# Patient Record
Sex: Female | Born: 1950
Health system: Southern US, Community
[De-identification: ages and names within clinical notes are randomized; demographics above are authoritative.]

## PROBLEM LIST (undated history)

## (undated) DIAGNOSIS — C801 Malignant (primary) neoplasm, unspecified: Secondary | ICD-10-CM

## (undated) DIAGNOSIS — E0500 Thyrotoxicosis with diffuse goiter without thyrotoxic crisis or storm: Secondary | ICD-10-CM

## (undated) DIAGNOSIS — G4700 Insomnia, unspecified: Secondary | ICD-10-CM

## (undated) DIAGNOSIS — M1990 Unspecified osteoarthritis, unspecified site: Secondary | ICD-10-CM

## (undated) DIAGNOSIS — M549 Dorsalgia, unspecified: Secondary | ICD-10-CM

## (undated) DIAGNOSIS — E0590 Thyrotoxicosis, unspecified without thyrotoxic crisis or storm: Secondary | ICD-10-CM

## (undated) DIAGNOSIS — G43909 Migraine, unspecified, not intractable, without status migrainosus: Secondary | ICD-10-CM

## (undated) HISTORY — PX: ABDOMINAL HYSTERECTOMY: SHX81

## (undated) HISTORY — PX: TONSILLECTOMY: SUR1361

## (undated) HISTORY — PX: TYMPANOSTOMY TUBE PLACEMENT: SHX32

## (undated) HISTORY — PX: ELBOW FRACTURE SURGERY: SHX616

---

## 1997-10-24 ENCOUNTER — Observation Stay (HOSPITAL_COMMUNITY): Admission: RE | Admit: 1997-10-24 | Discharge: 1997-10-25 | Payer: Self-pay | Admitting: Obstetrics & Gynecology

## 1998-10-31 ENCOUNTER — Ambulatory Visit (HOSPITAL_BASED_OUTPATIENT_CLINIC_OR_DEPARTMENT_OTHER): Admission: RE | Admit: 1998-10-31 | Discharge: 1998-10-31 | Payer: Self-pay | Admitting: *Deleted

## 1999-10-13 ENCOUNTER — Ambulatory Visit (HOSPITAL_COMMUNITY): Admission: RE | Admit: 1999-10-13 | Discharge: 1999-10-13 | Payer: Self-pay | Admitting: Obstetrics and Gynecology

## 1999-10-13 ENCOUNTER — Encounter: Payer: Self-pay | Admitting: Obstetrics and Gynecology

## 1999-10-13 ENCOUNTER — Encounter (INDEPENDENT_AMBULATORY_CARE_PROVIDER_SITE_OTHER): Payer: Self-pay | Admitting: Specialist

## 2002-06-06 ENCOUNTER — Other Ambulatory Visit: Admission: RE | Admit: 2002-06-06 | Discharge: 2002-06-06 | Payer: Self-pay | Admitting: Obstetrics & Gynecology

## 2003-12-12 ENCOUNTER — Other Ambulatory Visit: Admission: RE | Admit: 2003-12-12 | Discharge: 2003-12-12 | Payer: Self-pay | Admitting: Obstetrics & Gynecology

## 2005-01-12 ENCOUNTER — Other Ambulatory Visit: Admission: RE | Admit: 2005-01-12 | Discharge: 2005-01-12 | Payer: Self-pay | Admitting: Obstetrics & Gynecology

## 2007-06-20 ENCOUNTER — Ambulatory Visit: Payer: Self-pay | Admitting: Internal Medicine

## 2007-07-04 ENCOUNTER — Ambulatory Visit: Payer: Self-pay | Admitting: Internal Medicine

## 2007-07-04 ENCOUNTER — Encounter: Payer: Self-pay | Admitting: Internal Medicine

## 2010-07-28 ENCOUNTER — Encounter
Admission: RE | Admit: 2010-07-28 | Discharge: 2010-09-23 | Payer: Self-pay | Source: Home / Self Care | Attending: Endocrinology | Admitting: Endocrinology

## 2014-04-24 DIAGNOSIS — I8001 Phlebitis and thrombophlebitis of superficial vessels of right lower extremity: Secondary | ICD-10-CM | POA: Insufficient documentation

## 2014-04-25 ENCOUNTER — Encounter: Payer: Self-pay | Admitting: Internal Medicine

## 2014-05-11 ENCOUNTER — Other Ambulatory Visit (HOSPITAL_COMMUNITY): Payer: Self-pay | Admitting: Family Medicine

## 2014-05-11 ENCOUNTER — Ambulatory Visit (HOSPITAL_COMMUNITY)
Admission: RE | Admit: 2014-05-11 | Discharge: 2014-05-11 | Disposition: A | Discharge status: Home / Self Care | Payer: BC Managed Care – PPO | Source: Ambulatory Visit | Attending: Family Medicine | Admitting: Family Medicine

## 2014-05-11 DIAGNOSIS — I82819 Embolism and thrombosis of superficial veins of unspecified lower extremities: Secondary | ICD-10-CM | POA: Insufficient documentation

## 2014-05-11 DIAGNOSIS — M79609 Pain in unspecified limb: Secondary | ICD-10-CM

## 2014-05-11 DIAGNOSIS — M7989 Other specified soft tissue disorders: Secondary | ICD-10-CM

## 2014-05-11 DIAGNOSIS — M79604 Pain in right leg: Secondary | ICD-10-CM

## 2014-05-11 DIAGNOSIS — M25569 Pain in unspecified knee: Secondary | ICD-10-CM | POA: Diagnosis present

## 2014-05-11 NOTE — Progress Notes (Signed)
*  PRELIMINARY RESULTS* Vascular Ultrasound Right lower extremity venous duplex has been completed.  Preliminary findings: No evidence of DVT. There is superficial thrombosis of a varicosity off of the greater saphenous vein at the medial knee area.   Called results to Dr. Laqueta Linden office. He will contact patient with further instructions, but ok to let patient leave.  Landry Mellow, RDMS, RVT  05/11/2014, 4:04 PM

## 2015-06-07 DIAGNOSIS — Z23 Encounter for immunization: Secondary | ICD-10-CM | POA: Diagnosis not present

## 2015-08-08 DIAGNOSIS — Z1231 Encounter for screening mammogram for malignant neoplasm of breast: Secondary | ICD-10-CM | POA: Diagnosis not present

## 2015-08-08 DIAGNOSIS — Z1272 Encounter for screening for malignant neoplasm of vagina: Secondary | ICD-10-CM | POA: Diagnosis not present

## 2015-08-08 DIAGNOSIS — Z01419 Encounter for gynecological examination (general) (routine) without abnormal findings: Secondary | ICD-10-CM | POA: Diagnosis not present

## 2015-08-08 DIAGNOSIS — Z6823 Body mass index (BMI) 23.0-23.9, adult: Secondary | ICD-10-CM | POA: Diagnosis not present

## 2015-08-08 DIAGNOSIS — B373 Candidiasis of vulva and vagina: Secondary | ICD-10-CM | POA: Diagnosis not present

## 2015-08-27 DIAGNOSIS — Z85828 Personal history of other malignant neoplasm of skin: Secondary | ICD-10-CM | POA: Diagnosis not present

## 2015-08-27 DIAGNOSIS — D225 Melanocytic nevi of trunk: Secondary | ICD-10-CM | POA: Diagnosis not present

## 2015-08-27 DIAGNOSIS — C44519 Basal cell carcinoma of skin of other part of trunk: Secondary | ICD-10-CM | POA: Diagnosis not present

## 2015-08-27 DIAGNOSIS — L814 Other melanin hyperpigmentation: Secondary | ICD-10-CM | POA: Diagnosis not present

## 2015-08-27 DIAGNOSIS — D485 Neoplasm of uncertain behavior of skin: Secondary | ICD-10-CM | POA: Diagnosis not present

## 2015-08-27 DIAGNOSIS — L821 Other seborrheic keratosis: Secondary | ICD-10-CM | POA: Diagnosis not present

## 2015-08-30 ENCOUNTER — Telehealth: Payer: Self-pay | Admitting: Internal Medicine

## 2015-08-30 DIAGNOSIS — I8311 Varicose veins of right lower extremity with inflammation: Secondary | ICD-10-CM | POA: Diagnosis not present

## 2015-08-30 DIAGNOSIS — I8312 Varicose veins of left lower extremity with inflammation: Secondary | ICD-10-CM | POA: Diagnosis not present

## 2015-08-30 NOTE — Telephone Encounter (Signed)
Kathleen Peters I don't see any prior colonoscopy report in Epic, or any of Dr. Nichola Sizer notes. Can you obtain her last colonoscopy report, and any associated pathology, and I can then make a recommendation. Thanks

## 2015-08-30 NOTE — Telephone Encounter (Signed)
Left a message for patient to call back. 

## 2015-08-30 NOTE — Telephone Encounter (Signed)
Patient is asking when she should have a colonoscopy. She states Dr. Olevia Perches told her Nov. 2018 but she wants to know if Dr. Havery Moros agrees with this. She has not had any health changes and does not have any family history of colon cancer. Please, advise.

## 2015-09-03 ENCOUNTER — Encounter: Payer: Self-pay | Admitting: Internal Medicine

## 2015-09-03 DIAGNOSIS — I8311 Varicose veins of right lower extremity with inflammation: Secondary | ICD-10-CM | POA: Diagnosis not present

## 2015-09-03 DIAGNOSIS — I8312 Varicose veins of left lower extremity with inflammation: Secondary | ICD-10-CM | POA: Diagnosis not present

## 2015-09-03 NOTE — Telephone Encounter (Signed)
Report is is EPIC. Pathology is in also.

## 2015-09-03 NOTE — Telephone Encounter (Signed)
Report reviewed. She had a 72mm nonadenomatous polyp in 2008. Based on the lack of adenoma or family history of colon cancer, she would next be due in 2018. If she has anxiety about waiting next year for the procedure, she can see me in clinic to discuss it further and could consider doing it this year if she is anxious about it. Thanks

## 2015-09-04 NOTE — Telephone Encounter (Signed)
Spoke with patient and she is ok with waiting to 2018.

## 2015-09-23 DIAGNOSIS — I8393 Asymptomatic varicose veins of bilateral lower extremities: Secondary | ICD-10-CM | POA: Insufficient documentation

## 2015-09-23 DIAGNOSIS — R899 Unspecified abnormal finding in specimens from other organs, systems and tissues: Secondary | ICD-10-CM | POA: Diagnosis not present

## 2015-09-23 DIAGNOSIS — R760 Raised antibody titer: Secondary | ICD-10-CM | POA: Insufficient documentation

## 2015-09-23 DIAGNOSIS — I8001 Phlebitis and thrombophlebitis of superficial vessels of right lower extremity: Secondary | ICD-10-CM | POA: Diagnosis not present

## 2015-09-23 DIAGNOSIS — D6851 Activated protein C resistance: Secondary | ICD-10-CM | POA: Diagnosis not present

## 2015-09-27 DIAGNOSIS — I8312 Varicose veins of left lower extremity with inflammation: Secondary | ICD-10-CM | POA: Diagnosis not present

## 2015-09-27 DIAGNOSIS — I8311 Varicose veins of right lower extremity with inflammation: Secondary | ICD-10-CM | POA: Diagnosis not present

## 2015-10-15 DIAGNOSIS — I8312 Varicose veins of left lower extremity with inflammation: Secondary | ICD-10-CM | POA: Diagnosis not present

## 2015-10-18 DIAGNOSIS — I83812 Varicose veins of left lower extremities with pain: Secondary | ICD-10-CM | POA: Diagnosis not present

## 2015-10-18 DIAGNOSIS — I8312 Varicose veins of left lower extremity with inflammation: Secondary | ICD-10-CM | POA: Diagnosis not present

## 2015-10-22 DIAGNOSIS — D2262 Melanocytic nevi of left upper limb, including shoulder: Secondary | ICD-10-CM | POA: Diagnosis not present

## 2015-10-22 DIAGNOSIS — L570 Actinic keratosis: Secondary | ICD-10-CM | POA: Diagnosis not present

## 2015-10-22 DIAGNOSIS — L820 Inflamed seborrheic keratosis: Secondary | ICD-10-CM | POA: Diagnosis not present

## 2015-10-22 DIAGNOSIS — D045 Carcinoma in situ of skin of trunk: Secondary | ICD-10-CM | POA: Diagnosis not present

## 2015-10-22 DIAGNOSIS — I8311 Varicose veins of right lower extremity with inflammation: Secondary | ICD-10-CM | POA: Diagnosis not present

## 2015-10-22 DIAGNOSIS — L603 Nail dystrophy: Secondary | ICD-10-CM | POA: Diagnosis not present

## 2015-10-22 DIAGNOSIS — D2239 Melanocytic nevi of other parts of face: Secondary | ICD-10-CM | POA: Diagnosis not present

## 2015-10-22 DIAGNOSIS — D485 Neoplasm of uncertain behavior of skin: Secondary | ICD-10-CM | POA: Diagnosis not present

## 2015-10-22 DIAGNOSIS — Z85828 Personal history of other malignant neoplasm of skin: Secondary | ICD-10-CM | POA: Diagnosis not present

## 2015-10-22 DIAGNOSIS — D1801 Hemangioma of skin and subcutaneous tissue: Secondary | ICD-10-CM | POA: Diagnosis not present

## 2015-10-22 DIAGNOSIS — L718 Other rosacea: Secondary | ICD-10-CM | POA: Diagnosis not present

## 2015-10-22 DIAGNOSIS — I788 Other diseases of capillaries: Secondary | ICD-10-CM | POA: Diagnosis not present

## 2015-10-22 DIAGNOSIS — L821 Other seborrheic keratosis: Secondary | ICD-10-CM | POA: Diagnosis not present

## 2015-10-24 DIAGNOSIS — I83811 Varicose veins of right lower extremities with pain: Secondary | ICD-10-CM | POA: Diagnosis not present

## 2015-10-24 DIAGNOSIS — I8311 Varicose veins of right lower extremity with inflammation: Secondary | ICD-10-CM | POA: Diagnosis not present

## 2015-10-31 DIAGNOSIS — Z85828 Personal history of other malignant neoplasm of skin: Secondary | ICD-10-CM | POA: Diagnosis not present

## 2015-10-31 DIAGNOSIS — D485 Neoplasm of uncertain behavior of skin: Secondary | ICD-10-CM | POA: Diagnosis not present

## 2015-10-31 DIAGNOSIS — D045 Carcinoma in situ of skin of trunk: Secondary | ICD-10-CM | POA: Diagnosis not present

## 2015-11-05 DIAGNOSIS — I8312 Varicose veins of left lower extremity with inflammation: Secondary | ICD-10-CM | POA: Diagnosis not present

## 2015-11-18 DIAGNOSIS — M7981 Nontraumatic hematoma of soft tissue: Secondary | ICD-10-CM | POA: Diagnosis not present

## 2015-11-27 DIAGNOSIS — I8311 Varicose veins of right lower extremity with inflammation: Secondary | ICD-10-CM | POA: Diagnosis not present

## 2015-12-09 DIAGNOSIS — H903 Sensorineural hearing loss, bilateral: Secondary | ICD-10-CM | POA: Diagnosis not present

## 2015-12-10 DIAGNOSIS — I8312 Varicose veins of left lower extremity with inflammation: Secondary | ICD-10-CM | POA: Diagnosis not present

## 2015-12-31 DIAGNOSIS — I8311 Varicose veins of right lower extremity with inflammation: Secondary | ICD-10-CM | POA: Diagnosis not present

## 2016-01-21 DIAGNOSIS — I8312 Varicose veins of left lower extremity with inflammation: Secondary | ICD-10-CM | POA: Diagnosis not present

## 2016-02-03 DIAGNOSIS — D649 Anemia, unspecified: Secondary | ICD-10-CM | POA: Diagnosis not present

## 2016-02-03 DIAGNOSIS — E559 Vitamin D deficiency, unspecified: Secondary | ICD-10-CM | POA: Diagnosis not present

## 2016-02-03 DIAGNOSIS — C4491 Basal cell carcinoma of skin, unspecified: Secondary | ICD-10-CM | POA: Diagnosis not present

## 2016-02-03 DIAGNOSIS — G43909 Migraine, unspecified, not intractable, without status migrainosus: Secondary | ICD-10-CM | POA: Diagnosis not present

## 2016-02-03 DIAGNOSIS — E119 Type 2 diabetes mellitus without complications: Secondary | ICD-10-CM | POA: Diagnosis not present

## 2016-02-03 DIAGNOSIS — M8580 Other specified disorders of bone density and structure, unspecified site: Secondary | ICD-10-CM | POA: Diagnosis not present

## 2016-02-03 DIAGNOSIS — G4700 Insomnia, unspecified: Secondary | ICD-10-CM | POA: Diagnosis not present

## 2016-02-03 DIAGNOSIS — Z1389 Encounter for screening for other disorder: Secondary | ICD-10-CM | POA: Diagnosis not present

## 2016-02-03 DIAGNOSIS — R232 Flushing: Secondary | ICD-10-CM | POA: Diagnosis not present

## 2016-02-03 DIAGNOSIS — Z0000 Encounter for general adult medical examination without abnormal findings: Secondary | ICD-10-CM | POA: Diagnosis not present

## 2016-02-03 DIAGNOSIS — Z1211 Encounter for screening for malignant neoplasm of colon: Secondary | ICD-10-CM | POA: Diagnosis not present

## 2016-02-05 DIAGNOSIS — I8311 Varicose veins of right lower extremity with inflammation: Secondary | ICD-10-CM | POA: Diagnosis not present

## 2016-02-14 DIAGNOSIS — Z1211 Encounter for screening for malignant neoplasm of colon: Secondary | ICD-10-CM | POA: Diagnosis not present

## 2016-03-27 DIAGNOSIS — H02055 Trichiasis without entropian left lower eyelid: Secondary | ICD-10-CM | POA: Diagnosis not present

## 2016-05-11 DIAGNOSIS — H02865 Hypertrichosis of left lower eyelid: Secondary | ICD-10-CM | POA: Diagnosis not present

## 2016-06-10 DIAGNOSIS — M79605 Pain in left leg: Secondary | ICD-10-CM | POA: Diagnosis not present

## 2016-07-02 DIAGNOSIS — Z23 Encounter for immunization: Secondary | ICD-10-CM | POA: Diagnosis not present

## 2016-07-28 DIAGNOSIS — Z79899 Other long term (current) drug therapy: Secondary | ICD-10-CM | POA: Diagnosis not present

## 2016-07-28 DIAGNOSIS — M545 Low back pain: Secondary | ICD-10-CM | POA: Diagnosis not present

## 2016-07-28 DIAGNOSIS — M25562 Pain in left knee: Secondary | ICD-10-CM | POA: Diagnosis not present

## 2016-07-28 DIAGNOSIS — M5416 Radiculopathy, lumbar region: Secondary | ICD-10-CM | POA: Diagnosis not present

## 2016-07-28 DIAGNOSIS — M25561 Pain in right knee: Secondary | ICD-10-CM | POA: Diagnosis not present

## 2016-08-11 DIAGNOSIS — Z1231 Encounter for screening mammogram for malignant neoplasm of breast: Secondary | ICD-10-CM | POA: Diagnosis not present

## 2016-09-15 DIAGNOSIS — L03031 Cellulitis of right toe: Secondary | ICD-10-CM | POA: Diagnosis not present

## 2016-09-15 DIAGNOSIS — M79671 Pain in right foot: Secondary | ICD-10-CM | POA: Diagnosis not present

## 2016-09-15 DIAGNOSIS — L602 Onychogryphosis: Secondary | ICD-10-CM | POA: Diagnosis not present

## 2016-09-15 DIAGNOSIS — B351 Tinea unguium: Secondary | ICD-10-CM | POA: Diagnosis not present

## 2016-09-28 DIAGNOSIS — B351 Tinea unguium: Secondary | ICD-10-CM | POA: Diagnosis not present

## 2016-09-29 DIAGNOSIS — L602 Onychogryphosis: Secondary | ICD-10-CM | POA: Diagnosis not present

## 2016-09-29 DIAGNOSIS — L03031 Cellulitis of right toe: Secondary | ICD-10-CM | POA: Diagnosis not present

## 2016-09-30 DIAGNOSIS — Z79899 Other long term (current) drug therapy: Secondary | ICD-10-CM | POA: Diagnosis not present

## 2016-10-16 DIAGNOSIS — B9789 Other viral agents as the cause of diseases classified elsewhere: Secondary | ICD-10-CM | POA: Diagnosis not present

## 2016-10-16 DIAGNOSIS — Z20828 Contact with and (suspected) exposure to other viral communicable diseases: Secondary | ICD-10-CM | POA: Diagnosis not present

## 2016-10-16 DIAGNOSIS — H02055 Trichiasis without entropian left lower eyelid: Secondary | ICD-10-CM | POA: Diagnosis not present

## 2016-10-16 DIAGNOSIS — J069 Acute upper respiratory infection, unspecified: Secondary | ICD-10-CM | POA: Diagnosis not present

## 2016-10-21 DIAGNOSIS — H02055 Trichiasis without entropian left lower eyelid: Secondary | ICD-10-CM | POA: Diagnosis not present

## 2016-10-27 DIAGNOSIS — L602 Onychogryphosis: Secondary | ICD-10-CM | POA: Diagnosis not present

## 2016-10-27 DIAGNOSIS — L02612 Cutaneous abscess of left foot: Secondary | ICD-10-CM | POA: Diagnosis not present

## 2016-10-27 DIAGNOSIS — L03032 Cellulitis of left toe: Secondary | ICD-10-CM | POA: Diagnosis not present

## 2016-10-27 DIAGNOSIS — M79675 Pain in left toe(s): Secondary | ICD-10-CM | POA: Diagnosis not present

## 2016-10-27 DIAGNOSIS — Z79899 Other long term (current) drug therapy: Secondary | ICD-10-CM | POA: Diagnosis not present

## 2016-11-10 DIAGNOSIS — L03032 Cellulitis of left toe: Secondary | ICD-10-CM | POA: Diagnosis not present

## 2016-12-31 DIAGNOSIS — H02055 Trichiasis without entropian left lower eyelid: Secondary | ICD-10-CM | POA: Diagnosis not present

## 2017-01-05 DIAGNOSIS — L03031 Cellulitis of right toe: Secondary | ICD-10-CM | POA: Diagnosis not present

## 2017-01-05 DIAGNOSIS — B351 Tinea unguium: Secondary | ICD-10-CM | POA: Diagnosis not present

## 2017-01-11 DIAGNOSIS — H02055 Trichiasis without entropian left lower eyelid: Secondary | ICD-10-CM | POA: Diagnosis not present

## 2017-01-14 DIAGNOSIS — Z85828 Personal history of other malignant neoplasm of skin: Secondary | ICD-10-CM | POA: Diagnosis not present

## 2017-01-14 DIAGNOSIS — L309 Dermatitis, unspecified: Secondary | ICD-10-CM | POA: Diagnosis not present

## 2017-01-14 DIAGNOSIS — B351 Tinea unguium: Secondary | ICD-10-CM | POA: Diagnosis not present

## 2017-01-14 DIAGNOSIS — D485 Neoplasm of uncertain behavior of skin: Secondary | ICD-10-CM | POA: Diagnosis not present

## 2017-02-11 DIAGNOSIS — D649 Anemia, unspecified: Secondary | ICD-10-CM | POA: Diagnosis not present

## 2017-02-11 DIAGNOSIS — Z0000 Encounter for general adult medical examination without abnormal findings: Secondary | ICD-10-CM | POA: Diagnosis not present

## 2017-02-11 DIAGNOSIS — E119 Type 2 diabetes mellitus without complications: Secondary | ICD-10-CM | POA: Diagnosis not present

## 2017-02-11 DIAGNOSIS — L609 Nail disorder, unspecified: Secondary | ICD-10-CM | POA: Diagnosis not present

## 2017-02-11 DIAGNOSIS — M899 Disorder of bone, unspecified: Secondary | ICD-10-CM | POA: Diagnosis not present

## 2017-02-11 DIAGNOSIS — G43909 Migraine, unspecified, not intractable, without status migrainosus: Secondary | ICD-10-CM | POA: Diagnosis not present

## 2017-02-11 DIAGNOSIS — M8588 Other specified disorders of bone density and structure, other site: Secondary | ICD-10-CM | POA: Diagnosis not present

## 2017-02-11 DIAGNOSIS — R252 Cramp and spasm: Secondary | ICD-10-CM | POA: Diagnosis not present

## 2017-02-11 DIAGNOSIS — G4700 Insomnia, unspecified: Secondary | ICD-10-CM | POA: Diagnosis not present

## 2017-02-11 DIAGNOSIS — R232 Flushing: Secondary | ICD-10-CM | POA: Diagnosis not present

## 2017-02-11 DIAGNOSIS — Z85828 Personal history of other malignant neoplasm of skin: Secondary | ICD-10-CM | POA: Diagnosis not present

## 2017-02-16 DIAGNOSIS — H903 Sensorineural hearing loss, bilateral: Secondary | ICD-10-CM | POA: Diagnosis not present

## 2017-02-16 DIAGNOSIS — H838X3 Other specified diseases of inner ear, bilateral: Secondary | ICD-10-CM | POA: Diagnosis not present

## 2017-04-16 DIAGNOSIS — Z23 Encounter for immunization: Secondary | ICD-10-CM | POA: Diagnosis not present

## 2017-04-27 DIAGNOSIS — H52203 Unspecified astigmatism, bilateral: Secondary | ICD-10-CM | POA: Diagnosis not present

## 2017-04-27 DIAGNOSIS — H02055 Trichiasis without entropian left lower eyelid: Secondary | ICD-10-CM | POA: Diagnosis not present

## 2017-04-27 DIAGNOSIS — H2513 Age-related nuclear cataract, bilateral: Secondary | ICD-10-CM | POA: Diagnosis not present

## 2017-07-08 DIAGNOSIS — L819 Disorder of pigmentation, unspecified: Secondary | ICD-10-CM | POA: Diagnosis not present

## 2017-07-08 DIAGNOSIS — D485 Neoplasm of uncertain behavior of skin: Secondary | ICD-10-CM | POA: Diagnosis not present

## 2017-07-08 DIAGNOSIS — Z85828 Personal history of other malignant neoplasm of skin: Secondary | ICD-10-CM | POA: Diagnosis not present

## 2017-07-08 DIAGNOSIS — D1801 Hemangioma of skin and subcutaneous tissue: Secondary | ICD-10-CM | POA: Diagnosis not present

## 2017-07-08 DIAGNOSIS — L821 Other seborrheic keratosis: Secondary | ICD-10-CM | POA: Diagnosis not present

## 2017-07-08 DIAGNOSIS — C44319 Basal cell carcinoma of skin of other parts of face: Secondary | ICD-10-CM | POA: Diagnosis not present

## 2017-07-08 DIAGNOSIS — L570 Actinic keratosis: Secondary | ICD-10-CM | POA: Diagnosis not present

## 2017-07-08 DIAGNOSIS — L601 Onycholysis: Secondary | ICD-10-CM | POA: Diagnosis not present

## 2017-07-14 ENCOUNTER — Encounter: Payer: Self-pay | Admitting: Gastroenterology

## 2017-07-14 DIAGNOSIS — I788 Other diseases of capillaries: Secondary | ICD-10-CM | POA: Diagnosis not present

## 2017-07-26 DIAGNOSIS — D126 Benign neoplasm of colon, unspecified: Secondary | ICD-10-CM | POA: Diagnosis not present

## 2017-07-26 DIAGNOSIS — K6289 Other specified diseases of anus and rectum: Secondary | ICD-10-CM | POA: Diagnosis not present

## 2017-07-26 DIAGNOSIS — Z1211 Encounter for screening for malignant neoplasm of colon: Secondary | ICD-10-CM | POA: Diagnosis not present

## 2017-07-27 ENCOUNTER — Encounter: Payer: Self-pay | Admitting: Gastroenterology

## 2017-07-29 DIAGNOSIS — Z1211 Encounter for screening for malignant neoplasm of colon: Secondary | ICD-10-CM | POA: Diagnosis not present

## 2017-07-29 DIAGNOSIS — D126 Benign neoplasm of colon, unspecified: Secondary | ICD-10-CM | POA: Diagnosis not present

## 2017-08-05 DIAGNOSIS — C44319 Basal cell carcinoma of skin of other parts of face: Secondary | ICD-10-CM | POA: Diagnosis not present

## 2017-08-05 DIAGNOSIS — Z85828 Personal history of other malignant neoplasm of skin: Secondary | ICD-10-CM | POA: Diagnosis not present

## 2017-08-12 DIAGNOSIS — Z1231 Encounter for screening mammogram for malignant neoplasm of breast: Secondary | ICD-10-CM | POA: Diagnosis not present

## 2017-08-12 DIAGNOSIS — N958 Other specified menopausal and perimenopausal disorders: Secondary | ICD-10-CM | POA: Diagnosis not present

## 2017-08-12 DIAGNOSIS — Z6823 Body mass index (BMI) 23.0-23.9, adult: Secondary | ICD-10-CM | POA: Diagnosis not present

## 2017-08-12 DIAGNOSIS — M8588 Other specified disorders of bone density and structure, other site: Secondary | ICD-10-CM | POA: Diagnosis not present

## 2017-08-12 DIAGNOSIS — Z4802 Encounter for removal of sutures: Secondary | ICD-10-CM | POA: Diagnosis not present

## 2017-08-12 DIAGNOSIS — M545 Low back pain: Secondary | ICD-10-CM | POA: Diagnosis not present

## 2017-08-12 DIAGNOSIS — Z124 Encounter for screening for malignant neoplasm of cervix: Secondary | ICD-10-CM | POA: Diagnosis not present

## 2017-08-12 DIAGNOSIS — Z1272 Encounter for screening for malignant neoplasm of vagina: Secondary | ICD-10-CM | POA: Diagnosis not present

## 2017-10-12 DIAGNOSIS — M545 Low back pain: Secondary | ICD-10-CM | POA: Diagnosis not present

## 2017-10-14 DIAGNOSIS — M545 Low back pain: Secondary | ICD-10-CM | POA: Diagnosis not present

## 2017-10-20 DIAGNOSIS — M545 Low back pain: Secondary | ICD-10-CM | POA: Diagnosis not present

## 2017-10-26 DIAGNOSIS — M545 Low back pain: Secondary | ICD-10-CM | POA: Diagnosis not present

## 2017-10-27 DIAGNOSIS — M545 Low back pain: Secondary | ICD-10-CM | POA: Diagnosis not present

## 2017-11-02 DIAGNOSIS — M545 Low back pain: Secondary | ICD-10-CM | POA: Diagnosis not present

## 2017-11-04 DIAGNOSIS — M545 Low back pain: Secondary | ICD-10-CM | POA: Diagnosis not present

## 2017-11-08 DIAGNOSIS — M545 Low back pain: Secondary | ICD-10-CM | POA: Diagnosis not present

## 2017-11-11 DIAGNOSIS — M545 Low back pain: Secondary | ICD-10-CM | POA: Diagnosis not present

## 2017-11-12 DIAGNOSIS — H2513 Age-related nuclear cataract, bilateral: Secondary | ICD-10-CM | POA: Diagnosis not present

## 2017-11-12 DIAGNOSIS — M25551 Pain in right hip: Secondary | ICD-10-CM | POA: Diagnosis not present

## 2017-11-19 DIAGNOSIS — M545 Low back pain: Secondary | ICD-10-CM | POA: Diagnosis not present

## 2017-11-23 DIAGNOSIS — M545 Low back pain: Secondary | ICD-10-CM | POA: Diagnosis not present

## 2017-12-07 DIAGNOSIS — M545 Low back pain: Secondary | ICD-10-CM | POA: Diagnosis not present

## 2017-12-09 DIAGNOSIS — M545 Low back pain: Secondary | ICD-10-CM | POA: Diagnosis not present

## 2017-12-14 DIAGNOSIS — M545 Low back pain: Secondary | ICD-10-CM | POA: Diagnosis not present

## 2017-12-16 ENCOUNTER — Other Ambulatory Visit: Payer: Self-pay | Admitting: Family Medicine

## 2017-12-16 DIAGNOSIS — G894 Chronic pain syndrome: Secondary | ICD-10-CM

## 2017-12-28 ENCOUNTER — Ambulatory Visit
Admission: RE | Admit: 2017-12-28 | Discharge: 2017-12-28 | Disposition: A | Discharge status: Home / Self Care | Payer: Medicare Other | Source: Ambulatory Visit | Attending: Family Medicine | Admitting: Family Medicine

## 2017-12-28 ENCOUNTER — Other Ambulatory Visit: Payer: Self-pay | Admitting: Family Medicine

## 2017-12-28 DIAGNOSIS — Z9629 Presence of other otological and audiological implants: Secondary | ICD-10-CM | POA: Diagnosis not present

## 2017-12-28 DIAGNOSIS — T162XXA Foreign body in left ear, initial encounter: Secondary | ICD-10-CM

## 2017-12-28 DIAGNOSIS — G894 Chronic pain syndrome: Secondary | ICD-10-CM

## 2017-12-31 ENCOUNTER — Other Ambulatory Visit: Payer: Self-pay | Admitting: Family Medicine

## 2017-12-31 DIAGNOSIS — M5440 Lumbago with sciatica, unspecified side: Secondary | ICD-10-CM

## 2018-01-24 DIAGNOSIS — L821 Other seborrheic keratosis: Secondary | ICD-10-CM | POA: Diagnosis not present

## 2018-01-24 DIAGNOSIS — M545 Low back pain: Secondary | ICD-10-CM | POA: Diagnosis not present

## 2018-01-24 DIAGNOSIS — J069 Acute upper respiratory infection, unspecified: Secondary | ICD-10-CM | POA: Diagnosis not present

## 2018-01-27 ENCOUNTER — Other Ambulatory Visit: Payer: Medicare Other

## 2018-02-03 ENCOUNTER — Other Ambulatory Visit (HOSPITAL_COMMUNITY): Payer: Self-pay | Admitting: *Deleted

## 2018-02-03 ENCOUNTER — Ambulatory Visit (HOSPITAL_COMMUNITY)
Admission: RE | Admit: 2018-02-03 | Discharge: 2018-02-03 | Disposition: A | Discharge status: Home / Self Care | Payer: Medicare Other | Source: Ambulatory Visit | Attending: Internal Medicine | Admitting: Internal Medicine

## 2018-02-03 DIAGNOSIS — R05 Cough: Secondary | ICD-10-CM | POA: Diagnosis not present

## 2018-02-03 DIAGNOSIS — J208 Acute bronchitis due to other specified organisms: Secondary | ICD-10-CM | POA: Diagnosis not present

## 2018-02-03 DIAGNOSIS — R059 Cough, unspecified: Secondary | ICD-10-CM

## 2018-02-07 ENCOUNTER — Ambulatory Visit
Admission: RE | Admit: 2018-02-07 | Discharge: 2018-02-07 | Disposition: A | Discharge status: Home / Self Care | Payer: Medicare Other | Source: Ambulatory Visit | Attending: Family Medicine | Admitting: Family Medicine

## 2018-02-07 DIAGNOSIS — M5440 Lumbago with sciatica, unspecified side: Secondary | ICD-10-CM

## 2018-02-07 DIAGNOSIS — M48061 Spinal stenosis, lumbar region without neurogenic claudication: Secondary | ICD-10-CM | POA: Diagnosis not present

## 2018-02-07 MED ORDER — DIAZEPAM 5 MG PO TABS
5.0000 mg | ORAL_TABLET | Freq: Once | ORAL | Status: AC
  Administered 2018-02-07: 5 mg via ORAL

## 2018-02-07 MED ORDER — IOPAMIDOL (ISOVUE-M 200) INJECTION 41%
15.0000 mL | Freq: Once | INTRAMUSCULAR | Status: AC
  Administered 2018-02-07: 15 mL via INTRATHECAL

## 2018-02-07 NOTE — Progress Notes (Signed)
Patient states he has been off Relpax for at least the past two days.  Brita Romp, RN

## 2018-02-07 NOTE — Discharge Instructions (Signed)
Myelogram Discharge Instructions  1. Go home and rest quietly for the next 24 hours.  It is important to lie flat for the next 24 hours.  Get up only to go to the restroom.  You may lie in the bed or on a couch on your back, your stomach, your left side or your right side.  You may have one pillow under your head.  You may have pillows between your knees while you are on your side or under your knees while you are on your back.  2. DO NOT drive today.  Recline the seat as far back as it will go, while still wearing your seat belt, on the way home.  3. You may get up to go to the bathroom as needed.  You may sit up for 10 minutes to eat.  You may resume your normal diet and medications unless otherwise indicated.  Drink lots of extra fluids today and tomorrow.  4. The incidence of headache, nausea, or vomiting is about 5% (one in 20 patients).  If you develop a headache, lie flat and drink plenty of fluids until the headache goes away.  Caffeinated beverages may be helpful.  If you develop severe nausea and vomiting or a headache that does not go away with flat bed rest, call [phone].  5. You may resume normal activities after your 24 hours of bed rest is over; however, do not exert yourself strongly or do any heavy lifting tomorrow. If when you get up you have a headache when standing, go back to bed and force fluids for another 24 hours.  6. Call your physician for a follow-up appointment.  The results of your myelogram will be sent directly to your physician by the following day.  7. If you have any questions or if complications develop after you arrive home, please call [phone].  Discharge instructions have been explained to the patient.  The patient, or the person responsible for the patient, fully understands these instructions.  YOU MAY RESTART YOUR RELPAX TOMORROW 02/08/2018 AT 10:30AM.

## 2018-02-09 ENCOUNTER — Other Ambulatory Visit: Payer: Self-pay | Admitting: Family Medicine

## 2018-02-09 DIAGNOSIS — N289 Disorder of kidney and ureter, unspecified: Secondary | ICD-10-CM

## 2018-02-09 DIAGNOSIS — K769 Liver disease, unspecified: Secondary | ICD-10-CM

## 2018-02-10 ENCOUNTER — Ambulatory Visit
Admission: RE | Admit: 2018-02-10 | Discharge: 2018-02-10 | Disposition: A | Discharge status: Home / Self Care | Payer: Medicare Other | Source: Ambulatory Visit | Attending: Family Medicine | Admitting: Family Medicine

## 2018-02-10 DIAGNOSIS — K7689 Other specified diseases of liver: Secondary | ICD-10-CM | POA: Diagnosis not present

## 2018-02-10 DIAGNOSIS — K769 Liver disease, unspecified: Secondary | ICD-10-CM

## 2018-02-10 DIAGNOSIS — N289 Disorder of kidney and ureter, unspecified: Secondary | ICD-10-CM

## 2018-02-10 MED ORDER — IOPAMIDOL (ISOVUE-300) INJECTION 61%
100.0000 mL | Freq: Once | INTRAVENOUS | Status: AC | PRN
  Administered 2018-02-10: 50 mL via INTRAVENOUS

## 2018-02-11 DIAGNOSIS — Z1211 Encounter for screening for malignant neoplasm of colon: Secondary | ICD-10-CM | POA: Diagnosis not present

## 2018-02-11 DIAGNOSIS — G43909 Migraine, unspecified, not intractable, without status migrainosus: Secondary | ICD-10-CM | POA: Diagnosis not present

## 2018-02-11 DIAGNOSIS — D649 Anemia, unspecified: Secondary | ICD-10-CM | POA: Diagnosis not present

## 2018-02-11 DIAGNOSIS — Z1389 Encounter for screening for other disorder: Secondary | ICD-10-CM | POA: Diagnosis not present

## 2018-02-11 DIAGNOSIS — E7800 Pure hypercholesterolemia, unspecified: Secondary | ICD-10-CM | POA: Diagnosis not present

## 2018-02-11 DIAGNOSIS — K769 Liver disease, unspecified: Secondary | ICD-10-CM | POA: Diagnosis not present

## 2018-02-11 DIAGNOSIS — R232 Flushing: Secondary | ICD-10-CM | POA: Diagnosis not present

## 2018-02-11 DIAGNOSIS — Z0000 Encounter for general adult medical examination without abnormal findings: Secondary | ICD-10-CM | POA: Diagnosis not present

## 2018-02-11 DIAGNOSIS — N289 Disorder of kidney and ureter, unspecified: Secondary | ICD-10-CM | POA: Diagnosis not present

## 2018-02-11 DIAGNOSIS — M8588 Other specified disorders of bone density and structure, other site: Secondary | ICD-10-CM | POA: Diagnosis not present

## 2018-02-11 DIAGNOSIS — E1169 Type 2 diabetes mellitus with other specified complication: Secondary | ICD-10-CM | POA: Diagnosis not present

## 2018-02-11 DIAGNOSIS — G4700 Insomnia, unspecified: Secondary | ICD-10-CM | POA: Diagnosis not present

## 2018-03-01 DIAGNOSIS — H838X3 Other specified diseases of inner ear, bilateral: Secondary | ICD-10-CM | POA: Diagnosis not present

## 2018-03-01 DIAGNOSIS — H903 Sensorineural hearing loss, bilateral: Secondary | ICD-10-CM | POA: Diagnosis not present

## 2018-03-17 DIAGNOSIS — R7989 Other specified abnormal findings of blood chemistry: Secondary | ICD-10-CM | POA: Diagnosis not present

## 2018-03-17 DIAGNOSIS — R946 Abnormal results of thyroid function studies: Secondary | ICD-10-CM | POA: Diagnosis not present

## 2018-03-29 DIAGNOSIS — N281 Cyst of kidney, acquired: Secondary | ICD-10-CM | POA: Diagnosis not present

## 2018-04-07 DIAGNOSIS — K7689 Other specified diseases of liver: Secondary | ICD-10-CM | POA: Diagnosis not present

## 2018-04-07 DIAGNOSIS — K5900 Constipation, unspecified: Secondary | ICD-10-CM | POA: Diagnosis not present

## 2018-04-07 DIAGNOSIS — Z86010 Personal history of colonic polyps: Secondary | ICD-10-CM | POA: Diagnosis not present

## 2018-04-12 DIAGNOSIS — M545 Low back pain: Secondary | ICD-10-CM | POA: Diagnosis not present

## 2018-04-22 DIAGNOSIS — M47816 Spondylosis without myelopathy or radiculopathy, lumbar region: Secondary | ICD-10-CM | POA: Diagnosis not present

## 2018-05-03 DIAGNOSIS — H2513 Age-related nuclear cataract, bilateral: Secondary | ICD-10-CM | POA: Diagnosis not present

## 2018-05-05 DIAGNOSIS — M47816 Spondylosis without myelopathy or radiculopathy, lumbar region: Secondary | ICD-10-CM | POA: Diagnosis not present

## 2018-05-10 DIAGNOSIS — M47816 Spondylosis without myelopathy or radiculopathy, lumbar region: Secondary | ICD-10-CM | POA: Diagnosis not present

## 2018-05-11 DIAGNOSIS — M47816 Spondylosis without myelopathy or radiculopathy, lumbar region: Secondary | ICD-10-CM | POA: Diagnosis not present

## 2018-05-17 DIAGNOSIS — M47816 Spondylosis without myelopathy or radiculopathy, lumbar region: Secondary | ICD-10-CM | POA: Diagnosis not present

## 2018-05-18 DIAGNOSIS — Z23 Encounter for immunization: Secondary | ICD-10-CM | POA: Diagnosis not present

## 2018-05-19 DIAGNOSIS — M47816 Spondylosis without myelopathy or radiculopathy, lumbar region: Secondary | ICD-10-CM | POA: Diagnosis not present

## 2018-05-27 DIAGNOSIS — M47816 Spondylosis without myelopathy or radiculopathy, lumbar region: Secondary | ICD-10-CM | POA: Diagnosis not present

## 2018-05-31 DIAGNOSIS — M47816 Spondylosis without myelopathy or radiculopathy, lumbar region: Secondary | ICD-10-CM | POA: Diagnosis not present

## 2018-06-07 DIAGNOSIS — M47816 Spondylosis without myelopathy or radiculopathy, lumbar region: Secondary | ICD-10-CM | POA: Diagnosis not present

## 2018-06-09 DIAGNOSIS — H25811 Combined forms of age-related cataract, right eye: Secondary | ICD-10-CM | POA: Diagnosis not present

## 2018-06-09 DIAGNOSIS — H2511 Age-related nuclear cataract, right eye: Secondary | ICD-10-CM | POA: Diagnosis not present

## 2018-06-14 DIAGNOSIS — M47816 Spondylosis without myelopathy or radiculopathy, lumbar region: Secondary | ICD-10-CM | POA: Diagnosis not present

## 2018-06-16 DIAGNOSIS — M47816 Spondylosis without myelopathy or radiculopathy, lumbar region: Secondary | ICD-10-CM | POA: Diagnosis not present

## 2018-06-21 DIAGNOSIS — M47816 Spondylosis without myelopathy or radiculopathy, lumbar region: Secondary | ICD-10-CM | POA: Diagnosis not present

## 2018-06-23 DIAGNOSIS — M47816 Spondylosis without myelopathy or radiculopathy, lumbar region: Secondary | ICD-10-CM | POA: Diagnosis not present

## 2018-07-11 DIAGNOSIS — L821 Other seborrheic keratosis: Secondary | ICD-10-CM | POA: Diagnosis not present

## 2018-07-11 DIAGNOSIS — Z85828 Personal history of other malignant neoplasm of skin: Secondary | ICD-10-CM | POA: Diagnosis not present

## 2018-07-11 DIAGNOSIS — L718 Other rosacea: Secondary | ICD-10-CM | POA: Diagnosis not present

## 2018-07-11 DIAGNOSIS — D2272 Melanocytic nevi of left lower limb, including hip: Secondary | ICD-10-CM | POA: Diagnosis not present

## 2018-07-11 DIAGNOSIS — D1801 Hemangioma of skin and subcutaneous tissue: Secondary | ICD-10-CM | POA: Diagnosis not present

## 2018-07-11 DIAGNOSIS — L814 Other melanin hyperpigmentation: Secondary | ICD-10-CM | POA: Diagnosis not present

## 2018-07-11 DIAGNOSIS — L603 Nail dystrophy: Secondary | ICD-10-CM | POA: Diagnosis not present

## 2018-08-09 DIAGNOSIS — M8588 Other specified disorders of bone density and structure, other site: Secondary | ICD-10-CM | POA: Diagnosis not present

## 2018-08-09 DIAGNOSIS — E1169 Type 2 diabetes mellitus with other specified complication: Secondary | ICD-10-CM | POA: Diagnosis not present

## 2018-08-09 DIAGNOSIS — K7689 Other specified diseases of liver: Secondary | ICD-10-CM | POA: Diagnosis not present

## 2018-08-09 DIAGNOSIS — E7800 Pure hypercholesterolemia, unspecified: Secondary | ICD-10-CM | POA: Diagnosis not present

## 2018-08-09 DIAGNOSIS — R232 Flushing: Secondary | ICD-10-CM | POA: Diagnosis not present

## 2018-08-09 DIAGNOSIS — D649 Anemia, unspecified: Secondary | ICD-10-CM | POA: Diagnosis not present

## 2018-08-09 DIAGNOSIS — M48061 Spinal stenosis, lumbar region without neurogenic claudication: Secondary | ICD-10-CM | POA: Diagnosis not present

## 2018-08-09 DIAGNOSIS — G43909 Migraine, unspecified, not intractable, without status migrainosus: Secondary | ICD-10-CM | POA: Diagnosis not present

## 2018-08-09 DIAGNOSIS — G4700 Insomnia, unspecified: Secondary | ICD-10-CM | POA: Diagnosis not present

## 2018-09-13 DIAGNOSIS — M47816 Spondylosis without myelopathy or radiculopathy, lumbar region: Secondary | ICD-10-CM | POA: Diagnosis not present

## 2018-09-26 DIAGNOSIS — Z1231 Encounter for screening mammogram for malignant neoplasm of breast: Secondary | ICD-10-CM | POA: Diagnosis not present

## 2018-10-04 DIAGNOSIS — I8312 Varicose veins of left lower extremity with inflammation: Secondary | ICD-10-CM | POA: Diagnosis not present

## 2018-10-04 DIAGNOSIS — I8311 Varicose veins of right lower extremity with inflammation: Secondary | ICD-10-CM | POA: Diagnosis not present

## 2018-10-05 DIAGNOSIS — H02055 Trichiasis without entropian left lower eyelid: Secondary | ICD-10-CM | POA: Diagnosis not present

## 2018-10-13 DIAGNOSIS — Z85828 Personal history of other malignant neoplasm of skin: Secondary | ICD-10-CM | POA: Diagnosis not present

## 2018-10-13 DIAGNOSIS — L603 Nail dystrophy: Secondary | ICD-10-CM | POA: Diagnosis not present

## 2018-10-20 DIAGNOSIS — I8312 Varicose veins of left lower extremity with inflammation: Secondary | ICD-10-CM | POA: Diagnosis not present

## 2018-10-20 DIAGNOSIS — I8311 Varicose veins of right lower extremity with inflammation: Secondary | ICD-10-CM | POA: Diagnosis not present

## 2018-10-28 DIAGNOSIS — I8311 Varicose veins of right lower extremity with inflammation: Secondary | ICD-10-CM | POA: Diagnosis not present

## 2018-11-07 DIAGNOSIS — H2512 Age-related nuclear cataract, left eye: Secondary | ICD-10-CM | POA: Diagnosis not present

## 2018-11-30 DIAGNOSIS — Z85828 Personal history of other malignant neoplasm of skin: Secondary | ICD-10-CM | POA: Diagnosis not present

## 2018-11-30 DIAGNOSIS — L603 Nail dystrophy: Secondary | ICD-10-CM | POA: Diagnosis not present

## 2018-12-21 DIAGNOSIS — H02055 Trichiasis without entropian left lower eyelid: Secondary | ICD-10-CM | POA: Diagnosis not present

## 2018-12-29 DIAGNOSIS — H2512 Age-related nuclear cataract, left eye: Secondary | ICD-10-CM | POA: Diagnosis not present

## 2018-12-29 DIAGNOSIS — H25812 Combined forms of age-related cataract, left eye: Secondary | ICD-10-CM | POA: Diagnosis not present

## 2019-01-03 DIAGNOSIS — I8311 Varicose veins of right lower extremity with inflammation: Secondary | ICD-10-CM | POA: Diagnosis not present

## 2019-01-19 DIAGNOSIS — M47816 Spondylosis without myelopathy or radiculopathy, lumbar region: Secondary | ICD-10-CM | POA: Diagnosis not present

## 2019-01-27 DIAGNOSIS — Z961 Presence of intraocular lens: Secondary | ICD-10-CM | POA: Diagnosis not present

## 2019-01-27 DIAGNOSIS — H04222 Epiphora due to insufficient drainage, left lacrimal gland: Secondary | ICD-10-CM | POA: Diagnosis not present

## 2019-02-06 ENCOUNTER — Other Ambulatory Visit: Payer: Self-pay | Admitting: Gastroenterology

## 2019-02-06 DIAGNOSIS — K7689 Other specified diseases of liver: Secondary | ICD-10-CM

## 2019-02-23 ENCOUNTER — Other Ambulatory Visit: Payer: Self-pay

## 2019-02-23 ENCOUNTER — Ambulatory Visit
Admission: RE | Admit: 2019-02-23 | Discharge: 2019-02-23 | Disposition: A | Discharge status: Home / Self Care | Payer: Medicare Other | Source: Ambulatory Visit | Attending: Gastroenterology | Admitting: Gastroenterology

## 2019-02-23 DIAGNOSIS — C4490 Unspecified malignant neoplasm of skin, unspecified: Secondary | ICD-10-CM | POA: Diagnosis not present

## 2019-02-23 DIAGNOSIS — N281 Cyst of kidney, acquired: Secondary | ICD-10-CM | POA: Diagnosis not present

## 2019-02-23 DIAGNOSIS — K862 Cyst of pancreas: Secondary | ICD-10-CM | POA: Diagnosis not present

## 2019-02-23 DIAGNOSIS — K7689 Other specified diseases of liver: Secondary | ICD-10-CM | POA: Diagnosis not present

## 2019-02-23 MED ORDER — IOPAMIDOL (ISOVUE-300) INJECTION 61%
80.0000 mL | Freq: Once | INTRAVENOUS | Status: AC | PRN
  Administered 2019-02-23: 80 mL via INTRAVENOUS

## 2019-02-27 DIAGNOSIS — G4700 Insomnia, unspecified: Secondary | ICD-10-CM | POA: Diagnosis not present

## 2019-02-27 DIAGNOSIS — M8588 Other specified disorders of bone density and structure, other site: Secondary | ICD-10-CM | POA: Diagnosis not present

## 2019-02-27 DIAGNOSIS — Z1389 Encounter for screening for other disorder: Secondary | ICD-10-CM | POA: Diagnosis not present

## 2019-02-27 DIAGNOSIS — K7689 Other specified diseases of liver: Secondary | ICD-10-CM | POA: Diagnosis not present

## 2019-02-27 DIAGNOSIS — Z0000 Encounter for general adult medical examination without abnormal findings: Secondary | ICD-10-CM | POA: Diagnosis not present

## 2019-02-27 DIAGNOSIS — L608 Other nail disorders: Secondary | ICD-10-CM | POA: Diagnosis not present

## 2019-02-27 DIAGNOSIS — M48061 Spinal stenosis, lumbar region without neurogenic claudication: Secondary | ICD-10-CM | POA: Diagnosis not present

## 2019-02-27 DIAGNOSIS — E1169 Type 2 diabetes mellitus with other specified complication: Secondary | ICD-10-CM | POA: Diagnosis not present

## 2019-02-27 DIAGNOSIS — E7800 Pure hypercholesterolemia, unspecified: Secondary | ICD-10-CM | POA: Diagnosis not present

## 2019-02-27 DIAGNOSIS — Z1211 Encounter for screening for malignant neoplasm of colon: Secondary | ICD-10-CM | POA: Diagnosis not present

## 2019-02-27 DIAGNOSIS — R232 Flushing: Secondary | ICD-10-CM | POA: Diagnosis not present

## 2019-02-27 DIAGNOSIS — G43909 Migraine, unspecified, not intractable, without status migrainosus: Secondary | ICD-10-CM | POA: Diagnosis not present

## 2019-03-02 DIAGNOSIS — N281 Cyst of kidney, acquired: Secondary | ICD-10-CM | POA: Diagnosis not present

## 2019-03-02 DIAGNOSIS — K7689 Other specified diseases of liver: Secondary | ICD-10-CM | POA: Diagnosis not present

## 2019-03-02 DIAGNOSIS — K862 Cyst of pancreas: Secondary | ICD-10-CM | POA: Diagnosis not present

## 2019-03-07 DIAGNOSIS — H903 Sensorineural hearing loss, bilateral: Secondary | ICD-10-CM | POA: Diagnosis not present

## 2019-03-07 DIAGNOSIS — H838X3 Other specified diseases of inner ear, bilateral: Secondary | ICD-10-CM | POA: Diagnosis not present

## 2019-03-13 ENCOUNTER — Other Ambulatory Visit: Payer: Medicare Other

## 2019-05-02 DIAGNOSIS — M48061 Spinal stenosis, lumbar region without neurogenic claudication: Secondary | ICD-10-CM | POA: Diagnosis not present

## 2019-05-02 DIAGNOSIS — G4700 Insomnia, unspecified: Secondary | ICD-10-CM | POA: Diagnosis not present

## 2019-05-02 DIAGNOSIS — N183 Chronic kidney disease, stage 3 (moderate): Secondary | ICD-10-CM | POA: Diagnosis not present

## 2019-05-02 DIAGNOSIS — R300 Dysuria: Secondary | ICD-10-CM | POA: Diagnosis not present

## 2019-06-06 DIAGNOSIS — Z23 Encounter for immunization: Secondary | ICD-10-CM | POA: Diagnosis not present

## 2019-06-06 DIAGNOSIS — N281 Cyst of kidney, acquired: Secondary | ICD-10-CM | POA: Diagnosis not present

## 2019-06-08 DIAGNOSIS — Z20828 Contact with and (suspected) exposure to other viral communicable diseases: Secondary | ICD-10-CM | POA: Diagnosis not present

## 2019-07-06 DIAGNOSIS — L570 Actinic keratosis: Secondary | ICD-10-CM | POA: Diagnosis not present

## 2019-07-06 DIAGNOSIS — L720 Epidermal cyst: Secondary | ICD-10-CM | POA: Diagnosis not present

## 2019-07-06 DIAGNOSIS — Z85828 Personal history of other malignant neoplasm of skin: Secondary | ICD-10-CM | POA: Diagnosis not present

## 2019-07-06 DIAGNOSIS — D485 Neoplasm of uncertain behavior of skin: Secondary | ICD-10-CM | POA: Diagnosis not present

## 2019-07-06 DIAGNOSIS — D692 Other nonthrombocytopenic purpura: Secondary | ICD-10-CM | POA: Diagnosis not present

## 2019-07-06 DIAGNOSIS — D1801 Hemangioma of skin and subcutaneous tissue: Secondary | ICD-10-CM | POA: Diagnosis not present

## 2019-07-06 DIAGNOSIS — L821 Other seborrheic keratosis: Secondary | ICD-10-CM | POA: Diagnosis not present

## 2019-07-18 DIAGNOSIS — M47816 Spondylosis without myelopathy or radiculopathy, lumbar region: Secondary | ICD-10-CM | POA: Diagnosis not present

## 2019-07-28 DIAGNOSIS — L03031 Cellulitis of right toe: Secondary | ICD-10-CM | POA: Diagnosis not present

## 2019-07-28 DIAGNOSIS — B351 Tinea unguium: Secondary | ICD-10-CM | POA: Diagnosis not present

## 2019-07-28 DIAGNOSIS — M79674 Pain in right toe(s): Secondary | ICD-10-CM | POA: Diagnosis not present

## 2019-07-28 DIAGNOSIS — L02611 Cutaneous abscess of right foot: Secondary | ICD-10-CM | POA: Diagnosis not present

## 2019-09-05 DIAGNOSIS — S90851A Superficial foreign body, right foot, initial encounter: Secondary | ICD-10-CM | POA: Diagnosis not present

## 2019-09-05 DIAGNOSIS — L259 Unspecified contact dermatitis, unspecified cause: Secondary | ICD-10-CM | POA: Diagnosis not present

## 2019-09-05 DIAGNOSIS — R232 Flushing: Secondary | ICD-10-CM | POA: Diagnosis not present

## 2019-09-05 DIAGNOSIS — G4700 Insomnia, unspecified: Secondary | ICD-10-CM | POA: Diagnosis not present

## 2019-09-05 DIAGNOSIS — E1169 Type 2 diabetes mellitus with other specified complication: Secondary | ICD-10-CM | POA: Diagnosis not present

## 2019-09-05 DIAGNOSIS — G43909 Migraine, unspecified, not intractable, without status migrainosus: Secondary | ICD-10-CM | POA: Diagnosis not present

## 2019-09-05 DIAGNOSIS — M795 Residual foreign body in soft tissue: Secondary | ICD-10-CM | POA: Diagnosis not present

## 2019-09-05 DIAGNOSIS — M48061 Spinal stenosis, lumbar region without neurogenic claudication: Secondary | ICD-10-CM | POA: Diagnosis not present

## 2019-09-05 DIAGNOSIS — S91311A Laceration without foreign body, right foot, initial encounter: Secondary | ICD-10-CM | POA: Diagnosis not present

## 2019-09-05 DIAGNOSIS — E7800 Pure hypercholesterolemia, unspecified: Secondary | ICD-10-CM | POA: Diagnosis not present

## 2019-09-05 DIAGNOSIS — B353 Tinea pedis: Secondary | ICD-10-CM | POA: Diagnosis not present

## 2019-09-05 DIAGNOSIS — M8588 Other specified disorders of bone density and structure, other site: Secondary | ICD-10-CM | POA: Diagnosis not present

## 2019-09-08 DIAGNOSIS — M47816 Spondylosis without myelopathy or radiculopathy, lumbar region: Secondary | ICD-10-CM | POA: Diagnosis not present

## 2019-09-15 DIAGNOSIS — B353 Tinea pedis: Secondary | ICD-10-CM | POA: Diagnosis not present

## 2019-09-15 DIAGNOSIS — S90851D Superficial foreign body, right foot, subsequent encounter: Secondary | ICD-10-CM | POA: Diagnosis not present

## 2019-09-18 DIAGNOSIS — L259 Unspecified contact dermatitis, unspecified cause: Secondary | ICD-10-CM | POA: Diagnosis not present

## 2019-09-21 DIAGNOSIS — M47816 Spondylosis without myelopathy or radiculopathy, lumbar region: Secondary | ICD-10-CM | POA: Diagnosis not present

## 2019-09-22 ENCOUNTER — Ambulatory Visit: Payer: 59

## 2019-09-30 ENCOUNTER — Ambulatory Visit: Payer: 59 | Attending: Internal Medicine

## 2019-09-30 ENCOUNTER — Ambulatory Visit: Payer: Medicare Other

## 2019-09-30 DIAGNOSIS — Z23 Encounter for immunization: Secondary | ICD-10-CM | POA: Insufficient documentation

## 2019-09-30 NOTE — Progress Notes (Signed)
   Covid-19 Vaccination Clinic  Name:  Kathleen Peters    MRN: [id number] DOB: April 18, 1951  09/30/2019  Kathleen Peters was observed post Covid-19 immunization for 15 minutes without incidence. She was provided with Vaccine Information Sheet and instruction to access the V-Safe system.   Kathleen Peters was instructed to call 911 with any severe reactions post vaccine: Marland Kitchen Difficulty breathing  . Swelling of your face and throat  . A fast heartbeat  . A bad rash all over your body  . Dizziness and weakness    Immunizations Administered    Name Date Dose VIS Date Route   Pfizer COVID-19 Vaccine 09/30/2019  1:57 PM 0.3 mL 08/04/2019 Intramuscular   Manufacturer: [place]   Lot: [id number]   [place]: [id number]

## 2019-10-20 DIAGNOSIS — M47816 Spondylosis without myelopathy or radiculopathy, lumbar region: Secondary | ICD-10-CM | POA: Diagnosis not present

## 2019-10-25 ENCOUNTER — Ambulatory Visit: Payer: Self-pay | Attending: Internal Medicine

## 2019-10-25 DIAGNOSIS — Z23 Encounter for immunization: Secondary | ICD-10-CM | POA: Insufficient documentation

## 2019-10-25 NOTE — Progress Notes (Signed)
   Covid-19 Vaccination Clinic  Name:  Kathleen Peters    MRN: [id number] DOB: May 19, 1951  10/25/2019  Kathleen Peters was observed post Covid-19 immunization for 15 minutes without incident. She was provided with Vaccine Information Sheet and instruction to access the V-Safe system.   Kathleen Peters was instructed to call 911 with any severe reactions post vaccine: Marland Kitchen Difficulty breathing  . Swelling of face and throat  . A fast heartbeat  . A bad rash all over body  . Dizziness and weakness   Immunizations Administered    Name Date Dose VIS Date Route   Pfizer COVID-19 Vaccine 10/25/2019 10:53 AM 0.3 mL 08/04/2019 Intramuscular   Manufacturer: [place]   Lot: [id number]   [place]: [id number]

## 2019-11-17 DIAGNOSIS — N183 Chronic kidney disease, stage 3 unspecified: Secondary | ICD-10-CM | POA: Diagnosis not present

## 2019-11-17 DIAGNOSIS — M8580 Other specified disorders of bone density and structure, unspecified site: Secondary | ICD-10-CM | POA: Diagnosis not present

## 2019-11-17 DIAGNOSIS — E7800 Pure hypercholesterolemia, unspecified: Secondary | ICD-10-CM | POA: Diagnosis not present

## 2019-11-17 DIAGNOSIS — E1169 Type 2 diabetes mellitus with other specified complication: Secondary | ICD-10-CM | POA: Diagnosis not present

## 2019-12-04 DIAGNOSIS — Z1231 Encounter for screening mammogram for malignant neoplasm of breast: Secondary | ICD-10-CM | POA: Diagnosis not present

## 2019-12-04 DIAGNOSIS — Z6825 Body mass index (BMI) 25.0-25.9, adult: Secondary | ICD-10-CM | POA: Diagnosis not present

## 2019-12-04 DIAGNOSIS — Z124 Encounter for screening for malignant neoplasm of cervix: Secondary | ICD-10-CM | POA: Diagnosis not present

## 2020-01-08 ENCOUNTER — Other Ambulatory Visit: Payer: Self-pay | Admitting: Gastroenterology

## 2020-01-08 DIAGNOSIS — N281 Cyst of kidney, acquired: Secondary | ICD-10-CM

## 2020-01-08 DIAGNOSIS — K862 Cyst of pancreas: Secondary | ICD-10-CM

## 2020-02-06 DIAGNOSIS — Z961 Presence of intraocular lens: Secondary | ICD-10-CM | POA: Diagnosis not present

## 2020-02-12 ENCOUNTER — Ambulatory Visit
Admission: RE | Admit: 2020-02-12 | Discharge: 2020-02-12 | Disposition: A | Discharge status: Home / Self Care | Payer: Medicare HMO | Source: Ambulatory Visit | Attending: Gastroenterology | Admitting: Gastroenterology

## 2020-02-12 DIAGNOSIS — K8020 Calculus of gallbladder without cholecystitis without obstruction: Secondary | ICD-10-CM | POA: Diagnosis not present

## 2020-02-12 DIAGNOSIS — K862 Cyst of pancreas: Secondary | ICD-10-CM

## 2020-02-12 DIAGNOSIS — N281 Cyst of kidney, acquired: Secondary | ICD-10-CM

## 2020-02-12 MED ORDER — IOPAMIDOL (ISOVUE-300) INJECTION 61%
100.0000 mL | Freq: Once | INTRAVENOUS | Status: AC | PRN
  Administered 2020-02-12: 80 mL via INTRAVENOUS

## 2020-02-19 DIAGNOSIS — Z6824 Body mass index (BMI) 24.0-24.9, adult: Secondary | ICD-10-CM | POA: Diagnosis not present

## 2020-02-19 DIAGNOSIS — Z713 Dietary counseling and surveillance: Secondary | ICD-10-CM | POA: Diagnosis not present

## 2020-02-20 DIAGNOSIS — E1169 Type 2 diabetes mellitus with other specified complication: Secondary | ICD-10-CM | POA: Diagnosis not present

## 2020-02-20 DIAGNOSIS — M8580 Other specified disorders of bone density and structure, unspecified site: Secondary | ICD-10-CM | POA: Diagnosis not present

## 2020-02-20 DIAGNOSIS — N183 Chronic kidney disease, stage 3 unspecified: Secondary | ICD-10-CM | POA: Diagnosis not present

## 2020-02-20 DIAGNOSIS — E7800 Pure hypercholesterolemia, unspecified: Secondary | ICD-10-CM | POA: Diagnosis not present

## 2020-03-05 DIAGNOSIS — Z1331 Encounter for screening for depression: Secondary | ICD-10-CM | POA: Diagnosis not present

## 2020-03-05 DIAGNOSIS — R7989 Other specified abnormal findings of blood chemistry: Secondary | ICD-10-CM | POA: Diagnosis not present

## 2020-03-05 DIAGNOSIS — K7689 Other specified diseases of liver: Secondary | ICD-10-CM | POA: Diagnosis not present

## 2020-03-05 DIAGNOSIS — R232 Flushing: Secondary | ICD-10-CM | POA: Diagnosis not present

## 2020-03-05 DIAGNOSIS — G43909 Migraine, unspecified, not intractable, without status migrainosus: Secondary | ICD-10-CM | POA: Diagnosis not present

## 2020-03-05 DIAGNOSIS — E7800 Pure hypercholesterolemia, unspecified: Secondary | ICD-10-CM | POA: Diagnosis not present

## 2020-03-05 DIAGNOSIS — Z0000 Encounter for general adult medical examination without abnormal findings: Secondary | ICD-10-CM | POA: Diagnosis not present

## 2020-03-05 DIAGNOSIS — E1169 Type 2 diabetes mellitus with other specified complication: Secondary | ICD-10-CM | POA: Diagnosis not present

## 2020-03-05 DIAGNOSIS — G4700 Insomnia, unspecified: Secondary | ICD-10-CM | POA: Diagnosis not present

## 2020-03-05 DIAGNOSIS — M48061 Spinal stenosis, lumbar region without neurogenic claudication: Secondary | ICD-10-CM | POA: Diagnosis not present

## 2020-03-05 DIAGNOSIS — M8588 Other specified disorders of bone density and structure, other site: Secondary | ICD-10-CM | POA: Diagnosis not present

## 2020-03-05 DIAGNOSIS — Z1211 Encounter for screening for malignant neoplasm of colon: Secondary | ICD-10-CM | POA: Diagnosis not present

## 2020-03-26 DIAGNOSIS — H9012 Conductive hearing loss, unilateral, left ear, with unrestricted hearing on the contralateral side: Secondary | ICD-10-CM | POA: Diagnosis not present

## 2020-04-02 DIAGNOSIS — R946 Abnormal results of thyroid function studies: Secondary | ICD-10-CM | POA: Diagnosis not present

## 2020-04-02 DIAGNOSIS — R7989 Other specified abnormal findings of blood chemistry: Secondary | ICD-10-CM | POA: Diagnosis not present

## 2020-04-02 DIAGNOSIS — R399 Unspecified symptoms and signs involving the genitourinary system: Secondary | ICD-10-CM | POA: Diagnosis not present

## 2020-04-02 LAB — TSH: TSH: 0.01 — AB (ref <=5.90)

## 2020-04-03 ENCOUNTER — Encounter: Payer: Self-pay | Admitting: Endocrinology

## 2020-04-03 LAB — LAB REPORT - SCANNED
T3, Free: 3.12
TSH: 0.17

## 2020-04-12 ENCOUNTER — Ambulatory Visit: Payer: Medicare HMO | Admitting: Endocrinology

## 2020-04-12 ENCOUNTER — Telehealth: Payer: Self-pay | Admitting: Endocrinology

## 2020-04-12 ENCOUNTER — Encounter: Payer: Self-pay | Admitting: Endocrinology

## 2020-04-12 ENCOUNTER — Other Ambulatory Visit: Payer: Self-pay

## 2020-04-12 DIAGNOSIS — E0590 Thyrotoxicosis, unspecified without thyrotoxic crisis or storm: Secondary | ICD-10-CM

## 2020-04-12 MED ORDER — METHIMAZOLE 10 MG PO TABS: 20.0000 mg | ORAL_TABLET | Freq: Two times a day (BID) | ORAL | 1 refills | Status: DC

## 2020-04-12 MED ORDER — METHIMAZOLE 10 MG PO TABS: 20.0000 mg | ORAL_TABLET | Freq: Two times a day (BID) | ORAL | 5 refills | Status: DC

## 2020-04-12 NOTE — Patient Instructions (Addendum)
I have sent a prescription to your pharmacy, to slow the thyroid. If ever you have fever while taking methimazole, stop it and call us, even if the reason is obvious, because of the risk of a rare side-effect. Please come back for a follow-up appointment in 1 month.     Hyperthyroidism  Hyperthyroidism is when the thyroid gland is too active (overactive). The thyroid gland is a small gland located in the lower front part of the neck, just in front of the windpipe (trachea). This gland makes hormones that help control how the body uses food for energy (metabolism) as well as how the heart and brain function. These hormones also play a role in keeping your bones strong. When the thyroid is overactive, it produces too much of a hormone called thyroxine. What are the causes? This condition may be caused by:  Graves' disease. This is a disorder in which the body's disease-fighting system (immune system) attacks the thyroid gland. This is the most common cause.  Inflammation of the thyroid gland.  A tumor in the thyroid gland.  Use of certain medicines, including: ? Prescription thyroid hormone replacement. ? Herbal supplements that mimic thyroid hormones. ? Amiodarone therapy.  Solid or fluid-filled lumps within your thyroid gland (thyroid nodules).  Taking in a large amount of iodine from foods or medicines. What increases the risk? You are more likely to develop this condition if:  You are female.  You have a family history of thyroid conditions.  You smoke tobacco.  You use a medicine called lithium.  You take medicines that affect the immune system (immunosuppressants). What are the signs or symptoms? Symptoms of this condition include:  Nervousness.  Inability to tolerate heat.  Unexplained weight loss.  Diarrhea.  Change in the texture of hair or skin.  Heart skipping beats or making extra beats.  Rapid heart rate.  Loss of menstruation.  Shaky  hands.  Fatigue.  Restlessness.  Sleep problems.  Enlarged thyroid gland or a lump in the thyroid (nodule). You may also have symptoms of Graves' disease, which may include:  Protruding eyes.  Dry eyes.  Red or swollen eyes.  Problems with vision. How is this diagnosed? This condition may be diagnosed based on:  Your symptoms and medical history.  A physical exam.  Blood tests.  Thyroid ultrasound. This test involves using sound waves to produce images of the thyroid gland.  A thyroid scan. A radioactive substance is injected into a vein, and images show how much iodine is present in the thyroid.  Radioactive iodine uptake test (RAIU). A small amount of radioactive iodine is given by mouth to see how much iodine the thyroid absorbs after a certain amount of time. How is this treated? Treatment depends on the cause and severity of the condition. Treatment may include:  Medicines to reduce the amount of thyroid hormone your body makes.  Radioactive iodine treatment (radioiodine therapy). This involves swallowing a small dose of radioactive iodine, in capsule or liquid form, to kill thyroid cells.  Surgery to remove part or all of your thyroid gland. You may need to take thyroid hormone replacement medicine for the rest of your life after thyroid surgery.  Medicines to help manage your symptoms. Follow these instructions at home:   Take over-the-counter and prescription medicines only as told by your health care provider.  Do not use any products that contain nicotine or tobacco, such as cigarettes and e-cigarettes. If you need help quitting, ask your health care  provider.  Follow any instructions from your health care provider about diet. You may be instructed to limit foods that contain iodine.  Keep all follow-up visits as told by your health care provider. This is important. ? You will need to have blood tests regularly so that your health care provider can  monitor your condition. Contact a health care provider if:  Your symptoms do not get better with treatment.  You have a fever.  You are taking thyroid hormone replacement medicine and you: ? Have symptoms of depression. ? Feel like you are tired all the time. ? Gain weight. Get help right away if:  You have chest pain.  You have decreased alertness or a change in your awareness.  You have abdominal pain.  You feel dizzy.  You have a rapid heartbeat.  You have an irregular heartbeat.  You have difficulty breathing. Summary  The thyroid gland is a small gland located in the lower front part of the neck, just in front of the windpipe (trachea).  Hyperthyroidism is when the thyroid gland is too active (overactive) and produces too much of a hormone called thyroxine.  The most common cause is Graves' disease, a disorder in which your immune system attacks the thyroid gland.  Hyperthyroidism can cause various symptoms, such as unexplained weight loss, nervousness, inability to tolerate heat, or changes in your heartbeat.  Treatment may include medicine to reduce the amount of thyroid hormone your body makes, radioiodine therapy, surgery, or medicines to manage symptoms. This information is not intended to replace advice given to you by your health care provider. Make sure you discuss any questions you have with your health care provider. Document Revised: 07/23/2017 Document Reviewed: 07/21/2017 Elsevier Patient Education  2020 [employer].

## 2020-04-12 NOTE — Telephone Encounter (Signed)
These are automatically sent at the close of the visit. It appears Dr. Loanne Drilling has not yet completed the notes from today nor signed. Once completed and signed electronically, the visit notes send to the referring provider automatically.

## 2020-04-12 NOTE — Progress Notes (Signed)
Subjective:    Patient ID: Kathleen Peters, female    DOB: Mar 14, 1951, 69 y.o.   MRN: [id number]  HPI Pt is referred by Dr Kathleen Peters, for hyperthyroidism.  Pt reports he was dx'ed with abnormal; TFT in 2021.  she has never been on therapy for this.  she has never had XRT to the anterior neck, or thyroid surgery.  she has never had thyroid imaging.  she does not consume kelp or any other non-prescribed thyroid medication.  she has never been on amiodarone.  She reports anxiety, palpitations, doe, insomnia, itching, tremor, heat intolerance, and excessive diaphoresis.  No past medical history on file.   Social History   Socioeconomic History  . Marital status: Married    Spouse name: Not on file  . Number of children: Not on file  . Years of education: Not on file  . Highest education level: Not on file  Occupational History  . Not on file  Tobacco Use  . Smoking status: Never Smoker  . Smokeless tobacco: Never Used  Substance and Sexual Activity  . Alcohol use: Not on file  . Drug use: Not on file  . Sexual activity: Not on file  Other Topics Concern  . Not on file  Social History Narrative  . Not on file   Social Determinants of Health   Financial Resource Strain:   . Difficulty of Paying Living Expenses: Not on file  Food Insecurity:   . Worried About Charity fundraiser in the Last Year: Not on file  . Ran Out of Food in the Last Year: Not on file  Transportation Needs:   . Lack of Transportation (Medical): Not on file  . Lack of Transportation (Non-Medical): Not on file  Physical Activity:   . Days of Exercise per Week: Not on file  . Minutes of Exercise per Session: Not on file  Stress:   . Feeling of Stress : Not on file  Social Connections:   . Frequency of Communication with Friends and Family: Not on file  . Frequency of Social Gatherings with Friends and Family: Not on file  . Attends Religious Services: Not on file  . Active Member of Clubs or Organizations:  Not on file  . Attends Archivist Meetings: Not on file  . Marital Status: Not on file  Intimate Partner Violence:   . Fear of Current or Ex-Partner: Not on file  . Emotionally Abused: Not on file  . Physically Abused: Not on file  . Sexually Abused: Not on file    Current Outpatient Medications on File Prior to Visit  Medication Sig Dispense Refill  . atenolol (TENORMIN) 25 MG tablet Take 25 mg by mouth every other day.  3  . Cholecalciferol (VITAMIN D3) 2000 units capsule Take 2,000 Units by mouth daily.     Marland Kitchen eletriptan (RELPAX) 40 MG tablet Take 40 mg by mouth as needed for migraine.   1  . estradiol (ESTRACE) 1 MG tablet Take 1 mg by mouth daily.     Marland Kitchen LORazepam (ATIVAN) 1 MG tablet Take 1 mg by mouth at bedtime as needed.  5  . Multiple Vitamin (MULTI-VITAMINS) TABS Take 1 tablet by mouth daily.     . Omega-3 Fatty Acids (FISH OIL) 1200 MG CAPS Take 1 capsule by mouth daily.     . traMADol (ULTRAM) 50 MG tablet Take 50 mg by mouth as needed for moderate pain.      No current facility-administered  medications on file prior to visit.    No Known Allergies  Family History  Problem Relation Age of Onset  . Thyroid cancer Sister     BP 122/76   Pulse 88   Ht 5\' 4"  (1.626 m)   Wt 140 lb 3.2 oz (63.6 kg)   SpO2 98%   BMI 24.07 kg/m    Review of Systems denies weight loss and muscle weakness.      Objective:   Physical Exam VS: see vs page GEN: no distress HEAD: head: no deformity eyes: no periorbital swelling, no proptosis external nose and ears are normal NECK: supple, thyroid is not enlarged.   LUNGS: clear to auscultation CV: reg rate and rhythm, no murmur.  MUSCULOSKELETAL: gait is normal and steady EXTEMITIES: no leg edema PULSES: no carotid bruit NEURO:  cn 2-12 grossly intact.   readily moves all 4's.  sensation is intact to touch on all 4's.  No tremor.   SKIN:  Normal texture and temperature.  No rash or suspicious lesion is visible.  Not  diaphoretic.   NODES:  None palpable at the neck PSYCH:  Does not appear anxious nor depressed.    Lab Results  Component Value Date   TSH 0.01 (A) 04/02/2020  free T4=3.12  I have reviewed outside records, and summarized: Pt was noted to have suppressed, and referred here.  She was seen for wellness visit, and abnormal TFT were noted.  Insomnia, headache, and OA were also addressed      Assessment & Plan:  Hyperthyroidism, new.  Uncontrolled.    Patient Instructions  I have sent a prescription to your pharmacy, to slow the thyroid. If ever you have fever while taking methimazole, stop it and call us, even if the reason is obvious, because of the risk of a rare side-effect. Please come back for a follow-up appointment in 1 month.     Hyperthyroidism  Hyperthyroidism is when the thyroid gland is too active (overactive). The thyroid gland is a small gland located in the lower front part of the neck, just in front of the windpipe (trachea). This gland makes hormones that help control how the body uses food for energy (metabolism) as well as how the heart and brain function. These hormones also play a role in keeping your bones strong. When the thyroid is overactive, it produces too much of a hormone called thyroxine. What are the causes? This condition may be caused by:  Graves' disease. This is a disorder in which the body's disease-fighting system (immune system) attacks the thyroid gland. This is the most common cause.  Inflammation of the thyroid gland.  A tumor in the thyroid gland.  Use of certain medicines, including: ? Prescription thyroid hormone replacement. ? Herbal supplements that mimic thyroid hormones. ? Amiodarone therapy.  Solid or fluid-filled lumps within your thyroid gland (thyroid nodules).  Taking in a large amount of iodine from foods or medicines. What increases the risk? You are more likely to develop this condition if:  You are female.  You  have a family history of thyroid conditions.  You smoke tobacco.  You use a medicine called lithium.  You take medicines that affect the immune system (immunosuppressants). What are the signs or symptoms? Symptoms of this condition include:  Nervousness.  Inability to tolerate heat.  Unexplained weight loss.  Diarrhea.  Change in the texture of hair or skin.  Heart skipping beats or making extra beats.  Rapid heart rate.  Loss of  menstruation.  Shaky hands.  Fatigue.  Restlessness.  Sleep problems.  Enlarged thyroid gland or a lump in the thyroid (nodule). You may also have symptoms of Graves' disease, which may include:  Protruding eyes.  Dry eyes.  Red or swollen eyes.  Problems with vision. How is this diagnosed? This condition may be diagnosed based on:  Your symptoms and medical history.  A physical exam.  Blood tests.  Thyroid ultrasound. This test involves using sound waves to produce images of the thyroid gland.  A thyroid scan. A radioactive substance is injected into a vein, and images show how much iodine is present in the thyroid.  Radioactive iodine uptake test (RAIU). A small amount of radioactive iodine is given by mouth to see how much iodine the thyroid absorbs after a certain amount of time. How is this treated? Treatment depends on the cause and severity of the condition. Treatment may include:  Medicines to reduce the amount of thyroid hormone your body makes.  Radioactive iodine treatment (radioiodine therapy). This involves swallowing a small dose of radioactive iodine, in capsule or liquid form, to kill thyroid cells.  Surgery to remove part or all of your thyroid gland. You may need to take thyroid hormone replacement medicine for the rest of your life after thyroid surgery.  Medicines to help manage your symptoms. Follow these instructions at home:   Take over-the-counter and prescription medicines only as told by your  health care provider.  Do not use any products that contain nicotine or tobacco, such as cigarettes and e-cigarettes. If you need help quitting, ask your health care provider.  Follow any instructions from your health care provider about diet. You may be instructed to limit foods that contain iodine.  Keep all follow-up visits as told by your health care provider. This is important. ? You will need to have blood tests regularly so that your health care provider can monitor your condition. Contact a health care provider if:  Your symptoms do not get better with treatment.  You have a fever.  You are taking thyroid hormone replacement medicine and you: ? Have symptoms of depression. ? Feel like you are tired all the time. ? Gain weight. Get help right away if:  You have chest pain.  You have decreased alertness or a change in your awareness.  You have abdominal pain.  You feel dizzy.  You have a rapid heartbeat.  You have an irregular heartbeat.  You have difficulty breathing. Summary  The thyroid gland is a small gland located in the lower front part of the neck, just in front of the windpipe (trachea).  Hyperthyroidism is when the thyroid gland is too active (overactive) and produces too much of a hormone called thyroxine.  The most common cause is Graves' disease, a disorder in which your immune system attacks the thyroid gland.  Hyperthyroidism can cause various symptoms, such as unexplained weight loss, nervousness, inability to tolerate heat, or changes in your heartbeat.  Treatment may include medicine to reduce the amount of thyroid hormone your body makes, radioiodine therapy, surgery, or medicines to manage symptoms. This information is not intended to replace advice given to you by your health care provider. Make sure you discuss any questions you have with your health care provider. Document Revised: 07/23/2017 Document Reviewed: 07/21/2017 Elsevier Patient  Education  2020 [employer].

## 2020-04-12 NOTE — Telephone Encounter (Signed)
At check out patient requests to have office notes from her appointment with Dr. Loanne Drilling 04/12/20 (today) sent to her PCP (Referring Physician) -Dr. Antony Contras at [facility] Fax# [phone].

## 2020-04-14 DIAGNOSIS — E0590 Thyrotoxicosis, unspecified without thyrotoxic crisis or storm: Secondary | ICD-10-CM | POA: Insufficient documentation

## 2020-04-16 ENCOUNTER — Telehealth: Payer: Self-pay | Admitting: Endocrinology

## 2020-04-16 NOTE — Telephone Encounter (Signed)
Patient called stating she was supposed to have an Rx for methimazole also called into [facility] mail delivery for a 90 day supply. Ph# [phone]

## 2020-04-17 ENCOUNTER — Other Ambulatory Visit: Payer: Self-pay

## 2020-04-17 DIAGNOSIS — E0590 Thyrotoxicosis, unspecified without thyrotoxic crisis or storm: Secondary | ICD-10-CM

## 2020-04-17 MED ORDER — METHIMAZOLE 10 MG PO TABS: 20.0000 mg | ORAL_TABLET | Freq: Two times a day (BID) | ORAL | 1 refills | Status: DC

## 2020-04-17 NOTE — Telephone Encounter (Signed)
Correction has been made:  Outpatient Medication Detail   Disp Refills Start End   methimazole (TAPAZOLE) 10 MG tablet 360 tablet 1 04/17/2020    Sig - Route: Take 2 tablets (20 mg total) by mouth 2 (two) times daily. - Oral   Sent to pharmacy as: methimazole (TAPAZOLE) 10 MG tablet   E-Prescribing Status: Receipt confirmed by pharmacy (04/17/2020  8:00 AM EDT)    [facility], [address]  [place], [place] [address]  Phone:  [phone]  Fax:  [phone]

## 2020-04-17 NOTE — Telephone Encounter (Signed)
Outpatient Medication Detail   Disp Refills Start End   methimazole (TAPAZOLE) 10 MG tablet 360 tablet 1 04/12/2020    Sig - Route: Take 2 tablets (20 mg total) by mouth 2 (two) times daily. - Oral   Sent to pharmacy as: methimazole (TAPAZOLE) 10 MG tablet   E-Prescribing Status: Receipt confirmed by pharmacy (04/12/2020  2:49 PM EDT)    [facility], [place]  [address], [place] [address]  Phone:  [phone]  Fax:  [phone]    Rx was sent as indicated above. However, pt failed to request the Rx be sent to mail in pharmacy rather than a local pharmacy.

## 2020-05-17 ENCOUNTER — Ambulatory Visit (INDEPENDENT_AMBULATORY_CARE_PROVIDER_SITE_OTHER): Payer: Medicare HMO | Admitting: Endocrinology

## 2020-05-17 ENCOUNTER — Other Ambulatory Visit: Payer: Self-pay

## 2020-05-17 VITALS — BP 132/70 | HR 63 | Ht 64.0 in | Wt 139.0 lb

## 2020-05-17 DIAGNOSIS — E0590 Thyrotoxicosis, unspecified without thyrotoxic crisis or storm: Secondary | ICD-10-CM | POA: Diagnosis not present

## 2020-05-17 NOTE — Progress Notes (Signed)
Subjective:    Patient ID: Kathleen Peters, female    DOB: 1951/04/21, 69 y.o.   MRN: [id number]  HPI Pt returns for f/u of hyperthyroidism (dx'ed 2021; she has never had thyroid imaging; tapazole was chosen as initial rx, due to severity.  Atenolol is for headache, not thyroid).  since on tapazole, pt states she feels better in general.  Specifically, heat intolerance is less now.  However, insomnia persists.    Social History   Socioeconomic History  . Marital status: Married    Spouse name: Not on file  . Number of children: Not on file  . Years of education: Not on file  . Highest education level: Not on file  Occupational History  . Not on file  Tobacco Use  . Smoking status: Never Smoker  . Smokeless tobacco: Never Used  Substance and Sexual Activity  . Alcohol use: Not on file  . Drug use: Not on file  . Sexual activity: Not on file  Other Topics Concern  . Not on file  Social History Narrative  . Not on file   Social Determinants of Health   Financial Resource Strain:   . Difficulty of Paying Living Expenses: Not on file  Food Insecurity:   . Worried About Charity fundraiser in the Last Year: Not on file  . Ran Out of Food in the Last Year: Not on file  Transportation Needs:   . Lack of Transportation (Medical): Not on file  . Lack of Transportation (Non-Medical): Not on file  Physical Activity:   . Days of Exercise per Week: Not on file  . Minutes of Exercise per Session: Not on file  Stress:   . Feeling of Stress : Not on file  Social Connections:   . Frequency of Communication with Friends and Family: Not on file  . Frequency of Social Gatherings with Friends and Family: Not on file  . Attends Religious Services: Not on file  . Active Member of Clubs or Organizations: Not on file  . Attends Archivist Meetings: Not on file  . Marital Status: Not on file  Intimate Partner Violence:   . Fear of Current or Ex-Partner: Not on file  . Emotionally  Abused: Not on file  . Physically Abused: Not on file  . Sexually Abused: Not on file    Current Outpatient Medications on File Prior to Visit  Medication Sig Dispense Refill  . atenolol (TENORMIN) 25 MG tablet Take 25 mg by mouth every other day.  3  . Cholecalciferol (VITAMIN D3) 2000 units capsule Take 2,000 Units by mouth daily.     Marland Kitchen eletriptan (RELPAX) 40 MG tablet Take 40 mg by mouth as needed for migraine.   1  . estradiol (ESTRACE) 1 MG tablet Take 1 mg by mouth daily.     Marland Kitchen estradiol (ESTRACE) 2 MG tablet     . LORazepam (ATIVAN) 1 MG tablet Take 1 mg by mouth at bedtime as needed.  5  . methimazole (TAPAZOLE) 10 MG tablet Take 2 tablets (20 mg total) by mouth 2 (two) times daily. 360 tablet 1  . Multiple Vitamin (MULTI-VITAMINS) TABS Take 1 tablet by mouth daily.     . Omega-3 Fatty Acids (FISH OIL) 1200 MG CAPS Take 1 capsule by mouth daily.     . traMADol (ULTRAM) 50 MG tablet Take 50 mg by mouth as needed for moderate pain.      No current facility-administered medications on file  prior to visit.    No Known Allergies  Family History  Problem Relation Age of Onset  . Thyroid cancer Sister     BP 132/70   Pulse 63   Ht 5\' 4"  (1.626 m)   Wt 139 lb (63 kg)   SpO2 98%   BMI 23.86 kg/m   Review of Systems Denies fever    Objective:   Physical Exam VITAL SIGNS:  See vs page GENERAL: no distress NECK: There is no palpable thyroid enlargement.  No thyroid nodule is palpable.  No palpable lymphadenopathy at the anterior neck.    Lab Results  Component Value Date   TSH 0.01 (A) 04/02/2020      Assessment & Plan:  Hyperthyroidism, due for recheck.  Please continue the same tapazole.   Patient Instructions  Blood tests are requested for you today.  We'll let you know about the results.  If ever you have fever while taking methimazole, stop it and call us, even if the reason is obvious, because of the risk of a rare side-effect. It is best to never miss the  medication.  However, if you do miss it, next best is to double up the next time. Please come back for a follow-up appointment in 2 months.  If you want, you could take the 1-time Radioactive iodine treatment pill.  It works like this: We would first check a thyroid "scan" (a special, but easy and painless type of thyroid x ray).  you go to the x-ray department of the hospital to swallow a pill, which contains a miniscule amount of radiation.  You will not notice any symptoms from this.  You will go back to the x-ray department the next day, to lie down in front of a camera.  The results of this will be sent to me.   Based on the results, I would hope to order for you a treatment pill of radioactive iodine.  Although it is a larger amount of radiation, you will again notice no symptoms from this.  The pill is gone from your body in a few days (during which you should stay away from other people), but takes several months to work.  Therefore, please return here approximately 6-8 weeks after the treatment.  This treatment has been available for many years, and the only known side-effect is an underactive thyroid.  It is possible that i would eventually prescribe for you a thyroid hormone pill, which is very inexpensive.  You don't have to worry about side-effects of this thyroid hormone pill, because it is the same molecule your thyroid makes.

## 2020-05-17 NOTE — Patient Instructions (Addendum)
Blood tests are requested for you today.  We'll let you know about the results.  If ever you have fever while taking methimazole, stop it and call us, even if the reason is obvious, because of the risk of a rare side-effect. It is best to never miss the medication.  However, if you do miss it, next best is to double up the next time. Please come back for a follow-up appointment in 2 months.  If you want, you could take the 1-time Radioactive iodine treatment pill.  It works like this: We would first check a thyroid "scan" (a special, but easy and painless type of thyroid x ray).  you go to the x-ray department of the hospital to swallow a pill, which contains a miniscule amount of radiation.  You will not notice any symptoms from this.  You will go back to the x-ray department the next day, to lie down in front of a camera.  The results of this will be sent to me.   Based on the results, I would hope to order for you a treatment pill of radioactive iodine.  Although it is a larger amount of radiation, you will again notice no symptoms from this.  The pill is gone from your body in a few days (during which you should stay away from other people), but takes several months to work.  Therefore, please return here approximately 6-8 weeks after the treatment.  This treatment has been available for many years, and the only known side-effect is an underactive thyroid.  It is possible that i would eventually prescribe for you a thyroid hormone pill, which is very inexpensive.  You don't have to worry about side-effects of this thyroid hormone pill, because it is the same molecule your thyroid makes.

## 2020-05-20 LAB — TSH: TSH: 2.43 u[IU]/mL (ref 0.35–4.50)

## 2020-05-20 LAB — T4, FREE: Free T4: 0.47 ng/dL — ABNORMAL LOW (ref 0.60–1.60)

## 2020-06-12 DIAGNOSIS — Z23 Encounter for immunization: Secondary | ICD-10-CM | POA: Diagnosis not present

## 2020-06-25 DIAGNOSIS — I87323 Chronic venous hypertension (idiopathic) with inflammation of bilateral lower extremity: Secondary | ICD-10-CM | POA: Diagnosis not present

## 2020-07-22 ENCOUNTER — Other Ambulatory Visit: Payer: Self-pay

## 2020-07-22 ENCOUNTER — Ambulatory Visit (INDEPENDENT_AMBULATORY_CARE_PROVIDER_SITE_OTHER): Payer: Medicare HMO | Admitting: Endocrinology

## 2020-07-22 ENCOUNTER — Encounter: Payer: Self-pay | Admitting: Endocrinology

## 2020-07-22 VITALS — BP 118/64 | HR 68 | Ht 64.5 in | Wt 136.0 lb

## 2020-07-22 DIAGNOSIS — E0590 Thyrotoxicosis, unspecified without thyrotoxic crisis or storm: Secondary | ICD-10-CM | POA: Diagnosis not present

## 2020-07-22 LAB — T4, FREE: Free T4: 0.68 ng/dL (ref 0.60–1.60)

## 2020-07-22 LAB — TSH: TSH: 2.45 u[IU]/mL (ref 0.35–4.50)

## 2020-07-22 MED ORDER — METHIMAZOLE 10 MG PO TABS
10.0000 mg | ORAL_TABLET | Freq: Every day | ORAL | 1 refills | Status: AC
Start: 2020-07-22 — End: ?

## 2020-07-22 NOTE — Patient Instructions (Signed)
Blood tests are requested for you today.  We'll let you know about the results.   °If ever you have fever while taking methimazole, stop it and call us, even if the reason is obvious, because of the risk of a rare side-effect.   °It is best to never miss the medication.  However, if you do miss it, next best is to double up the next time.   °Please come back for a follow-up appointment in 3 months.   °

## 2020-07-22 NOTE — Progress Notes (Signed)
Subjective:    Patient ID: Kathleen Peters, female    DOB: 1951-01-09, 69 y.o.   MRN: [id number]  HPI Pt returns for f/u of hyperthyroidism (dx'ed 2021; she has never had thyroid imaging; tapazole was chosen as initial rx, due to severity.  Atenolol is for headache, not thyroid).  since on tapazole, pt states she feels better in general, except for insomnia.    Social History   Socioeconomic History  . Marital status: Married    Spouse name: Not on file  . Number of children: Not on file  . Years of education: Not on file  . Highest education level: Not on file  Occupational History  . Not on file  Tobacco Use  . Smoking status: Never Smoker  . Smokeless tobacco: Never Used  Substance and Sexual Activity  . Alcohol use: Not on file  . Drug use: Not on file  . Sexual activity: Not on file  Other Topics Concern  . Not on file  Social History Narrative  . Not on file   Social Determinants of Health   Financial Resource Strain:   . Difficulty of Paying Living Expenses: Not on file  Food Insecurity:   . Worried About Charity fundraiser in the Last Year: Not on file  . Ran Out of Food in the Last Year: Not on file  Transportation Needs:   . Lack of Transportation (Medical): Not on file  . Lack of Transportation (Non-Medical): Not on file  Physical Activity:   . Days of Exercise per Week: Not on file  . Minutes of Exercise per Session: Not on file  Stress:   . Feeling of Stress : Not on file  Social Connections:   . Frequency of Communication with Friends and Family: Not on file  . Frequency of Social Gatherings with Friends and Family: Not on file  . Attends Religious Services: Not on file  . Active Member of Clubs or Organizations: Not on file  . Attends Archivist Meetings: Not on file  . Marital Status: Not on file  Intimate Partner Violence:   . Fear of Current or Ex-Partner: Not on file  . Emotionally Abused: Not on file  . Physically Abused: Not on  file  . Sexually Abused: Not on file    Current Outpatient Medications on File Prior to Visit  Medication Sig Dispense Refill  . atenolol (TENORMIN) 25 MG tablet Take 25 mg by mouth every other day.  3  . Cholecalciferol (VITAMIN D3) 2000 units capsule Take 2,000 Units by mouth daily.     Marland Kitchen eletriptan (RELPAX) 40 MG tablet Take 40 mg by mouth as needed for migraine.   1  . estradiol (ESTRACE) 1 MG tablet Take 1 mg by mouth daily.     Marland Kitchen LORazepam (ATIVAN) 1 MG tablet Take 1 mg by mouth at bedtime as needed.  5  . Multiple Vitamin (MULTI-VITAMINS) TABS Take 1 tablet by mouth daily.     . Omega-3 Fatty Acids (FISH OIL) 1200 MG CAPS Take 1 capsule by mouth daily.     . traMADol (ULTRAM) 50 MG tablet Take 50 mg by mouth as needed for moderate pain.     Marland Kitchen zolpidem (AMBIEN) 10 MG tablet      No current facility-administered medications on file prior to visit.    No Known Allergies  Family History  Problem Relation Age of Onset  . Thyroid cancer Sister     BP 118/64  Pulse 68   Ht 5' 4.5" (1.638 m)   Wt 136 lb (61.7 kg)   SpO2 98%   BMI 22.98 kg/m    Review of Systems Denies fever.      Objective:   Physical Exam VITAL SIGNS:  See vs page GENERAL: no distress NECK: There is no palpable thyroid enlargement.  No thyroid nodule is palpable.  No palpable lymphadenopathy at the anterior neck.   Lab Results  Component Value Date   TSH 2.45 07/22/2020      Assessment & Plan:  Hyperthyroidism: well-controlled.  Please continue the same methimazole.

## 2020-08-29 DIAGNOSIS — Z808 Family history of malignant neoplasm of other organs or systems: Secondary | ICD-10-CM | POA: Diagnosis not present

## 2020-08-29 DIAGNOSIS — D509 Iron deficiency anemia, unspecified: Secondary | ICD-10-CM | POA: Diagnosis not present

## 2020-08-29 DIAGNOSIS — E0590 Thyrotoxicosis, unspecified without thyrotoxic crisis or storm: Secondary | ICD-10-CM | POA: Diagnosis not present

## 2020-08-29 DIAGNOSIS — I7300 Raynaud's syndrome without gangrene: Secondary | ICD-10-CM | POA: Diagnosis not present

## 2020-08-29 DIAGNOSIS — G4700 Insomnia, unspecified: Secondary | ICD-10-CM | POA: Diagnosis not present

## 2020-09-03 DIAGNOSIS — L814 Other melanin hyperpigmentation: Secondary | ICD-10-CM | POA: Diagnosis not present

## 2020-09-03 DIAGNOSIS — D2262 Melanocytic nevi of left upper limb, including shoulder: Secondary | ICD-10-CM | POA: Diagnosis not present

## 2020-09-03 DIAGNOSIS — L821 Other seborrheic keratosis: Secondary | ICD-10-CM | POA: Diagnosis not present

## 2020-09-03 DIAGNOSIS — Z85828 Personal history of other malignant neoplasm of skin: Secondary | ICD-10-CM | POA: Diagnosis not present

## 2020-09-03 DIAGNOSIS — L603 Nail dystrophy: Secondary | ICD-10-CM | POA: Diagnosis not present

## 2020-09-03 DIAGNOSIS — D2261 Melanocytic nevi of right upper limb, including shoulder: Secondary | ICD-10-CM | POA: Diagnosis not present

## 2020-09-03 DIAGNOSIS — D2271 Melanocytic nevi of right lower limb, including hip: Secondary | ICD-10-CM | POA: Diagnosis not present

## 2020-09-03 DIAGNOSIS — D225 Melanocytic nevi of trunk: Secondary | ICD-10-CM | POA: Diagnosis not present

## 2020-09-03 DIAGNOSIS — D2272 Melanocytic nevi of left lower limb, including hip: Secondary | ICD-10-CM | POA: Diagnosis not present

## 2020-09-27 DIAGNOSIS — E0590 Thyrotoxicosis, unspecified without thyrotoxic crisis or storm: Secondary | ICD-10-CM | POA: Diagnosis not present

## 2020-09-30 DIAGNOSIS — R768 Other specified abnormal immunological findings in serum: Secondary | ICD-10-CM | POA: Diagnosis not present

## 2020-09-30 DIAGNOSIS — E0590 Thyrotoxicosis, unspecified without thyrotoxic crisis or storm: Secondary | ICD-10-CM | POA: Diagnosis not present

## 2020-09-30 DIAGNOSIS — E0500 Thyrotoxicosis with diffuse goiter without thyrotoxic crisis or storm: Secondary | ICD-10-CM | POA: Diagnosis not present

## 2020-09-30 DIAGNOSIS — I7300 Raynaud's syndrome without gangrene: Secondary | ICD-10-CM | POA: Diagnosis not present

## 2020-09-30 DIAGNOSIS — Z808 Family history of malignant neoplasm of other organs or systems: Secondary | ICD-10-CM | POA: Diagnosis not present

## 2020-10-01 DIAGNOSIS — M12871 Other specific arthropathies, not elsewhere classified, right ankle and foot: Secondary | ICD-10-CM | POA: Diagnosis not present

## 2020-10-01 DIAGNOSIS — M19072 Primary osteoarthritis, left ankle and foot: Secondary | ICD-10-CM | POA: Diagnosis not present

## 2020-10-01 DIAGNOSIS — R768 Other specified abnormal immunological findings in serum: Secondary | ICD-10-CM | POA: Diagnosis not present

## 2020-10-01 DIAGNOSIS — M79642 Pain in left hand: Secondary | ICD-10-CM | POA: Diagnosis not present

## 2020-10-01 DIAGNOSIS — M79671 Pain in right foot: Secondary | ICD-10-CM | POA: Diagnosis not present

## 2020-10-01 DIAGNOSIS — M2550 Pain in unspecified joint: Secondary | ICD-10-CM | POA: Diagnosis not present

## 2020-10-01 DIAGNOSIS — I7300 Raynaud's syndrome without gangrene: Secondary | ICD-10-CM | POA: Diagnosis not present

## 2020-10-01 DIAGNOSIS — M79641 Pain in right hand: Secondary | ICD-10-CM | POA: Diagnosis not present

## 2020-10-01 DIAGNOSIS — E0500 Thyrotoxicosis with diffuse goiter without thyrotoxic crisis or storm: Secondary | ICD-10-CM | POA: Diagnosis not present

## 2020-10-01 DIAGNOSIS — M1990 Unspecified osteoarthritis, unspecified site: Secondary | ICD-10-CM | POA: Diagnosis not present

## 2020-10-01 DIAGNOSIS — L609 Nail disorder, unspecified: Secondary | ICD-10-CM | POA: Diagnosis not present

## 2020-10-01 DIAGNOSIS — Z79899 Other long term (current) drug therapy: Secondary | ICD-10-CM | POA: Diagnosis not present

## 2020-10-01 DIAGNOSIS — M0689 Other specified rheumatoid arthritis, multiple sites: Secondary | ICD-10-CM | POA: Diagnosis not present

## 2020-10-01 DIAGNOSIS — M79672 Pain in left foot: Secondary | ICD-10-CM | POA: Diagnosis not present

## 2020-10-22 ENCOUNTER — Ambulatory Visit: Payer: Medicare HMO | Admitting: Endocrinology

## 2020-10-24 DIAGNOSIS — E0500 Thyrotoxicosis with diffuse goiter without thyrotoxic crisis or storm: Secondary | ICD-10-CM | POA: Diagnosis not present

## 2020-10-24 DIAGNOSIS — M1990 Unspecified osteoarthritis, unspecified site: Secondary | ICD-10-CM | POA: Diagnosis not present

## 2020-10-24 DIAGNOSIS — I7300 Raynaud's syndrome without gangrene: Secondary | ICD-10-CM | POA: Diagnosis not present

## 2020-10-24 DIAGNOSIS — R768 Other specified abnormal immunological findings in serum: Secondary | ICD-10-CM | POA: Diagnosis not present

## 2020-10-24 DIAGNOSIS — L609 Nail disorder, unspecified: Secondary | ICD-10-CM | POA: Diagnosis not present

## 2020-10-24 DIAGNOSIS — Z79899 Other long term (current) drug therapy: Secondary | ICD-10-CM | POA: Diagnosis not present

## 2020-10-24 DIAGNOSIS — M2550 Pain in unspecified joint: Secondary | ICD-10-CM | POA: Diagnosis not present

## 2020-10-28 DIAGNOSIS — M8588 Other specified disorders of bone density and structure, other site: Secondary | ICD-10-CM | POA: Diagnosis not present

## 2020-10-28 DIAGNOSIS — G4700 Insomnia, unspecified: Secondary | ICD-10-CM | POA: Diagnosis not present

## 2020-10-28 DIAGNOSIS — E1169 Type 2 diabetes mellitus with other specified complication: Secondary | ICD-10-CM | POA: Diagnosis not present

## 2020-10-28 DIAGNOSIS — L409 Psoriasis, unspecified: Secondary | ICD-10-CM | POA: Diagnosis not present

## 2020-10-28 DIAGNOSIS — M48061 Spinal stenosis, lumbar region without neurogenic claudication: Secondary | ICD-10-CM | POA: Diagnosis not present

## 2020-10-28 DIAGNOSIS — E7800 Pure hypercholesterolemia, unspecified: Secondary | ICD-10-CM | POA: Diagnosis not present

## 2020-10-28 DIAGNOSIS — E0590 Thyrotoxicosis, unspecified without thyrotoxic crisis or storm: Secondary | ICD-10-CM | POA: Diagnosis not present

## 2020-10-28 DIAGNOSIS — E611 Iron deficiency: Secondary | ICD-10-CM | POA: Diagnosis not present

## 2020-10-28 DIAGNOSIS — G43909 Migraine, unspecified, not intractable, without status migrainosus: Secondary | ICD-10-CM | POA: Diagnosis not present

## 2020-11-25 ENCOUNTER — Ambulatory Visit: Payer: Medicare HMO | Admitting: Orthopaedic Surgery

## 2020-12-10 ENCOUNTER — Ambulatory Visit (INDEPENDENT_AMBULATORY_CARE_PROVIDER_SITE_OTHER): Payer: Medicare HMO

## 2020-12-10 ENCOUNTER — Ambulatory Visit: Payer: Medicare HMO | Admitting: Orthopaedic Surgery

## 2020-12-10 VITALS — Ht 64.5 in | Wt 136.0 lb

## 2020-12-10 DIAGNOSIS — G8929 Other chronic pain: Secondary | ICD-10-CM

## 2020-12-10 DIAGNOSIS — M25561 Pain in right knee: Secondary | ICD-10-CM

## 2020-12-10 MED ORDER — LIDOCAINE HCL 1 % IJ SOLN
3.0000 mL | INTRAMUSCULAR | Status: AC | PRN
  Administered 2020-12-10: 3 mL

## 2020-12-10 MED ORDER — METHYLPREDNISOLONE ACETATE 40 MG/ML IJ SUSP
40.0000 mg | INTRAMUSCULAR | Status: AC | PRN
  Administered 2020-12-10: 40 mg via INTRA_ARTICULAR

## 2020-12-10 NOTE — Progress Notes (Signed)
Office Visit Note   Patient: Kathleen Peters           Date of Birth: [birth year]           MRN: [id number] Visit Date: 12/10/2020              Requested by: Antony Contras, MD [place],  [place] [id number] PCP: Antony Contras, MD   Assessment & Plan: Visit Diagnoses:  1. Chronic pain of right knee     Plan: I did recommend a steroid injection for her right knee which she agreed to and tolerated well.  She will avoid pivoting activities and work on Forensic scientist.  She does report most of her pain is when she has been sitting for a while and she goes to put weight on her knee and she does get sharp pain.  Hopefully this will subside.  All questions and concerns were answered and addressed.  We will see her back in 4 weeks to see how she is doing overall.  Of note she cannot have an MRI due to some type of implant in her ear.  We would have to obtain another study such as a CT scan of the right knee if you are concerned about any internal derangement.  Follow-Up Instructions: Return in about 4 weeks (around 01/07/2021).   Orders:  Orders Placed This Encounter  Procedures  . Large Joint Inj  . XR Knee 1-2 Views Right   No orders of the defined types were placed in this encounter.     Procedures: Large Joint Inj: R knee on 12/10/2020 9:48 AM Indications: diagnostic evaluation and pain Details: 22 G 1.5 in needle, superolateral approach  Arthrogram: No  Medications: 3 mL lidocaine 1 %; 40 mg methylPREDNISolone acetate 40 MG/ML Outcome: tolerated well, no immediate complications Procedure, treatment alternatives, risks and benefits explained, specific risks discussed. Consent was given by the patient. Immediately prior to procedure a time out was called to verify the correct patient, procedure, equipment, support staff and site/side marked as required. Patient was prepped and draped in the usual sterile fashion.       Clinical Data: No additional  findings.   Subjective: Chief Complaint  Patient presents with  . Right Knee - Pain  The patient is a 70 year old female who has been dealing with more of an acute right knee issue since March 11 when she was noticing that her knee started to give way.  This is her right knee.  She does wear a knee sleeve when she does a lot of yard work and walking and other activities and she gets sharp pain occasionally and a giving way sensation but is not constant.  She never had surgery on that knee or injured it that she is aware of.  She is not diabetic.  She is a thin and active individual.  Her left knee is asymptomatic.  She does not report much swelling in her right knee but feels like the pain is deep inside the knee on the right side.  HPI  Review of Systems There is currently listed no headache, chest pain, shortness of breath, fever, chills, nausea, vomiting  Objective: Vital Signs: Ht 5' 4.5" (1.638 m)   Wt 136 lb (61.7 kg)   BMI 22.98 kg/m   Physical Exam She is alert and orient x3 and in no acute distress Ortho Exam Examination of both knees standing shows slight tendency for valgus malalignment.  Her right painful  knee has just some slight effusion and more overt tenderness seems to be on the medial joint line.  The range of motion is good and the knee feels ligamentously stable. Specialty Comments:  No specialty comments available.  Imaging: XR Knee 1-2 Views Right  Result Date: 12/10/2020 2 views of the right knee with the patient standing shows slight valgus malalignment of both knees.  There is lateral compartment narrowing and patellofemoral narrowing but no otherwise acute findings.    PMFS History: Patient Active Problem List   Diagnosis Date Noted  . Hyperthyroidism 04/14/2020  . Heterozygous factor V Leiden mutation ([place]) 09/23/2015  . Lupus anticoagulant positive 09/23/2015  . Varicose veins of both lower extremities 09/23/2015  . Thrombophlebitis of superficial  veins of right lower extremity 04/24/2014   No past medical history on file.  Family History  Problem Relation Age of Onset  . Thyroid cancer Sister      Social History   Occupational History  . Not on file  Tobacco Use  . Smoking status: Never Smoker  . Smokeless tobacco: Never Used  Substance and Sexual Activity  . Alcohol use: Not on file  . Drug use: Not on file  . Sexual activity: Not on file

## 2020-12-26 DIAGNOSIS — L400 Psoriasis vulgaris: Secondary | ICD-10-CM | POA: Diagnosis not present

## 2020-12-26 DIAGNOSIS — Z85828 Personal history of other malignant neoplasm of skin: Secondary | ICD-10-CM | POA: Diagnosis not present

## 2020-12-27 DIAGNOSIS — E079 Disorder of thyroid, unspecified: Secondary | ICD-10-CM | POA: Diagnosis not present

## 2020-12-27 DIAGNOSIS — Z808 Family history of malignant neoplasm of other organs or systems: Secondary | ICD-10-CM | POA: Diagnosis not present

## 2020-12-27 DIAGNOSIS — E0590 Thyrotoxicosis, unspecified without thyrotoxic crisis or storm: Secondary | ICD-10-CM | POA: Diagnosis not present

## 2020-12-27 DIAGNOSIS — Z85850 Personal history of malignant neoplasm of thyroid: Secondary | ICD-10-CM | POA: Diagnosis not present

## 2020-12-27 DIAGNOSIS — E0500 Thyrotoxicosis with diffuse goiter without thyrotoxic crisis or storm: Secondary | ICD-10-CM | POA: Diagnosis not present

## 2020-12-30 DIAGNOSIS — Z01419 Encounter for gynecological examination (general) (routine) without abnormal findings: Secondary | ICD-10-CM | POA: Diagnosis not present

## 2020-12-30 DIAGNOSIS — Z6823 Body mass index (BMI) 23.0-23.9, adult: Secondary | ICD-10-CM | POA: Diagnosis not present

## 2020-12-30 DIAGNOSIS — Z1231 Encounter for screening mammogram for malignant neoplasm of breast: Secondary | ICD-10-CM | POA: Diagnosis not present

## 2021-01-02 DIAGNOSIS — I7300 Raynaud's syndrome without gangrene: Secondary | ICD-10-CM | POA: Diagnosis not present

## 2021-01-02 DIAGNOSIS — E0590 Thyrotoxicosis, unspecified without thyrotoxic crisis or storm: Secondary | ICD-10-CM | POA: Diagnosis not present

## 2021-01-02 DIAGNOSIS — Z808 Family history of malignant neoplasm of other organs or systems: Secondary | ICD-10-CM | POA: Diagnosis not present

## 2021-01-02 DIAGNOSIS — E0500 Thyrotoxicosis with diffuse goiter without thyrotoxic crisis or storm: Secondary | ICD-10-CM | POA: Diagnosis not present

## 2021-01-07 ENCOUNTER — Ambulatory Visit: Payer: Medicare HMO | Admitting: Orthopaedic Surgery

## 2021-01-08 DIAGNOSIS — Z79899 Other long term (current) drug therapy: Secondary | ICD-10-CM | POA: Diagnosis not present

## 2021-01-08 DIAGNOSIS — L400 Psoriasis vulgaris: Secondary | ICD-10-CM | POA: Diagnosis not present

## 2021-01-28 ENCOUNTER — Observation Stay (HOSPITAL_BASED_OUTPATIENT_CLINIC_OR_DEPARTMENT_OTHER)
Admission: EM | Admit: 2021-01-28 | Discharge: 2021-01-29 | Disposition: A | Discharge status: Home / Self Care | Payer: Medicare HMO | Attending: Surgery | Admitting: Surgery

## 2021-01-28 ENCOUNTER — Emergency Department (HOSPITAL_BASED_OUTPATIENT_CLINIC_OR_DEPARTMENT_OTHER): Payer: Medicare HMO

## 2021-01-28 ENCOUNTER — Encounter (HOSPITAL_BASED_OUTPATIENT_CLINIC_OR_DEPARTMENT_OTHER): Payer: Self-pay | Admitting: *Deleted

## 2021-01-28 ENCOUNTER — Other Ambulatory Visit: Payer: Self-pay

## 2021-01-28 DIAGNOSIS — K353 Acute appendicitis with localized peritonitis, without perforation or gangrene: Secondary | ICD-10-CM | POA: Diagnosis not present

## 2021-01-28 DIAGNOSIS — K3580 Unspecified acute appendicitis: Secondary | ICD-10-CM | POA: Diagnosis not present

## 2021-01-28 DIAGNOSIS — Z20822 Contact with and (suspected) exposure to covid-19: Secondary | ICD-10-CM | POA: Insufficient documentation

## 2021-01-28 DIAGNOSIS — R1031 Right lower quadrant pain: Secondary | ICD-10-CM | POA: Diagnosis not present

## 2021-01-28 HISTORY — DX: Dorsalgia, unspecified: M54.9

## 2021-01-28 HISTORY — DX: Migraine, unspecified, not intractable, without status migrainosus: G43.909

## 2021-01-28 HISTORY — DX: Insomnia, unspecified: G47.00

## 2021-01-28 HISTORY — DX: Thyrotoxicosis with diffuse goiter without thyrotoxic crisis or storm: E05.00

## 2021-01-28 LAB — CBC WITH DIFFERENTIAL/PLATELET
Abs Immature Granulocytes: 0.03 10*3/uL (ref 0.00–0.07)
Basophils Absolute: 0 10*3/uL (ref 0.0–0.1)
Basophils Relative: 0 %
Eosinophils Absolute: 0 10*3/uL (ref 0.0–0.5)
Eosinophils Relative: 0 %
HCT: 35.9 % — ABNORMAL LOW (ref 36.0–46.0)
Hemoglobin: 11.6 g/dL — ABNORMAL LOW (ref 12.0–15.0)
Immature Granulocytes: 0 %
Lymphocytes Relative: 5 %
Lymphs Abs: 0.5 10*3/uL — ABNORMAL LOW (ref 0.7–4.0)
MCH: 29.3 pg (ref 26.0–34.0)
MCHC: 32.3 g/dL (ref 30.0–36.0)
MCV: 90.7 fL (ref 80.0–100.0)
Monocytes Absolute: 0.3 10*3/uL (ref 0.1–1.0)
Monocytes Relative: 3 %
Neutro Abs: 8.9 10*3/uL — ABNORMAL HIGH (ref 1.7–7.7)
Neutrophils Relative %: 92 %
Platelets: 280 10*3/uL (ref 150–400)
RBC: 3.96 MIL/uL (ref 3.87–5.11)
RDW: 16.3 % — ABNORMAL HIGH (ref 11.5–15.5)
WBC: 9.7 10*3/uL (ref 4.0–10.5)
nRBC: 0 % (ref 0.0–0.2)

## 2021-01-28 LAB — URINALYSIS, ROUTINE W REFLEX MICROSCOPIC
Bilirubin Urine: NEGATIVE
Glucose, UA: NEGATIVE mg/dL
Hgb urine dipstick: NEGATIVE
Ketones, ur: NEGATIVE mg/dL
Leukocytes,Ua: NEGATIVE
Nitrite: NEGATIVE
Protein, ur: NEGATIVE mg/dL
Specific Gravity, Urine: 1.02 (ref 1.005–1.030)
pH: 6.5 (ref 5.0–8.0)

## 2021-01-28 LAB — COMPREHENSIVE METABOLIC PANEL
ALT: 13 U/L (ref 0–44)
AST: 21 U/L (ref 15–41)
Albumin: 4.3 g/dL (ref 3.5–5.0)
Alkaline Phosphatase: 48 U/L (ref 38–126)
Anion gap: 7 (ref 5–15)
BUN: 14 mg/dL (ref 8–23)
CO2: 26 mmol/L (ref 22–32)
Calcium: 8.9 mg/dL (ref 8.9–10.3)
Chloride: 98 mmol/L (ref 98–111)
Creatinine, Ser: 1.04 mg/dL — ABNORMAL HIGH (ref 0.44–1.00)
GFR, Estimated: 58 mL/min — ABNORMAL LOW (ref >60)
Glucose, Bld: 126 mg/dL — ABNORMAL HIGH (ref 70–99)
Potassium: 3.9 mmol/L (ref 3.5–5.1)
Sodium: 131 mmol/L — ABNORMAL LOW (ref 135–145)
Total Bilirubin: 0.4 mg/dL (ref 0.3–1.2)
Total Protein: 7.3 g/dL (ref 6.5–8.1)

## 2021-01-28 LAB — RESP PANEL BY RT-PCR (FLU A&B, COVID) ARPGX2
Influenza A by PCR: NEGATIVE
Influenza B by PCR: NEGATIVE
SARS Coronavirus 2 by RT PCR: NEGATIVE

## 2021-01-28 LAB — LIPASE, BLOOD: Lipase: 35 U/L (ref 11–51)

## 2021-01-28 MED ORDER — SODIUM CHLORIDE 0.9 % IV BOLUS
1000.0000 mL | Freq: Once | INTRAVENOUS | Status: AC
  Administered 2021-01-28: 1000 mL via INTRAVENOUS

## 2021-01-28 MED ORDER — MORPHINE SULFATE (PF) 4 MG/ML IV SOLN
4.0000 mg | Freq: Once | INTRAVENOUS | Status: AC
  Administered 2021-01-28: 4 mg via INTRAVENOUS
  Filled 2021-01-28: qty 1

## 2021-01-28 MED ORDER — ENOXAPARIN SODIUM 40 MG/0.4ML IJ SOSY
40.0000 mg | PREFILLED_SYRINGE | INTRAMUSCULAR | Status: DC
  Administered 2021-01-28: 40 mg via SUBCUTANEOUS
  Filled 2021-01-28: qty 0.4

## 2021-01-28 MED ORDER — ONDANSETRON HCL 4 MG/2ML IJ SOLN
4.0000 mg | Freq: Once | INTRAMUSCULAR | Status: AC
  Administered 2021-01-28: 4 mg via INTRAVENOUS
  Filled 2021-01-28: qty 2

## 2021-01-28 MED ORDER — METRONIDAZOLE 500 MG/100ML IV SOLN
500.0000 mg | Freq: Once | INTRAVENOUS | Status: AC
  Administered 2021-01-28: 500 mg via INTRAVENOUS
  Filled 2021-01-28: qty 100

## 2021-01-28 MED ORDER — SODIUM CHLORIDE 0.9 % IV SOLN
2.0000 g | Freq: Once | INTRAVENOUS | Status: AC
  Administered 2021-01-28: 2 g via INTRAVENOUS
  Filled 2021-01-28: qty 20

## 2021-01-28 MED ORDER — IOHEXOL 300 MG/ML  SOLN
75.0000 mL | Freq: Once | INTRAMUSCULAR | Status: AC | PRN
  Administered 2021-01-28: 75 mL via INTRAVENOUS

## 2021-01-28 NOTE — H&P (Signed)
Kathleen Peters is an 70 y.o. female.   Chief Complaint: abdominal pain HPI: Pt is a 70 yo F who presented to the [facility] ED with abdominal pain.  This began around lunchtime yesterday and was diffuse vague abdominal pain.  This quickly localized and intensified in the right lower quadrant which caused her to present to the [place].  She notes that this radiates to her back.  Denies nausea.  The pain is worse this morning than it was yesterday.  Previous abdominal surgery includes laparoscopic hysterectomy.  She endorses a history of Graves' disease, notes that her clotting factors have never been a significant problem.  She is not on any anticoagulation.  Past Medical History:  Diagnosis Date  . Back pain   . Graves disease   . Insomnia   . Migraines   factor V leiden mutation (heterozygous) Lupus anticoagulant  Past Surgical History:  Procedure Laterality Date  . ABDOMINAL HYSTERECTOMY    . ELBOW FRACTURE SURGERY    . TONSILLECTOMY    . TYMPANOSTOMY TUBE PLACEMENT      Family History  Problem Relation Age of Onset  . Thyroid cancer Sister    Social History:  reports that she has never smoked. She has never used smokeless tobacco. She reports current alcohol use. She reports previous drug use.  Allergies: No Known Allergies  Meds: relpax Atenolol Estrace Tramadol ambien methimizole Vitamin d Fish oil   Results for orders placed or performed during the hospital encounter of 01/28/21 (from the past 48 hour(s))  Urinalysis, Routine w reflex microscopic Urine, Clean Catch     Status: None   Collection Time: 01/28/21  5:09 PM  Result Value Ref Range   Color, Urine YELLOW YELLOW   APPearance CLEAR CLEAR   Specific Gravity, Urine 1.020 1.005 - 1.030   pH 6.5 5.0 - 8.0   Glucose, UA NEGATIVE NEGATIVE mg/dL   Hgb urine dipstick NEGATIVE NEGATIVE   Bilirubin Urine NEGATIVE NEGATIVE   Ketones, ur NEGATIVE NEGATIVE mg/dL   Protein, ur NEGATIVE NEGATIVE mg/dL    Nitrite NEGATIVE NEGATIVE   Leukocytes,Ua NEGATIVE NEGATIVE    Comment: Microscopic not done on urines with negative protein, blood, leukocytes, nitrite, or glucose < 500 mg/dL. Performed at [facility], [address] [place], [place] [address]   Comprehensive metabolic panel     Status: Abnormal   Collection Time: 01/28/21  5:24 PM  Result Value Ref Range   Sodium 131 (L) 135 - 145 mmol/L   Potassium 3.9 3.5 - 5.1 mmol/L   Chloride 98 98 - 111 mmol/L   CO2 26 22 - 32 mmol/L   Glucose, Bld 126 (H) 70 - 99 mg/dL    Comment: Glucose reference range applies only to samples taken after fasting for at least 8 hours.   BUN 14 8 - 23 mg/dL   Creatinine, Ser 1.04 (H) 0.44 - 1.00 mg/dL   Calcium 8.9 8.9 - 10.3 mg/dL   Total Protein 7.3 6.5 - 8.1 g/dL   Albumin 4.3 3.5 - 5.0 g/dL   AST 21 15 - 41 U/L   ALT 13 0 - 44 U/L   Alkaline Phosphatase 48 38 - 126 U/L   Total Bilirubin 0.4 0.3 - 1.2 mg/dL   GFR, Estimated 58 (L) >60 mL/min    Comment: (NOTE) Calculated using the CKD-EPI Creatinine Equation (2021)    Anion gap 7 5 - 15    Comment: Performed at [facility], [address] [place], [place] [address]  Lipase, blood     Status: None   Collection Time: 01/28/21  5:24 PM  Result Value Ref Range   Lipase 35 11 - 51 U/L    Comment: Performed at [facility], [address] [place], [place] [address]  CBC with Diff     Status: Abnormal   Collection Time: 01/28/21  5:24 PM  Result Value Ref Range   WBC 9.7 4.0 - 10.5 K/uL   RBC 3.96 3.87 - 5.11 MIL/uL   Hemoglobin 11.6 (L) 12.0 - 15.0 g/dL   HCT 35.9 (L) 36.0 - 46.0 %   MCV 90.7 80.0 - 100.0 fL   MCH 29.3 26.0 - 34.0 pg   MCHC 32.3 30.0 - 36.0 g/dL   RDW 16.3 (H) 11.5 - 15.5 %   Platelets 280 150 - 400 K/uL   nRBC 0.0 0.0 - 0.2 %   Neutrophils Relative % 92 %   Neutro Abs 8.9 (H) 1.7 - 7.7 K/uL   Lymphocytes Relative 5 %   Lymphs Abs 0.5 (L) 0.7 - 4.0 K/uL   Monocytes Relative 3 %    Monocytes Absolute 0.3 0.1 - 1.0 K/uL   Eosinophils Relative 0 %   Eosinophils Absolute 0.0 0.0 - 0.5 K/uL   Basophils Relative 0 %   Basophils Absolute 0.0 0.0 - 0.1 K/uL   Immature Granulocytes 0 %   Abs Immature Granulocytes 0.03 0.00 - 0.07 K/uL    Comment: Performed at [facility], [address] [place], [place] [address]  Resp Panel by RT-PCR (Flu A&B, Covid) Nasopharyngeal Swab     Status: None   Collection Time: 01/28/21  8:13 PM   Specimen: Nasopharyngeal Swab; Nasopharyngeal(NP) swabs in vial transport medium  Result Value Ref Range   SARS Coronavirus 2 by RT PCR NEGATIVE NEGATIVE    Comment: (NOTE) SARS-CoV-2 target nucleic acids are NOT DETECTED.  The SARS-CoV-2 RNA is generally detectable in upper respiratory specimens during the acute phase of infection. The lowest concentration of SARS-CoV-2 viral copies this assay can detect is 138 copies/mL. A negative result does not preclude SARS-Cov-2 infection and should not be used as the sole basis for treatment or other patient management decisions. A negative result may occur with  improper specimen collection/handling, submission of specimen other than nasopharyngeal swab, presence of viral mutation(s) within the areas targeted by this assay, and inadequate number of viral copies(<138 copies/mL). A negative result must be combined with clinical observations, patient history, and epidemiological information. The expected result is Negative.  Fact Sheet for Patients:  EntrepreneurPulse.com.au  Fact Sheet for Healthcare Providers:  IncredibleEmployment.be  This test is no t yet approved or cleared by the [place] FDA and  has been authorized for detection and/or diagnosis of SARS-CoV-2 by FDA under an Emergency Use Authorization (EUA). This EUA will remain  in effect (meaning this test can be used) for the duration of the COVID-19 declaration under Section 564(b)(1) of  the Act, 21 U.S.C.section 360bbb-3(b)(1), unless the authorization is terminated  or revoked sooner.       Influenza A by PCR NEGATIVE NEGATIVE   Influenza B by PCR NEGATIVE NEGATIVE    Comment: (NOTE) The Xpert Xpress SARS-CoV-2/FLU/RSV plus assay is intended as an aid in the diagnosis of influenza from Nasopharyngeal swab specimens and should not be used as a sole basis for treatment. Nasal washings and aspirates are unacceptable for Xpert  Xpress SARS-CoV-2/FLU/RSV testing.  Fact Sheet for Patients: EntrepreneurPulse.com.au  Fact Sheet for Healthcare Providers: IncredibleEmployment.be  This test is not yet approved or cleared by the [place] FDA and has been authorized for detection and/or diagnosis of SARS-CoV-2 by FDA under an Emergency Use Authorization (EUA). This EUA will remain in effect (meaning this test can be used) for the duration of the COVID-19 declaration under Section 564(b)(1) of the Act, 21 U.S.C. section 360bbb-3(b)(1), unless the authorization is terminated or revoked.  Performed at [facility], [address] [place], [place] [address]    CT ABDOMEN PELVIS W CONTRAST  Result Date: 01/28/2021 CLINICAL DATA:  Right lower quadrant pain. EXAM: CT ABDOMEN AND PELVIS WITH CONTRAST TECHNIQUE: Multidetector CT imaging of the abdomen and pelvis was performed using the standard protocol following bolus administration of intravenous contrast. CONTRAST:  61mL OMNIPAQUE IOHEXOL 300 MG/ML  SOLN COMPARISON:  February 12, 2020 FINDINGS: Lower chest: No acute abnormality. Hepatobiliary: Innumerable cysts of various sizes are seen scattered throughout the liver. No gallstones, gallbladder wall thickening, or biliary dilatation. Pancreas: A stable 6 mm area of low attenuation is noted at the junction of the pancreatic body and tail (axial CT image 19, CT series 2). No pancreatic ductal dilatation or surrounding inflammatory  changes. Spleen: Normal in size without focal abnormality. Adrenals/Urinary Tract: Adrenal glands are unremarkable. Kidneys are normal in size, without renal calculi, focal or hydronephrosis. 2.7 cm x 2.4 cm and 1.0 cm diameter cysts are seen along the posterior and posterolateral aspect of the mid left kidney. Bladder is unremarkable. Stomach/Bowel: Stomach is within normal limits. A dilated (1.3 cm) and inflamed appendix is noted (axial CT images 51 through 57, CT series 2). There is no evidence of associated perforation or abscess. No evidence of bowel dilatation. Vascular/Lymphatic: No significant vascular findings are present. No enlarged abdominal or pelvic lymph nodes. Reproductive: Status post hysterectomy. No adnexal masses. Other: No abdominal wall hernia or abnormality. No abdominopelvic ascites. Musculoskeletal: No acute or significant osseous findings. IMPRESSION: 1. Findings consistent with appendicitis. 2. Multiple stable hepatic and left renal cysts. 3. Stable subcentimeter nonaggressive low-attenuation pancreatic lesion. Follow-up MRI (preferred) or CT abdomen without and with IV contrast is indicated in 2 years. This recommendation follows ACR consensus guidelines: Management of Incidental pancreatic Cysts: A White Paper of the ACR Incidental Findings committee. [place] 3007;62:[id number]. Electronically Signed   By: Virgina Norfolk M.D.   On: 01/28/2021 20:00    Review of Systems  Constitutional: Negative.   HENT: Negative.   Eyes: Negative.   Respiratory: Negative.   Cardiovascular: Negative.   Gastrointestinal: Positive for nausea and vomiting.  Endocrine: Negative.   Genitourinary: Negative.   Musculoskeletal: Negative.   Skin: Negative.   Neurological: Facial asymmetry: Received   All other systems reviewed and are negative.   Blood pressure (!) 149/55, pulse (!) 57, temperature 97.6 F (36.4 C), temperature source Oral, resp. rate 18, height 5\' 4"  (1.626 m), weight  64 kg, SpO2 100 %. Physical Exam Constitutional:      General: She is in acute distress (looks uncomfortable).     Appearance: She is well-developed.  HENT:     Head: Normocephalic and atraumatic.     Mouth/Throat:     Mouth: Mucous membranes are moist.  Eyes:     General: No scleral icterus.    Extraocular Movements: Extraocular movements intact.  Cardiovascular:     Rate and Rhythm: Normal rate and regular rhythm.  Heart sounds: Normal heart sounds.  Pulmonary:     Effort: Pulmonary effort is normal.     Breath sounds: Normal breath sounds.  Abdominal:     General: Abdomen is flat. Bowel sounds are decreased.     Palpations: Abdomen is soft.     Tenderness: There is abdominal tenderness in the right lower quadrant.     Hernia: No hernia is present.  Skin:    General: Skin is warm and dry.     Capillary Refill: Capillary refill takes 2 to 3 seconds.     Coloration: Skin is not cyanotic or jaundiced.  Neurological:     General: No focal deficit present.     Mental Status: She is alert.  Psychiatric:        Mood and Affect: Mood normal.        Behavior: Behavior normal.      Assessment/Plan Acute appendicitis- I recommend proceeding with laparoscopic appendectomy. We discussed the surgery including risks of bleeding, infection, pain, scarring, injury to intra-abdominal structures, conversion to open surgery or more extensive resection, risk of staple line leak or delayed abscess, failure to resolve symptoms, postoperative ileus, incisional hernia, as well as general risks of DVT/PE, pneumonia, stroke, heart attack, death. Questions were welcomed and answered to the patient's satisfaction. We'll proceed to the operating room today.  IV antibiotics    Received dose of lovenox given history of factor V leiden mutation Continue beta blockade and methimazole.      Clovis Riley MD 8:35 AM 01/29/2021

## 2021-01-28 NOTE — ED Triage Notes (Signed)
abd x 5 hrs with nausea only

## 2021-01-28 NOTE — ED Notes (Signed)
Patient transported to CT 

## 2021-01-28 NOTE — ED Notes (Signed)
Pt declined this RN contact her husband, says she will text him later

## 2021-01-28 NOTE — ED Provider Notes (Signed)
[facility] Provider Note   CSN: [id number] Arrival date & time: 01/28/21  1709     History Chief Complaint  Patient presents with  . Abdominal Pain    Kathleen Peters is a 70 y.o. female.  HPI 70 year old female presents with abdominal pain and nausea.  Started around noon where she had nonspecific abdominal pain and back pain.  Now it is more in the right lower quadrant.  She is also had nausea without vomiting.  Some loose stools though not really diarrhea.  No fevers, chest pain or cough.  No urinary symptoms.  She rates the pain is a 9.   Past Medical History:  Diagnosis Date  . Back pain   . Graves disease   . Insomnia   . Migraines     Patient Active Problem List   Diagnosis Date Noted  . Acute appendicitis 01/28/2021  . Hyperthyroidism 04/14/2020  . Heterozygous factor V Leiden mutation ([place]) 09/23/2015  . Lupus anticoagulant positive 09/23/2015  . Varicose veins of both lower extremities 09/23/2015  . Thrombophlebitis of superficial veins of right lower extremity 04/24/2014    Past Surgical History:  Procedure Laterality Date  . ABDOMINAL HYSTERECTOMY    . ELBOW FRACTURE SURGERY    . TONSILLECTOMY    . TYMPANOSTOMY TUBE PLACEMENT       OB History   No obstetric history on file.     Family History  Problem Relation Age of Onset  . Thyroid cancer Sister     Social History   Tobacco Use  . Smoking status: Never Smoker  . Smokeless tobacco: Never Used  Substance Use Topics  . Alcohol use: Yes    Comment: soc  . Drug use: Not Currently    Home Medications Prior to Admission medications   Medication Sig Start Date End Date Taking? Authorizing Provider  atenolol (TENORMIN) 25 MG tablet Take 25 mg by mouth every other day. 12/02/17   [provider]  Cholecalciferol (VITAMIN D3) 2000 units capsule Take 2,000 Units by mouth daily.     [provider]  eletriptan (RELPAX) 40 MG tablet Take 40 mg by  mouth as needed for migraine.  12/10/17   [provider]  estradiol (ESTRACE) 1 MG tablet Take 1 mg by mouth daily.  07/11/15   [provider]  LORazepam (ATIVAN) 1 MG tablet Take 1 mg by mouth at bedtime as needed. 11/06/17   [provider]  methimazole (TAPAZOLE) 10 MG tablet Take 1 tablet (10 mg total) by mouth daily. 07/22/20   Renato Shin, MD  Multiple Vitamin (MULTI-VITAMINS) TABS Take 1 tablet by mouth daily.     [provider]  Omega-3 Fatty Acids (FISH OIL) 1200 MG CAPS Take 1 capsule by mouth daily.     [provider]  traMADol (ULTRAM) 50 MG tablet Take 50 mg by mouth as needed for moderate pain.  10/16/19   [provider]  zolpidem (AMBIEN) 10 MG tablet  02/19/20   [provider]    Allergies    Patient has no known allergies.  Review of Systems   Review of Systems  Constitutional: Negative for fever.  Gastrointestinal: Positive for abdominal pain, diarrhea and nausea. Negative for vomiting.  Genitourinary: Negative for dysuria and hematuria.  Musculoskeletal: Positive for back pain.  All other systems reviewed and are negative.   Physical Exam Updated Vital Signs BP (!) 149/55   Pulse (!) 57   Temp 97.6 F (  36.4 C) (Oral)   Resp 18   Ht 5\' 4"  (1.626 m)   Wt 64 kg   SpO2 100%   BMI 24.20 kg/m   Physical Exam Vitals and nursing note reviewed.  Constitutional:      General: She is not in acute distress.    Appearance: She is well-developed. She is not ill-appearing or diaphoretic.  HENT:     Head: Normocephalic and atraumatic.     Right Ear: External ear normal.     Left Ear: External ear normal.     Nose: Nose normal.  Eyes:     General:        Right eye: No discharge.        Left eye: No discharge.  Cardiovascular:     Rate and Rhythm: Normal rate and regular rhythm.     Heart sounds: Normal heart sounds.  Pulmonary:     Effort: Pulmonary effort is normal.     Breath sounds: Normal  breath sounds.  Abdominal:     Palpations: Abdomen is soft.     Tenderness: There is abdominal tenderness (worst in RLQ) in the right lower quadrant and left lower quadrant. There is no right CVA tenderness or left CVA tenderness.  Skin:    General: Skin is warm and dry.  Neurological:     Mental Status: She is alert.  Psychiatric:        Mood and Affect: Mood is not anxious.     ED Results / Procedures / Treatments   Labs (all labs ordered are listed, but only abnormal results are displayed) Labs Reviewed  COMPREHENSIVE METABOLIC PANEL - Abnormal; Notable for the following components:      Result Value   Sodium 131 (*)    Glucose, Bld 126 (*)    Creatinine, Ser 1.04 (*)    GFR, Estimated 58 (*)    All other components within normal limits  CBC WITH DIFFERENTIAL/PLATELET - Abnormal; Notable for the following components:   Hemoglobin 11.6 (*)    HCT 35.9 (*)    RDW 16.3 (*)    Neutro Abs 8.9 (*)    Lymphs Abs 0.5 (*)    All other components within normal limits  RESP PANEL BY RT-PCR (FLU A&B, COVID) ARPGX2  LIPASE, BLOOD  URINALYSIS, ROUTINE W REFLEX MICROSCOPIC    EKG None  Radiology CT ABDOMEN PELVIS W CONTRAST  Result Date: 01/28/2021 CLINICAL DATA:  Right lower quadrant pain. EXAM: CT ABDOMEN AND PELVIS WITH CONTRAST TECHNIQUE: Multidetector CT imaging of the abdomen and pelvis was performed using the standard protocol following bolus administration of intravenous contrast. CONTRAST:  56mL OMNIPAQUE IOHEXOL 300 MG/ML  SOLN COMPARISON:  February 12, 2020 FINDINGS: Lower chest: No acute abnormality. Hepatobiliary: Innumerable cysts of various sizes are seen scattered throughout the liver. No gallstones, gallbladder wall thickening, or biliary dilatation. Pancreas: A stable 6 mm area of low attenuation is noted at the junction of the pancreatic body and tail (axial CT image 19, CT series 2). No pancreatic ductal dilatation or surrounding inflammatory changes. Spleen: Normal in  size without focal abnormality. Adrenals/Urinary Tract: Adrenal glands are unremarkable. Kidneys are normal in size, without renal calculi, focal or hydronephrosis. 2.7 cm x 2.4 cm and 1.0 cm diameter cysts are seen along the posterior and posterolateral aspect of the mid left kidney. Bladder is unremarkable. Stomach/Bowel: Stomach is within normal limits. A dilated (1.3 cm) and inflamed appendix is noted (axial CT images 51 through 57, CT series 2).  There is no evidence of associated perforation or abscess. No evidence of bowel dilatation. Vascular/Lymphatic: No significant vascular findings are present. No enlarged abdominal or pelvic lymph nodes. Reproductive: Status post hysterectomy. No adnexal masses. Other: No abdominal wall hernia or abnormality. No abdominopelvic ascites. Musculoskeletal: No acute or significant osseous findings. IMPRESSION: 1. Findings consistent with appendicitis. 2. Multiple stable hepatic and left renal cysts. 3. Stable subcentimeter nonaggressive low-attenuation pancreatic lesion. Follow-up MRI (preferred) or CT abdomen without and with IV contrast is indicated in 2 years. This recommendation follows ACR consensus guidelines: Management of Incidental pancreatic Cysts: A White Paper of the ACR Incidental Findings committee. [place] 5974;16:384-536. Electronically Signed   By: Virgina Norfolk M.D.   On: 01/28/2021 20:00    Procedures Procedures   Medications Ordered in ED Medications  enoxaparin (LOVENOX) injection 40 mg (40 mg Subcutaneous Given 01/28/21 2135)  sodium chloride 0.9 % bolus 1,000 mL (0 mLs Intravenous Stopped 01/28/21 2021)  iohexol (OMNIPAQUE) 300 MG/ML solution 75 mL (75 mLs Intravenous Contrast Given 01/28/21 1920)  cefTRIAXone (ROCEPHIN) 2 g in sodium chloride 0.9 % 100 mL IVPB (0 g Intravenous Stopped 01/28/21 2102)    And  metroNIDAZOLE (FLAGYL) IVPB 500 mg (0 mg Intravenous Stopped 01/28/21 2221)  morphine 4 MG/ML injection 4 mg (4 mg Intravenous  Given 01/28/21 2115)    ED Course  I have reviewed the triage vital signs and the nursing notes.  Pertinent labs & imaging results that were available during my care of the patient were reviewed by me and considered in my medical decision making (see chart for details).    MDM Rules/Calculators/A&P                          CT confirms acute appendicitis. No complications at this time. Will keep NPO. Initially declined medicine, then later asking so was given morphine. Discussed with Dr. Barry Dienes, who will admit to [facility].  Final Clinical Impression(s) / ED Diagnoses Final diagnoses:  Acute appendicitis with localized peritonitis, without perforation, abscess, or gangrene    Rx / DC Orders ED Discharge Orders    None       Sherwood Gambler, MD 01/28/21 2310

## 2021-01-29 ENCOUNTER — Observation Stay (HOSPITAL_COMMUNITY): Payer: Medicare HMO | Admitting: Anesthesiology

## 2021-01-29 ENCOUNTER — Encounter (HOSPITAL_COMMUNITY)
Admission: EM | Disposition: A | Discharge status: Home / Self Care | Payer: Self-pay | Source: Home / Self Care | Attending: Emergency Medicine

## 2021-01-29 ENCOUNTER — Encounter (HOSPITAL_COMMUNITY): Payer: Self-pay

## 2021-01-29 DIAGNOSIS — E0590 Thyrotoxicosis, unspecified without thyrotoxic crisis or storm: Secondary | ICD-10-CM | POA: Diagnosis not present

## 2021-01-29 DIAGNOSIS — I8393 Asymptomatic varicose veins of bilateral lower extremities: Secondary | ICD-10-CM | POA: Diagnosis not present

## 2021-01-29 DIAGNOSIS — K353 Acute appendicitis with localized peritonitis, without perforation or gangrene: Secondary | ICD-10-CM | POA: Diagnosis not present

## 2021-01-29 DIAGNOSIS — R1031 Right lower quadrant pain: Secondary | ICD-10-CM | POA: Diagnosis present

## 2021-01-29 DIAGNOSIS — I10 Essential (primary) hypertension: Secondary | ICD-10-CM | POA: Diagnosis not present

## 2021-01-29 DIAGNOSIS — Z20822 Contact with and (suspected) exposure to covid-19: Secondary | ICD-10-CM | POA: Diagnosis not present

## 2021-01-29 DIAGNOSIS — K3580 Unspecified acute appendicitis: Secondary | ICD-10-CM | POA: Diagnosis not present

## 2021-01-29 HISTORY — PX: LAPAROSCOPIC APPENDECTOMY: SHX408

## 2021-01-29 LAB — SURGICAL PCR SCREEN
MRSA, PCR: POSITIVE — AB
Staphylococcus aureus: POSITIVE — AB

## 2021-01-29 LAB — BASIC METABOLIC PANEL
Anion gap: 7 (ref 5–15)
BUN: 9 mg/dL (ref 8–23)
CO2: 25 mmol/L (ref 22–32)
Calcium: 8.4 mg/dL — ABNORMAL LOW (ref 8.9–10.3)
Chloride: 104 mmol/L (ref 98–111)
Creatinine, Ser: 1.1 mg/dL — ABNORMAL HIGH (ref 0.44–1.00)
GFR, Estimated: 54 mL/min — ABNORMAL LOW (ref >60)
Glucose, Bld: 148 mg/dL — ABNORMAL HIGH (ref 70–99)
Potassium: 3.6 mmol/L (ref 3.5–5.1)
Sodium: 136 mmol/L (ref 135–145)

## 2021-01-29 LAB — CBC
HCT: 32.8 % — ABNORMAL LOW (ref 36.0–46.0)
Hemoglobin: 10.5 g/dL — ABNORMAL LOW (ref 12.0–15.0)
MCH: 29.3 pg (ref 26.0–34.0)
MCHC: 32 g/dL (ref 30.0–36.0)
MCV: 91.6 fL (ref 80.0–100.0)
Platelets: 201 10*3/uL (ref 150–400)
RBC: 3.58 MIL/uL — ABNORMAL LOW (ref 3.87–5.11)
RDW: 16.8 % — ABNORMAL HIGH (ref 11.5–15.5)
WBC: 8.7 10*3/uL (ref 4.0–10.5)
nRBC: 0 % (ref 0.0–0.2)

## 2021-01-29 LAB — HIV ANTIBODY (ROUTINE TESTING W REFLEX): HIV Screen 4th Generation wRfx: NONREACTIVE

## 2021-01-29 SURGERY — APPENDECTOMY, LAPAROSCOPIC
Anesthesia: General

## 2021-01-29 MED ORDER — DEXAMETHASONE SODIUM PHOSPHATE 10 MG/ML IJ SOLN
INTRAMUSCULAR | Status: AC
  Filled 2021-01-29: qty 1

## 2021-01-29 MED ORDER — CHLORHEXIDINE GLUCONATE CLOTH 2 % EX PADS
6.0000 | MEDICATED_PAD | Freq: Every day | CUTANEOUS | Status: DC
  Administered 2021-01-29: 6 via TOPICAL

## 2021-01-29 MED ORDER — DIPHENHYDRAMINE HCL 12.5 MG/5ML PO ELIX: 12.5000 mg | ORAL_SOLUTION | Freq: Four times a day (QID) | ORAL | Status: DC | PRN

## 2021-01-29 MED ORDER — FENTANYL CITRATE (PF) 100 MCG/2ML IJ SOLN
INTRAMUSCULAR | Status: DC | PRN
  Administered 2021-01-29: 50 ug via INTRAVENOUS
  Administered 2021-01-29 (×2): 25 ug via INTRAVENOUS

## 2021-01-29 MED ORDER — KCL IN DEXTROSE-NACL 20-5-0.45 MEQ/L-%-% IV SOLN
INTRAVENOUS | Status: DC
  Filled 2021-01-29 (×2): qty 1000

## 2021-01-29 MED ORDER — ONDANSETRON 4 MG PO TBDP: 4.0000 mg | ORAL_TABLET | Freq: Four times a day (QID) | ORAL | Status: DC | PRN

## 2021-01-29 MED ORDER — LIDOCAINE 2% (20 MG/ML) 5 ML SYRINGE
INTRAMUSCULAR | Status: DC | PRN
  Administered 2021-01-29: 100 mg via INTRAVENOUS

## 2021-01-29 MED ORDER — TRAMADOL HCL 50 MG PO TABS
50.0000 mg | ORAL_TABLET | Freq: Four times a day (QID) | ORAL | Status: DC | PRN
Start: 2021-01-29 — End: 2021-01-29

## 2021-01-29 MED ORDER — ELETRIPTAN HYDROBROMIDE 40 MG PO TABS
40.0000 mg | ORAL_TABLET | ORAL | Status: DC | PRN
  Filled 2021-01-29: qty 1

## 2021-01-29 MED ORDER — PROPOFOL 10 MG/ML IV BOLUS
INTRAVENOUS | Status: AC
  Filled 2021-01-29: qty 20

## 2021-01-29 MED ORDER — ESTRADIOL 1 MG PO TABS
1.0000 mg | ORAL_TABLET | Freq: Every day | ORAL | Status: DC
  Filled 2021-01-29: qty 1

## 2021-01-29 MED ORDER — ATENOLOL 25 MG PO TABS: 25.0000 mg | ORAL_TABLET | ORAL | Status: DC

## 2021-01-29 MED ORDER — PROMETHAZINE HCL 25 MG/ML IJ SOLN: 6.2500 mg | INTRAMUSCULAR | Status: DC | PRN

## 2021-01-29 MED ORDER — ALBUMIN HUMAN 5 % IV SOLN: INTRAVENOUS | Status: DC | PRN

## 2021-01-29 MED ORDER — SCOPOLAMINE 1 MG/3DAYS TD PT72
MEDICATED_PATCH | TRANSDERMAL | Status: AC
  Filled 2021-01-29: qty 1

## 2021-01-29 MED ORDER — ONDANSETRON HCL 4 MG/2ML IJ SOLN
INTRAMUSCULAR | Status: AC
  Filled 2021-01-29: qty 2

## 2021-01-29 MED ORDER — ACETAMINOPHEN 500 MG PO TABS
1000.0000 mg | ORAL_TABLET | Freq: Once | ORAL | Status: AC
  Administered 2021-01-29: 1000 mg via ORAL
  Filled 2021-01-29: qty 2

## 2021-01-29 MED ORDER — LACTATED RINGERS IV SOLN: INTRAVENOUS | Status: DC | PRN

## 2021-01-29 MED ORDER — LACTATED RINGERS IR SOLN
Status: DC | PRN
  Administered 2021-01-29: 1000 mL

## 2021-01-29 MED ORDER — SUGAMMADEX SODIUM 200 MG/2ML IV SOLN
INTRAVENOUS | Status: DC | PRN
  Administered 2021-01-29: 200 mg via INTRAVENOUS

## 2021-01-29 MED ORDER — LIP MEDEX EX OINT
TOPICAL_OINTMENT | CUTANEOUS | Status: AC
  Administered 2021-01-29: 1
  Filled 2021-01-29: qty 7

## 2021-01-29 MED ORDER — PHENYLEPHRINE 40 MCG/ML (10ML) SYRINGE FOR IV PUSH (FOR BLOOD PRESSURE SUPPORT)
PREFILLED_SYRINGE | INTRAVENOUS | Status: DC | PRN
  Administered 2021-01-29 (×2): 80 ug via INTRAVENOUS

## 2021-01-29 MED ORDER — PROPOFOL 10 MG/ML IV BOLUS
INTRAVENOUS | Status: DC | PRN
  Administered 2021-01-29: 130 mg via INTRAVENOUS

## 2021-01-29 MED ORDER — BUPIVACAINE-EPINEPHRINE 0.25% -1:200000 IJ SOLN
INTRAMUSCULAR | Status: DC | PRN
  Administered 2021-01-29: 30 mL

## 2021-01-29 MED ORDER — MORPHINE SULFATE (PF) 2 MG/ML IV SOLN
1.0000 mg | INTRAVENOUS | Status: DC | PRN
  Administered 2021-01-29 (×3): 2 mg via INTRAVENOUS
  Filled 2021-01-29 (×3): qty 1

## 2021-01-29 MED ORDER — METHIMAZOLE 10 MG PO TABS
10.0000 mg | ORAL_TABLET | Freq: Every day | ORAL | Status: DC
  Filled 2021-01-29: qty 1

## 2021-01-29 MED ORDER — DEXAMETHASONE SODIUM PHOSPHATE 10 MG/ML IJ SOLN
INTRAMUSCULAR | Status: DC | PRN
  Administered 2021-01-29: 5 mg via INTRAVENOUS

## 2021-01-29 MED ORDER — LORAZEPAM 1 MG PO TABS: 1.0000 mg | ORAL_TABLET | Freq: Two times a day (BID) | ORAL | Status: DC | PRN

## 2021-01-29 MED ORDER — POLYETHYLENE GLYCOL 3350 17 G PO PACK: 17.0000 g | PACK | Freq: Every day | ORAL | 0 refills | Status: DC | PRN

## 2021-01-29 MED ORDER — ACETAMINOPHEN 500 MG PO TABS
1000.0000 mg | ORAL_TABLET | Freq: Four times a day (QID) | ORAL | Status: DC
  Administered 2021-01-29: 1000 mg via ORAL
  Filled 2021-01-29: qty 2

## 2021-01-29 MED ORDER — ACETAMINOPHEN 650 MG RE SUPP: 650.0000 mg | Freq: Four times a day (QID) | RECTAL | Status: DC | PRN

## 2021-01-29 MED ORDER — SODIUM CHLORIDE 0.9 % IR SOLN
Status: DC | PRN
  Administered 2021-01-29: 1000 mL

## 2021-01-29 MED ORDER — FENTANYL CITRATE (PF) 100 MCG/2ML IJ SOLN: 25.0000 ug | INTRAMUSCULAR | Status: DC | PRN

## 2021-01-29 MED ORDER — ACETAMINOPHEN 325 MG PO TABS: 650.0000 mg | ORAL_TABLET | Freq: Four times a day (QID) | ORAL | Status: DC | PRN

## 2021-01-29 MED ORDER — BUPIVACAINE-EPINEPHRINE (PF) 0.25% -1:200000 IJ SOLN
INTRAMUSCULAR | Status: AC
  Filled 2021-01-29: qty 30

## 2021-01-29 MED ORDER — PROCHLORPERAZINE MALEATE 10 MG PO TABS
10.0000 mg | ORAL_TABLET | Freq: Four times a day (QID) | ORAL | Status: DC | PRN
  Filled 2021-01-29: qty 1

## 2021-01-29 MED ORDER — SCOPOLAMINE 1 MG/3DAYS TD PT72
MEDICATED_PATCH | TRANSDERMAL | Status: DC | PRN
  Administered 2021-01-29: 1 via TRANSDERMAL

## 2021-01-29 MED ORDER — DOCUSATE SODIUM 100 MG PO CAPS: 100.0000 mg | ORAL_CAPSULE | Freq: Two times a day (BID) | ORAL | Status: DC

## 2021-01-29 MED ORDER — SUCCINYLCHOLINE CHLORIDE 200 MG/10ML IV SOSY
PREFILLED_SYRINGE | INTRAVENOUS | Status: DC | PRN
  Administered 2021-01-29: 140 mg via INTRAVENOUS

## 2021-01-29 MED ORDER — ROCURONIUM BROMIDE 10 MG/ML (PF) SYRINGE
PREFILLED_SYRINGE | INTRAVENOUS | Status: DC | PRN
  Administered 2021-01-29: 40 mg via INTRAVENOUS
  Administered 2021-01-29: 20 mg via INTRAVENOUS

## 2021-01-29 MED ORDER — PIPERACILLIN-TAZOBACTAM 3.375 G IVPB
3.3750 g | Freq: Three times a day (TID) | INTRAVENOUS | Status: DC
  Administered 2021-01-29 (×2): 3.375 g via INTRAVENOUS
  Filled 2021-01-29 (×2): qty 50

## 2021-01-29 MED ORDER — ROCURONIUM BROMIDE 10 MG/ML (PF) SYRINGE
PREFILLED_SYRINGE | INTRAVENOUS | Status: AC
  Filled 2021-01-29: qty 10

## 2021-01-29 MED ORDER — MUPIROCIN 2 % EX OINT
1.0000 "application " | TOPICAL_OINTMENT | Freq: Two times a day (BID) | CUTANEOUS | Status: DC
  Administered 2021-01-29: 1 via NASAL
  Filled 2021-01-29: qty 22

## 2021-01-29 MED ORDER — CELECOXIB 200 MG PO CAPS
200.0000 mg | ORAL_CAPSULE | Freq: Once | ORAL | Status: AC
  Administered 2021-01-29: 200 mg via ORAL
  Filled 2021-01-29: qty 1

## 2021-01-29 MED ORDER — PROCHLORPERAZINE EDISYLATE 10 MG/2ML IJ SOLN: 5.0000 mg | Freq: Four times a day (QID) | INTRAMUSCULAR | Status: DC | PRN

## 2021-01-29 MED ORDER — ONDANSETRON HCL 4 MG/2ML IJ SOLN
INTRAMUSCULAR | Status: DC | PRN
  Administered 2021-01-29: 4 mg via INTRAVENOUS

## 2021-01-29 MED ORDER — DIPHENHYDRAMINE HCL 50 MG/ML IJ SOLN: 12.5000 mg | Freq: Four times a day (QID) | INTRAMUSCULAR | Status: DC | PRN

## 2021-01-29 MED ORDER — ONDANSETRON HCL 4 MG/2ML IJ SOLN: 4.0000 mg | Freq: Four times a day (QID) | INTRAMUSCULAR | Status: DC | PRN

## 2021-01-29 MED ORDER — AMISULPRIDE (ANTIEMETIC) 5 MG/2ML IV SOLN: 10.0000 mg | Freq: Once | INTRAVENOUS | Status: DC | PRN

## 2021-01-29 MED ORDER — FENTANYL CITRATE (PF) 100 MCG/2ML IJ SOLN
INTRAMUSCULAR | Status: AC
  Filled 2021-01-29: qty 2

## 2021-01-29 MED ORDER — TRAMADOL HCL 50 MG PO TABS: 50.0000 mg | ORAL_TABLET | Freq: Three times a day (TID) | ORAL | Status: DC | PRN

## 2021-01-29 SURGICAL SUPPLY — 53 items
ADH SKN CLS APL DERMABOND .7 (GAUZE/BANDAGES/DRESSINGS) ×1
APL PRP STRL LF DISP 70% ISPRP (MISCELLANEOUS) ×1
APPLIER CLIP 5 13 M/L LIGAMAX5 (MISCELLANEOUS)
APPLIER CLIP ROT 10 11.4 M/L (STAPLE)
APR CLP MED LRG 11.4X10 (STAPLE)
APR CLP MED LRG 5 ANG JAW (MISCELLANEOUS)
BAG SPEC RTRVL LRG 6X4 10 (ENDOMECHANICALS) ×1
CABLE HIGH FREQUENCY MONO STRZ (ELECTRODE) IMPLANT
CHLORAPREP W/TINT 26 (MISCELLANEOUS) ×2 IMPLANT
CLIP APPLIE 5 13 M/L LIGAMAX5 (MISCELLANEOUS) IMPLANT
CLIP APPLIE ROT 10 11.4 M/L (STAPLE) IMPLANT
COVER SURGICAL LIGHT HANDLE (MISCELLANEOUS) ×2 IMPLANT
COVER WAND RF STERILE (DRAPES) IMPLANT
CUTTER FLEX LINEAR 45M (STAPLE) ×1 IMPLANT
DECANTER SPIKE VIAL GLASS SM (MISCELLANEOUS) ×2 IMPLANT
DERMABOND ADVANCED (GAUZE/BANDAGES/DRESSINGS) ×1
DERMABOND ADVANCED .7 DNX12 (GAUZE/BANDAGES/DRESSINGS) ×1 IMPLANT
DRAIN CHANNEL 19F RND (DRAIN) IMPLANT
ELECT REM PT RETURN 15FT ADLT (MISCELLANEOUS) ×2 IMPLANT
ENDOLOOP SUT PDS II  0 18 (SUTURE)
ENDOLOOP SUT PDS II 0 18 (SUTURE) IMPLANT
EVACUATOR SILICONE 100CC (DRAIN) IMPLANT
GLOVE SURG ENC MOIS LTX SZ6 (GLOVE) ×2 IMPLANT
GLOVE SURG MICRO LTX SZ6 (GLOVE) ×2 IMPLANT
GLOVE SURG UNDER LTX SZ6.5 (GLOVE) ×2 IMPLANT
GOWN STRL REUS W/ TWL LRG LVL3 (GOWN DISPOSABLE) ×1 IMPLANT
GOWN STRL REUS W/TWL LRG LVL3 (GOWN DISPOSABLE) ×2
GOWN STRL REUS W/TWL XL LVL3 (GOWN DISPOSABLE) ×2 IMPLANT
GRASPER SUT TROCAR 14GX15 (MISCELLANEOUS) ×1 IMPLANT
IRRIG SUCT STRYKERFLOW 2 WTIP (MISCELLANEOUS) ×2
IRRIGATION SUCT STRKRFLW 2 WTP (MISCELLANEOUS) ×1 IMPLANT
KIT BASIN OR (CUSTOM PROCEDURE TRAY) ×2 IMPLANT
KIT TURNOVER KIT A (KITS) ×2 IMPLANT
NDL INSUFFLATION 14GA 120MM (NEEDLE) ×1 IMPLANT
NEEDLE INSUFFLATION 14GA 120MM (NEEDLE) ×2 IMPLANT
POUCH SPECIMEN RETRIEVAL 10MM (ENDOMECHANICALS) ×2 IMPLANT
RELOAD 45 VASCULAR/THIN (ENDOMECHANICALS) ×2 IMPLANT
RELOAD STAPLE 45 2.5 WHT GRN (ENDOMECHANICALS) IMPLANT
RELOAD STAPLE 45 3.5 BLU ETS (ENDOMECHANICALS) IMPLANT
RELOAD STAPLE TA45 3.5 REG BLU (ENDOMECHANICALS) IMPLANT
SCISSORS LAP 5X35 DISP (ENDOMECHANICALS) IMPLANT
SET TUBE SMOKE EVAC HIGH FLOW (TUBING) ×2 IMPLANT
SHEARS HARMONIC ACE PLUS 36CM (ENDOMECHANICALS) ×2 IMPLANT
SLEEVE XCEL OPT CAN 5 100 (ENDOMECHANICALS) ×2 IMPLANT
SUT ETHILON 2 0 PS N (SUTURE) IMPLANT
SUT MNCRL AB 4-0 PS2 18 (SUTURE) ×2 IMPLANT
TOWEL OR 17X26 10 PK STRL BLUE (TOWEL DISPOSABLE) ×2 IMPLANT
TOWEL OR NON WOVEN STRL DISP B (DISPOSABLE) ×2 IMPLANT
TRAY FOLEY MTR SLVR 14FR STAT (SET/KITS/TRAYS/PACK) ×1 IMPLANT
TRAY FOLEY MTR SLVR 16FR STAT (SET/KITS/TRAYS/PACK) IMPLANT
TRAY LAPAROSCOPIC (CUSTOM PROCEDURE TRAY) ×2 IMPLANT
TROCAR BLADELESS OPT 5 100 (ENDOMECHANICALS) ×2 IMPLANT
TROCAR XCEL 12X100 BLDLESS (ENDOMECHANICALS) ×2 IMPLANT

## 2021-01-29 NOTE — Op Note (Signed)
Operative Report  Kathleen Peters 70 y.o. female  [id number]  [id number]  01/29/2021  Surgeon: Romana Juniper MD  Assistant: none  Procedure performed: Laparoscopic Appendectomy  Preop diagnosis: Acute appendicitis  Post-op diagnosis/intraop findings: Acute appendicitis - with gangrene  Specimens: appendix  EBL: minimal  Complications: none  Description of procedure: After obtaining informed consent the patient was brought to the operating room. Antibiotics and were administered. SCD's were applied. General endotracheal anesthesia was initiated and a formal time-out was performed.  Foley catheter was inserted which is removed at the end of the case.  The abdomen was prepped and draped in the usual sterile fashion and the abdomen was entered using an infraumbilical Veress needle and insufflated to 15 mmHg. A 5 mm trocar and camera were then introduced, the abdomen was inspected and there is no evidence of injury from our entry. A suprapubic 5 mm trocar and a left lower quadrant 12 mm trocar were introduced under direct visualization following infiltration with local. The patient was then placed in Trendelenburg and rotated to the left and the small bowel was reflected cephalad. The appendix was visualized: It appears acutely inflamed with gangrenous changes along the distal half,  no free fluid or purulence was present. The appendix was retrocecal. A combination of blunt dissection and harmonic scalpel were used to free it of its retroperitoneal attachments. Great care was taken to ensure no injury to surrounding retroperitoneal structures, cecum or terminal ileum. A window was created at the base of the appendix and a white load linear cutting stapler was used to transect the appendix from the cecum.  The staple line is very close to but does not impinge the ileocecal valve.  The harmonic scalpel was then used to transect the appendiceal mesentery. Hemostasis was ensured. The appendix was  placed in an Endo Catch bag and removed through our 12 mm trocar site.  The right lower quadrant was again inspected and hemostasis confirmed, the staple line appears viable, intact.  The small bowel was followed proximally from the ileocecal valve several feet and there are no diverticuli or other abnormalities noted but there are adhesions of small bowel in the pelvis from her previous hysterectomy which do not appear obstructive and are left in situ.  The omentum was brought down over the right lower quadrant. The 37mm trocar site in the left lower quadrant was closed with a 0 vicryl in the fascia under direct visualization using a PMI device. The abdomen was desufflated and all trocars removed. The skin incisions were closed with subcuticular 4-0 monocryl and Dermabond. The patient was awakened, extubated and transported to the recovery room in stable condition.   All counts were correct at the completion of the case.

## 2021-01-29 NOTE — Transfer of Care (Signed)
Immediate Anesthesia Transfer of Care Note  Patient: Kathleen Peters  Procedure(s) Performed: APPENDECTOMY LAPAROSCOPIC (N/A )  Patient Location: PACU  Anesthesia Type:General  Level of Consciousness: awake, alert  and patient cooperative  Airway & Oxygen Therapy: Patient Spontanous Breathing and Patient connected to face mask oxygen  Post-op Assessment: Report given to RN and Post -op Vital signs reviewed and stable  Post vital signs: Reviewed and stable  Last Vitals:  Vitals Value Taken Time  BP 108/58 01/29/21 1100  Temp    Pulse    Resp 17 01/29/21 1101  SpO2    Vitals shown include unvalidated device data.  Last Pain:  Vitals:   01/29/21 0928  TempSrc: Oral  PainSc:       Patients Stated Pain Goal: 2 (55/83/16 7425)  Complications: No complications documented.

## 2021-01-29 NOTE — Plan of Care (Signed)
  Problem: Education: Goal: Knowledge of General Education information will improve Description: Including pain rating scale, medication(s)/side effects and non-pharmacologic comfort measures Outcome: Completed/Met   Problem: Health Behavior/Discharge Planning: Goal: Ability to manage health-related needs will improve Outcome: Completed/Met   Problem: Clinical Measurements: Goal: Ability to maintain clinical measurements within normal limits will improve Outcome: Completed/Met Goal: Will remain free from infection Outcome: Completed/Met Goal: Diagnostic test results will improve Outcome: Completed/Met Goal: Respiratory complications will improve Outcome: Completed/Met Goal: Cardiovascular complication will be avoided Outcome: Completed/Met   Problem: Activity: Goal: Risk for activity intolerance will decrease Outcome: Completed/Met   Problem: Coping: Goal: Level of anxiety will decrease Outcome: Completed/Met   Problem: Pain Managment: Goal: General experience of comfort will improve Outcome: Completed/Met   Problem: Skin Integrity: Goal: Risk for impaired skin integrity will decrease Outcome: Completed/Met

## 2021-01-29 NOTE — Anesthesia Preprocedure Evaluation (Addendum)
Anesthesia Evaluation  Patient identified by MRN, date of birth, ID band Patient awake    Reviewed: Allergy & Precautions, NPO status , Patient's Chart, lab work & pertinent test results  History of Anesthesia Complications Negative for: history of anesthetic complications  Airway Mallampati: II  TM Distance: >3 FB Neck ROM: Full    Dental  (+) Dental Advisory Given, Teeth Intact   Pulmonary neg pulmonary ROS,    Pulmonary exam normal        Cardiovascular hypertension, Pt. on home beta blockers Normal cardiovascular exam     Neuro/Psych negative neurological ROS     GI/Hepatic Neg liver ROS,   Endo/Other  negative endocrine ROSGrave's disease  Renal/GU negative Renal ROS     Musculoskeletal negative musculoskeletal ROS (+)   Abdominal   Peds  Hematology negative hematology ROS (+)   Anesthesia Other Findings   Reproductive/Obstetrics                            Anesthesia Physical Anesthesia Plan  ASA: II  Anesthesia Plan: General   Post-op Pain Management:    Induction: Intravenous, Rapid sequence and Cricoid pressure planned  PONV Risk Score and Plan: 4 or greater and Ondansetron, Dexamethasone, Treatment may vary due to age or medical condition and Scopolamine patch - Pre-op  Airway Management Planned: Oral ETT  Additional Equipment:   Intra-op Plan:   Post-operative Plan: Extubation in OR  Informed Consent: I have reviewed the patients History and Physical, chart, labs and discussed the procedure including the risks, benefits and alternatives for the proposed anesthesia with the patient or authorized representative who has indicated his/her understanding and acceptance.     Dental advisory given  Plan Discussed with: Anesthesiologist and CRNA  Anesthesia Plan Comments:        Anesthesia Quick Evaluation

## 2021-01-29 NOTE — Anesthesia Postprocedure Evaluation (Signed)
Anesthesia Post Note  Patient: Kathleen Peters  Procedure(s) Performed: APPENDECTOMY LAPAROSCOPIC (N/A )     Patient location during evaluation: PACU Anesthesia Type: General Level of consciousness: sedated Pain management: pain level controlled Vital Signs Assessment: post-procedure vital signs reviewed and stable Respiratory status: spontaneous breathing and respiratory function stable Cardiovascular status: stable Postop Assessment: no apparent nausea or vomiting Anesthetic complications: no   No complications documented.  Last Vitals:  Vitals:   01/29/21 1350 01/29/21 1457  BP: (!) 95/55 (!) 97/56  Pulse: 72 68  Resp: 18 18  Temp: 36.8 C 37.4 C  SpO2: 98% 97%    Last Pain:  Vitals:   01/29/21 1457  TempSrc: Oral  PainSc:                  Nygel Prokop DANIEL

## 2021-01-29 NOTE — Discharge Instructions (Signed)
[facility]  Please arrive at least 30 min before your appointment to complete your check in paperwork.  If you are unable to arrive 30 min prior to your appointment time we may have to cancel or reschedule you. LAPAROSCOPIC SURGERY: POST OP INSTRUCTIONS Always review your discharge instruction sheet given to you by the facility where your surgery was performed. IF YOU HAVE DISABILITY OR FAMILY LEAVE FORMS, YOU MUST BRING THEM TO THE OFFICE FOR PROCESSING.   DO NOT GIVE THEM TO YOUR DOCTOR.  PAIN CONTROL  1. First take acetaminophen (Tylenol) AND/or ibuprofen (Advil) to control your pain after surgery.  Follow directions on package.  Taking acetaminophen (Tylenol) and/or ibuprofen (Advil) regularly after surgery will help to control your pain and lower the amount of prescription pain medication you may need.  You should not take more than 4,000 mg (4 grams) of acetaminophen (Tylenol) in 24 hours.  You should not take ibuprofen (Advil), aleve, motrin, naprosyn or other NSAIDS if you have a history of stomach ulcers or chronic kidney disease.  2. A prescription for pain medication may be given to you upon discharge.  Take your pain medication as prescribed, if you still have uncontrolled pain after taking acetaminophen (Tylenol) or ibuprofen (Advil). 3. Use ice packs to help control pain. 4. If you need a refill on your pain medication, please contact your pharmacy.  They will contact our office to request authorization. Prescriptions will not be filled after 5pm or on week-ends.  HOME MEDICATIONS 5. Take your usually prescribed medications unless otherwise directed.  DIET 6. You should follow a light diet the first few days after arrival home.  Be sure to include lots of fluids daily. Avoid fatty, fried foods.   CONSTIPATION 7. It is common to experience some constipation after surgery and if you are taking pain medication.  Increasing fluid intake and taking a stool  softener (such as Colace) will usually help or prevent this problem from occurring.  A mild laxative (Milk of Magnesia or Miralax) should be taken according to package instructions if there are no bowel movements after 48 hours.  WOUND/INCISION CARE 8. Most patients will experience some swelling and bruising in the area of the incisions.  Ice packs will help.  Swelling and bruising can take several days to resolve.  9. Unless discharge instructions indicate otherwise, follow guidelines below  a. STERI-STRIPS - you may remove your outer bandages 48 hours after surgery, and you may shower at that time.  You have steri-strips (small skin tapes) in place directly over the incision.  These strips should be left on the skin for 7-10 days.   b. DERMABOND/SKIN GLUE - you may shower in 24 hours.  The glue will flake off over the next 2-3 weeks. 10. Any sutures or staples will be removed at the office during your follow-up visit.  ACTIVITIES 11. You may resume regular (light) daily activities beginning the next day--such as daily self-care, walking, climbing stairs--gradually increasing activities as tolerated.  You may have sexual intercourse when it is comfortable.  Refrain from any heavy lifting or straining until approved by your doctor. a. You may drive when you are no longer taking prescription pain medication, you can comfortably wear a seatbelt, and you can safely maneuver your car and apply brakes.  FOLLOW-UP 12. You should see your doctor in the office for a follow-up appointment approximately 2-3 weeks after your surgery.  You should have been given your post-op/follow-up appointment when   your surgery was scheduled.  If you did not receive a post-op/follow-up appointment, make sure that you call for this appointment within a day or two after you arrive home to insure a convenient appointment time.   WHEN TO CALL YOUR DOCTOR: 1. Fever over 101.0 2. Inability to urinate 3. Continued bleeding from  incision. 4. Increased pain, redness, or drainage from the incision. 5. Increasing abdominal pain  The clinic staff is available to answer your questions during regular business hours.  Please don't hesitate to call and ask to speak to one of the nurses for clinical concerns.  If you have a medical emergency, go to the nearest emergency room or call 911.  A surgeon from [facility] is always on call at the hospital. [address], [address], [place], [place]  [address] ? [address] [address], [place],    [address] [phone] ? [phone] ? FAX [phone]  .........   Managing Your Pain After Surgery Without Opioids    Thank you for participating in our program to help patients manage their pain after surgery without opioids. This is part of our effort to provide you with the best care possible, without exposing you or your family to the risk that opioids pose.  What pain can I expect after surgery? You can expect to have some pain after surgery. This is normal. The pain is typically worse the day after surgery, and quickly begins to get better. Many studies have found that many patients are able to manage their pain after surgery with Over-the-Counter (OTC) medications such as Tylenol and Motrin. If you have a condition that does not allow you to take Tylenol or Motrin, notify your surgical team.  How will I manage my pain? The best strategy for controlling your pain after surgery is around the clock pain control with Tylenol (acetaminophen) and Motrin (ibuprofen or Advil). Alternating these medications with each other allows you to maximize your pain control. In addition to Tylenol and Motrin, you can use heating pads or ice packs on your incisions to help reduce your pain.  How will I alternate your regular strength over-the-counter pain medication? You will take a dose of pain medication every three hours. ; Start by taking 650 mg of Tylenol (2 pills of 325  mg) ; 3 hours later take 600 mg of Motrin (3 pills of 200 mg) ; 3 hours after taking the Motrin take 650 mg of Tylenol ; 3 hours after that take 600 mg of Motrin.   - 1 -  See example - if your first dose of Tylenol is at 12:00 PM   12:00 PM Tylenol 650 mg (2 pills of 325 mg)  3:00 PM Motrin 600 mg (3 pills of 200 mg)  6:00 PM Tylenol 650 mg (2 pills of 325 mg)  9:00 PM Motrin 600 mg (3 pills of 200 mg)  Continue alternating every 3 hours   We recommend that you follow this schedule around-the-clock for at least 3 days after surgery, or until you feel that it is no longer needed. Use the table on the last page of this handout to keep track of the medications you are taking. Important: Do not take more than 3000mg of Tylenol or 3200mg of Motrin in a 24-hour period. Do not take ibuprofen/Motrin if you have a history of bleeding stomach ulcers, severe kidney disease, &/or actively taking a blood thinner  What if I still have pain? If you have pain that is not   controlled with the over-the-counter pain medications (Tylenol and Motrin or Advil) you might have what we call "breakthrough" pain. You will receive a prescription for a small amount of an opioid pain medication such as Oxycodone, Tramadol, or Tylenol with Codeine. Use these opioid pills in the first 24 hours after surgery if you have breakthrough pain. Do not take more than 1 pill every 4-6 hours.  If you still have uncontrolled pain after using all opioid pills, don't hesitate to call our staff using the number provided. We will help make sure you are managing your pain in the best way possible, and if necessary, we can provide a prescription for additional pain medication.   Day 1    Time  Name of Medication Number of pills taken  Amount of Acetaminophen  Pain Level   Comments  AM PM       AM PM       AM PM       AM PM       AM PM       AM PM       AM PM       AM PM       Total Daily amount of Acetaminophen Do not  take more than  3,000 mg per day      Day 2    Time  Name of Medication Number of pills taken  Amount of Acetaminophen  Pain Level   Comments  AM PM       AM PM       AM PM       AM PM       AM PM       AM PM       AM PM       AM PM       Total Daily amount of Acetaminophen Do not take more than  3,000 mg per day      Day 3    Time  Name of Medication Number of pills taken  Amount of Acetaminophen  Pain Level   Comments  AM PM       AM PM       AM PM       AM PM          AM PM       AM PM       AM PM       AM PM       Total Daily amount of Acetaminophen Do not take more than  3,000 mg per day      Day 4    Time  Name of Medication Number of pills taken  Amount of Acetaminophen  Pain Level   Comments  AM PM       AM PM       AM PM       AM PM       AM PM       AM PM       AM PM       AM PM       Total Daily amount of Acetaminophen Do not take more than  3,000 mg per day      Day 5    Time  Name of Medication Number of pills taken  Amount of Acetaminophen  Pain Level   Comments  AM PM       AM PM       AM   PM       AM PM       AM PM       AM PM       AM PM       AM PM       Total Daily amount of Acetaminophen Do not take more than  3,000 mg per day       Day 6    Time  Name of Medication Number of pills taken  Amount of Acetaminophen  Pain Level  Comments  AM PM       AM PM       AM PM       AM PM       AM PM       AM PM       AM PM       AM PM       Total Daily amount of Acetaminophen Do not take more than  3,000 mg per day      Day 7    Time  Name of Medication Number of pills taken  Amount of Acetaminophen  Pain Level   Comments  AM PM       AM PM       AM PM       AM PM       AM PM       AM PM       AM PM       AM PM       Total Daily amount of Acetaminophen Do not take more than  3,000 mg per day        For additional information about how and where to safely dispose of unused  opioid medications - https://www.morepowerfulnc.org  Disclaimer: This document contains information and/or instructional materials adapted from [facility] for the typical patient with your condition. It does not replace medical advice from your health care provider because your experience may differ from that of the typical patient. Talk to your health care provider if you have any questions about this document, your condition or your treatment plan. Adapted from [facility]   

## 2021-01-29 NOTE — Discharge Summary (Signed)
Patient ID: Kathleen Peters [id number] 10-Mar-1951 70 y.o.  Admit date: 01/28/2021 Discharge date: 01/29/2021  Admitting Diagnosis: Acute appendicitis  Discharge Diagnosis Patient Active Problem List   Diagnosis Date Noted  . Acute appendicitis 01/28/2021  . Hyperthyroidism 04/14/2020  . Heterozygous factor V Leiden mutation ([place]) 09/23/2015  . Lupus anticoagulant positive 09/23/2015  . Varicose veins of both lower extremities 09/23/2015  . Thrombophlebitis of superficial veins of right lower extremity 04/24/2014  s/p lap appy  Consultants none  Reason for Admission: Pt is a 70 yo F who presented to the [facility] ED with abdominal pain.  This began around lunchtime yesterday and was diffuse vague abdominal pain.  This quickly localized and intensified in the right lower quadrant which caused her to present to the [facility].  She notes that this radiates to her back.  Denies nausea.  The pain is worse this morning than it was yesterday.  Previous abdominal surgery includes laparoscopic hysterectomy.  She endorses a history of Graves' disease, notes that her clotting factors have never been a significant problem.  She is not on any anticoagulation.   Procedures Lap appy, Dr. Kae Heller [facility] Course:  The patient was admitted and underwent a laparoscopic appendectomy.  The patient tolerated the procedure well.  On POD 0 the patient was tolerating a diet, voiding well, mobilizing, and pain was controlled with oral pain medications.  The patient was stable for DC home at this time with appropriate follow up made.   Physical Exam: Gen: Alert, NAD Pulm: rate and effort normal Abd: soft, minimal distension, appropriately tender, lap incisions cdi without erythema or drainage  Allergies as of 01/29/2021   No Known Allergies     Medication List    TAKE these medications   atenolol 25 MG tablet Commonly known as: TENORMIN Take 25 mg by mouth every other day.    eletriptan 40 MG tablet Commonly known as: RELPAX Take 40 mg by mouth as needed for migraine.   estradiol 1 MG tablet Commonly known as: ESTRACE Take 1 mg by mouth daily.   Fish Oil 1200 MG Caps Take 1 capsule by mouth daily.   folic acid 1 MG tablet Commonly known as: FOLVITE Take 1 mg by mouth daily.   LORazepam 1 MG tablet Commonly known as: ATIVAN Take 1 mg by mouth at bedtime as needed.   methimazole 10 MG tablet Commonly known as: TAPAZOLE Take 1 tablet (10 mg total) by mouth daily. What changed: how much to take   methotrexate 2.5 MG tablet Take 5 mg by mouth once a week. thursdays   Multi-Vitamins Tabs Take 1 tablet by mouth daily.   polyethylene glycol 17 g packet Commonly known as: MiraLax Take 17 g by mouth daily as needed for mild constipation.   traMADol 50 MG tablet Commonly known as: ULTRAM Take 50 mg by mouth as needed for moderate pain.   Vitamin D3 50 MCG (2000 UT) capsule Take 2,000 Units by mouth daily.   zolpidem 10 MG tablet Commonly known as: [place]    [facility], [place]. Go on 02/18/2021.   Specialty: General Surgery Why: Your appointment is 02/18/21 at 11:15 AM  Arrive 30 minutes prior to your appointment time for paperwork and check in process Contact information: [address] [place] [address] [phone]  Signed: Margie Billet, [facility] 01/29/2021, 3:52 PM Please see Amion for pager number during day hours 7:00am-4:30pm, 7-11:30am on Weekends

## 2021-01-29 NOTE — Anesthesia Procedure Notes (Signed)
Procedure Name: Intubation Date/Time: 01/29/2021 9:46 AM Performed by: West Pugh, CRNA Pre-anesthesia Checklist: Patient identified, Emergency Drugs available, Suction available, Patient being monitored and Timeout performed Patient Re-evaluated:Patient Re-evaluated prior to induction Oxygen Delivery Method: Circle system utilized Preoxygenation: Pre-oxygenation with 100% oxygen Induction Type: IV induction, Rapid sequence and Cricoid Pressure applied Laryngoscope Size: Mac and 3 Grade View: Grade II Tube type: Oral Tube size: 7.0 mm Number of attempts: 1 Airway Equipment and Method: Stylet Placement Confirmation: ETT inserted through vocal cords under direct vision,  positive ETCO2,  CO2 detector and breath sounds checked- equal and bilateral Secured at: 21 cm Tube secured with: Tape Dental Injury: Teeth and Oropharynx as per pre-operative assessment

## 2021-01-30 ENCOUNTER — Encounter (HOSPITAL_COMMUNITY): Payer: Self-pay | Admitting: Surgery

## 2021-01-30 LAB — SURGICAL PATHOLOGY

## 2021-02-03 DIAGNOSIS — Z85828 Personal history of other malignant neoplasm of skin: Secondary | ICD-10-CM | POA: Diagnosis not present

## 2021-02-03 DIAGNOSIS — Z79899 Other long term (current) drug therapy: Secondary | ICD-10-CM | POA: Diagnosis not present

## 2021-02-03 DIAGNOSIS — L400 Psoriasis vulgaris: Secondary | ICD-10-CM | POA: Diagnosis not present

## 2021-02-03 DIAGNOSIS — L603 Nail dystrophy: Secondary | ICD-10-CM | POA: Diagnosis not present

## 2021-02-11 DIAGNOSIS — Z961 Presence of intraocular lens: Secondary | ICD-10-CM | POA: Diagnosis not present

## 2021-02-11 DIAGNOSIS — E0500 Thyrotoxicosis with diffuse goiter without thyrotoxic crisis or storm: Secondary | ICD-10-CM | POA: Diagnosis not present

## 2021-02-12 ENCOUNTER — Other Ambulatory Visit: Payer: Self-pay

## 2021-02-12 ENCOUNTER — Encounter: Payer: Self-pay | Admitting: Orthopaedic Surgery

## 2021-02-12 ENCOUNTER — Ambulatory Visit: Payer: Medicare HMO | Admitting: Orthopaedic Surgery

## 2021-02-12 DIAGNOSIS — M25461 Effusion, right knee: Secondary | ICD-10-CM | POA: Diagnosis not present

## 2021-02-12 DIAGNOSIS — M25561 Pain in right knee: Secondary | ICD-10-CM

## 2021-02-12 DIAGNOSIS — G8929 Other chronic pain: Secondary | ICD-10-CM

## 2021-02-12 NOTE — Progress Notes (Signed)
Ct knee

## 2021-02-12 NOTE — Progress Notes (Signed)
The patient is a very pleasant 70 year old female that I saw back in April for acute pain to her right knee with swelling.  This was suspicious for a meniscal tear.  She cannot have a MRI due to a cochlear implant.  I did aspirate fluid from her knee and placed a steroid injection in her right knee.  She says that helped great for about 6 weeks and now she is having right knee pain again with some locking swelling and pain in the back of her knee.  Examination of her right knee does show some slight warmth.  There is no redness.  She has good range of motion of the knee and there is fullness in the popliteal area and lateral joint line tenderness.  She does have slight valgus malalignment.  The previous plain film showed well-maintained medial lateral compartments.  I did aspirate another 25 cc of fluid off of her knee that was clear.  At this point I would like to obtain a CT scan of her right knee to better assess the cartilage and potentially assess whether or not there is a meniscal tear.  She understands that this is not as specific or sensitive a study for the knee as a MRI but she cannot have an MRI.  She did tolerate the aspiration of fluid from her right knee well and felt better.  We will see her back in follow-up after having CT scan of her right knee and then can make a better recommendation then in terms of treatment plan.  Of note she recently had an appendectomy emergently just 2 weeks ago.

## 2021-02-28 ENCOUNTER — Telehealth: Payer: Self-pay | Admitting: Orthopaedic Surgery

## 2021-02-28 ENCOUNTER — Telehealth: Payer: Self-pay

## 2021-02-28 NOTE — Telephone Encounter (Signed)
Patient called stating she is in so much pain she can barely walk States her CT isn't until the 12th but wondering if she can have an injection yet She said you suggested possibly surgery if CT results suggest, but I told her you may not want to do an injection if you potentially need surgery I told her to use Aleve

## 2021-02-28 NOTE — Telephone Encounter (Signed)
Patient called she stated she missed a call from autumn I advised patient of previous message, I made a appointment for the patient to come in Monday 7/11 at 3:40 with Dr.Hilts call back:[phone]

## 2021-02-28 NOTE — Telephone Encounter (Signed)
Lvm for pt to cb to discuss inj with dr. Junius Roads

## 2021-02-28 NOTE — Telephone Encounter (Signed)
noted 

## 2021-02-28 NOTE — Telephone Encounter (Signed)
See other message

## 2021-03-03 ENCOUNTER — Ambulatory Visit: Payer: Self-pay

## 2021-03-03 ENCOUNTER — Ambulatory Visit: Payer: Medicare HMO | Admitting: Family Medicine

## 2021-03-03 ENCOUNTER — Other Ambulatory Visit: Payer: Self-pay

## 2021-03-03 ENCOUNTER — Encounter: Payer: Self-pay | Admitting: Family Medicine

## 2021-03-03 ENCOUNTER — Ambulatory Visit (INDEPENDENT_AMBULATORY_CARE_PROVIDER_SITE_OTHER): Payer: Medicare HMO | Admitting: Family Medicine

## 2021-03-03 DIAGNOSIS — G8929 Other chronic pain: Secondary | ICD-10-CM

## 2021-03-03 DIAGNOSIS — M25461 Effusion, right knee: Secondary | ICD-10-CM

## 2021-03-03 DIAGNOSIS — M25561 Pain in right knee: Secondary | ICD-10-CM | POA: Diagnosis not present

## 2021-03-03 DIAGNOSIS — L409 Psoriasis, unspecified: Secondary | ICD-10-CM | POA: Diagnosis not present

## 2021-03-03 DIAGNOSIS — Z79899 Other long term (current) drug therapy: Secondary | ICD-10-CM | POA: Diagnosis not present

## 2021-03-03 DIAGNOSIS — L400 Psoriasis vulgaris: Secondary | ICD-10-CM | POA: Diagnosis not present

## 2021-03-03 NOTE — Progress Notes (Signed)
Office Visit Note   Patient: Kathleen Peters           Date of Birth: 1951/07/01           MRN: [id number] Visit Date: 03/03/2021 Requested by: Antony Contras, MD [place],  [place] [address] PCP: Antony Contras, MD  Subjective: Chief Complaint  Patient presents with   Right Knee - Pain    Evaluation with ultrasound and possible repeat steroid injection, per Dr. Ninfa Linden    HPI: She is here with right knee pain.  Last week she called Dr. Ninfa Linden in quite a bit of pain.  She is scheduled for a CT scan of her knee but was wondering about getting an injection due to the intensity of her pain.  She had 1 previously which helped for about 6 weeks.  She cannot have MRI scan due to a cochlear implant.  Today she is feeling better than she did when she called.                ROS:   All other systems were reviewed and are negative.  Objective: Vital Signs: There were no vitals taken for this visit.  Physical Exam:  General:  Alert and oriented, in no acute distress. Pulm:  Breathing unlabored. Psy:  Normal mood, congruent affect. Skin: No erythema Right leg: 1+ effusion with no warmth.  She is tender primarily on the lateral joint line and has pain with a palpable click on McMurray's.  Ligaments feel stable.  No tenderness over the quadriceps or patellar tendons.    Imaging: US Guided Needle Placement - No Linked Charges  Result Date: 03/03/2021 Limited diagnostic ultrasound of the right knee reveals hypoechoic clefts in the medial and lateral menisci.  There is a mild to moderate sized joint effusion with no hyperemia on power Doppler imaging.  Quadriceps and patellar tendons look intact.   Assessment & Plan: Recurrent right knee pain with ultrasound exam suspicious for medial and lateral meniscus tears -Discussed with her and she wants to try another injection today.  She will then proceed with CT scan when her knee starts bothering her again, and follow-up  with Dr. Ninfa Linden afterward to discuss whether arthroscopic intervention is warranted.     Procedures: Right knee steroid injection: After sterile prep with Betadine, injected 3 cc 1% lidocaine without epinephrine and 6 mg betamethasone from superolateral approach, a flash of clear yellow synovial fluid was obtained prior to injection.       PMFS History: Patient Active Problem List   Diagnosis Date Noted   Acute appendicitis 01/28/2021   Hyperthyroidism 04/14/2020   Heterozygous factor V Leiden mutation ([place]) 09/23/2015   Lupus anticoagulant positive 09/23/2015   Varicose veins of both lower extremities 09/23/2015   Thrombophlebitis of superficial veins of right lower extremity 04/24/2014   Past Medical History:  Diagnosis Date   Back pain    Graves disease    Insomnia    Migraines     Family History  Problem Relation Age of Onset   Thyroid cancer Sister     Past Surgical History:  Procedure Laterality Date   ABDOMINAL HYSTERECTOMY     ELBOW FRACTURE SURGERY     LAPAROSCOPIC APPENDECTOMY N/A 01/29/2021   Procedure: APPENDECTOMY LAPAROSCOPIC;  Surgeon: Clovis Riley, MD;  Location: [facility];  Service: General;  Laterality: N/A;   TONSILLECTOMY     TYMPANOSTOMY TUBE PLACEMENT     Social History   Occupational  History   Not on file  Tobacco Use   Smoking status: Never   Smokeless tobacco: Never  Substance and Sexual Activity   Alcohol use: Yes    Comment: soc   Drug use: Not Currently   Sexual activity: Not on file

## 2021-03-06 ENCOUNTER — Other Ambulatory Visit: Payer: Medicare HMO

## 2021-03-10 ENCOUNTER — Ambulatory Visit: Payer: Medicare HMO | Admitting: Orthopaedic Surgery

## 2021-03-11 ENCOUNTER — Other Ambulatory Visit: Payer: Medicare HMO

## 2021-03-14 DIAGNOSIS — M79642 Pain in left hand: Secondary | ICD-10-CM | POA: Diagnosis not present

## 2021-03-14 DIAGNOSIS — M6620 Spontaneous rupture of extensor tendons, unspecified site: Secondary | ICD-10-CM | POA: Diagnosis not present

## 2021-03-14 DIAGNOSIS — M65332 Trigger finger, left middle finger: Secondary | ICD-10-CM | POA: Diagnosis not present

## 2021-03-14 DIAGNOSIS — L608 Other nail disorders: Secondary | ICD-10-CM | POA: Diagnosis not present

## 2021-03-17 DIAGNOSIS — G43909 Migraine, unspecified, not intractable, without status migrainosus: Secondary | ICD-10-CM | POA: Diagnosis not present

## 2021-03-17 DIAGNOSIS — G4700 Insomnia, unspecified: Secondary | ICD-10-CM | POA: Diagnosis not present

## 2021-03-17 DIAGNOSIS — E611 Iron deficiency: Secondary | ICD-10-CM | POA: Diagnosis not present

## 2021-03-17 DIAGNOSIS — L409 Psoriasis, unspecified: Secondary | ICD-10-CM | POA: Diagnosis not present

## 2021-03-17 DIAGNOSIS — Z0000 Encounter for general adult medical examination without abnormal findings: Secondary | ICD-10-CM | POA: Diagnosis not present

## 2021-03-17 DIAGNOSIS — E7800 Pure hypercholesterolemia, unspecified: Secondary | ICD-10-CM | POA: Diagnosis not present

## 2021-03-17 DIAGNOSIS — E1169 Type 2 diabetes mellitus with other specified complication: Secondary | ICD-10-CM | POA: Diagnosis not present

## 2021-03-17 DIAGNOSIS — Z1389 Encounter for screening for other disorder: Secondary | ICD-10-CM | POA: Diagnosis not present

## 2021-03-17 DIAGNOSIS — M48061 Spinal stenosis, lumbar region without neurogenic claudication: Secondary | ICD-10-CM | POA: Diagnosis not present

## 2021-03-23 ENCOUNTER — Encounter: Payer: Self-pay | Admitting: Orthopaedic Surgery

## 2021-03-24 NOTE — Telephone Encounter (Signed)
I contacted Kathleen Peters with imaging to see if can help me.

## 2021-03-25 ENCOUNTER — Ambulatory Visit
Admission: RE | Admit: 2021-03-25 | Discharge: 2021-03-25 | Disposition: A | Discharge status: Home / Self Care | Payer: Medicare HMO | Source: Ambulatory Visit | Attending: Orthopaedic Surgery | Admitting: Orthopaedic Surgery

## 2021-03-25 DIAGNOSIS — M25461 Effusion, right knee: Secondary | ICD-10-CM | POA: Diagnosis not present

## 2021-03-25 DIAGNOSIS — G8929 Other chronic pain: Secondary | ICD-10-CM

## 2021-03-25 DIAGNOSIS — M1711 Unilateral primary osteoarthritis, right knee: Secondary | ICD-10-CM | POA: Diagnosis not present

## 2021-03-25 DIAGNOSIS — M25561 Pain in right knee: Secondary | ICD-10-CM

## 2021-03-25 DIAGNOSIS — M7121 Synovial cyst of popliteal space [Baker], right knee: Secondary | ICD-10-CM | POA: Diagnosis not present

## 2021-04-01 ENCOUNTER — Other Ambulatory Visit: Payer: Medicare HMO

## 2021-04-01 ENCOUNTER — Encounter: Payer: Self-pay | Admitting: Orthopaedic Surgery

## 2021-04-01 ENCOUNTER — Ambulatory Visit (INDEPENDENT_AMBULATORY_CARE_PROVIDER_SITE_OTHER): Payer: Medicare HMO | Admitting: Orthopaedic Surgery

## 2021-04-01 DIAGNOSIS — M25561 Pain in right knee: Secondary | ICD-10-CM

## 2021-04-01 DIAGNOSIS — M1712 Unilateral primary osteoarthritis, left knee: Secondary | ICD-10-CM | POA: Diagnosis not present

## 2021-04-01 DIAGNOSIS — M25461 Effusion, right knee: Secondary | ICD-10-CM

## 2021-04-01 DIAGNOSIS — G8929 Other chronic pain: Secondary | ICD-10-CM

## 2021-04-01 NOTE — Progress Notes (Signed)
The patient comes in today for continued evaluation and treatment of a painful right knee with recurrent effusions.  She does have valgus malalignment of that knee.  She is 70 years old and very active.  We had to obtain a CT scan of the right knee because she has a cochlear implant and cannot have an MRI.  On examination of her right knee today there is still valgus malalignment and a moderate effusion.  There is pain throughout the arc of motion of the knee especially posterior lateral.  The CT scan is reviewed and it does show significant tricompartment arthritis of the right knee.  There is cystic changes in the lateral compartment of that knee in the subchondral area as well as joint space narrowing and particular osteophytes.  There is a moderate joint effusion and a large Baker's cyst.  There is osteophytes in the medial and patellofemoral joint as well.  I went over the CT scan with her in detail.  She has pain on a daily basis with her right knee and is detrimentally affecting her mobility, her quality of life and actives daily living.  She has had multiple steroid injections and aspirations and this is failed to provide relief for her right knee.  It hurts with activities and with not activities.  If she has been sitting for a while and gets up in the knee is very stiff and painful.  She has tried and failed all forms of conservative treatment at this point we have recommended knee replacement surgery.  Since she has failed conservative treatment including even therapy with quad strengthening for over 12 months now and in light of her continued recurrent effusions and CT scan as well as clinical exam findings, we are recommending a knee replacement.  I did show her knee replacement model and described in detail what the surgery involves.  We discussed the risks and benefits of surgery and what to expect from an intraoperative and postoperative course.  She is interested in having this scheduled and I  agree.  We will be in touch about scheduling surgery.

## 2021-04-03 ENCOUNTER — Ambulatory Visit: Payer: Medicare HMO | Admitting: Orthopaedic Surgery

## 2021-04-04 DIAGNOSIS — E0500 Thyrotoxicosis with diffuse goiter without thyrotoxic crisis or storm: Secondary | ICD-10-CM | POA: Diagnosis not present

## 2021-04-04 DIAGNOSIS — E0590 Thyrotoxicosis, unspecified without thyrotoxic crisis or storm: Secondary | ICD-10-CM | POA: Diagnosis not present

## 2021-04-07 ENCOUNTER — Ambulatory Visit: Payer: Medicare HMO | Admitting: Orthopaedic Surgery

## 2021-04-07 DIAGNOSIS — B078 Other viral warts: Secondary | ICD-10-CM | POA: Diagnosis not present

## 2021-04-07 DIAGNOSIS — L603 Nail dystrophy: Secondary | ICD-10-CM | POA: Diagnosis not present

## 2021-04-07 DIAGNOSIS — Z85828 Personal history of other malignant neoplasm of skin: Secondary | ICD-10-CM | POA: Diagnosis not present

## 2021-04-07 DIAGNOSIS — Z79899 Other long term (current) drug therapy: Secondary | ICD-10-CM | POA: Diagnosis not present

## 2021-04-07 DIAGNOSIS — L400 Psoriasis vulgaris: Secondary | ICD-10-CM | POA: Diagnosis not present

## 2021-04-14 ENCOUNTER — Telehealth: Payer: Self-pay

## 2021-04-14 NOTE — Telephone Encounter (Signed)
Pt called and would like to go ahead and set up surgery.   Please advise.

## 2021-04-14 NOTE — Telephone Encounter (Signed)
I called patient and scheduled surgery. 

## 2021-04-22 ENCOUNTER — Other Ambulatory Visit: Payer: Self-pay

## 2021-05-06 DIAGNOSIS — B078 Other viral warts: Secondary | ICD-10-CM | POA: Diagnosis not present

## 2021-05-06 DIAGNOSIS — D692 Other nonthrombocytopenic purpura: Secondary | ICD-10-CM | POA: Diagnosis not present

## 2021-05-06 DIAGNOSIS — L603 Nail dystrophy: Secondary | ICD-10-CM | POA: Diagnosis not present

## 2021-05-06 DIAGNOSIS — Z85828 Personal history of other malignant neoplasm of skin: Secondary | ICD-10-CM | POA: Diagnosis not present

## 2021-05-12 DIAGNOSIS — E0590 Thyrotoxicosis, unspecified without thyrotoxic crisis or storm: Secondary | ICD-10-CM | POA: Diagnosis not present

## 2021-05-16 ENCOUNTER — Other Ambulatory Visit: Payer: Self-pay | Admitting: Physician Assistant

## 2021-05-16 NOTE — Progress Notes (Signed)
Sent message, via epic in basket, requesting orders in epic from surgeon.  

## 2021-05-20 NOTE — Progress Notes (Addendum)
Anesthesia Review:  PCP: DR Antony Contras  Cardiologist : none  Chest x-ray : EKG : Echo : Stress test: Cardiac Cath :  Activity level: can do a flight of stairs without difficulty  Sleep Study/ CPAP : none  Fasting Blood Sugar :      / Checks Blood Sugar -- times a day:   Blood Thinner/ Instructions /Last Dose: ASA / Instructions/ Last Dose :   PT takes Atenolol for migraines not for blood pressure.   Covid Test 05/27/2021 CBC done 05/22/21 routed to Dr Zollie Beckers.

## 2021-05-21 NOTE — Progress Notes (Signed)
DUE TO COVID-19 ONLY ONE VISITOR IS ALLOWED TO COME WITH YOU AND STAY IN THE WAITING ROOM ONLY DURING PRE OP AND PROCEDURE DAY OF SURGERY.  2 VISITOR  MAY VISIT WITH YOU AFTER SURGERY IN YOUR PRIVATE ROOM DURING VISITING HOURS ONLY!  YOU NEED TO HAVE A COVID 19 TEST ON__10/11/2020 @_  @_from  8am-3pm _____, THIS TEST MUST BE DONE BEFORE SURGERY,  Covid test is done at [place], [place] [address].  This is a drive thru.  No appt required. Please see map.                 Your procedure is scheduled on:  05/30/2021   Report to [facility] Main  Entrance   Report to admitting at    1045 AM     Call this number if you have problems the morning of surgery [phone]    REMEMBER: NO  SOLID FOOD CANDY OR GUM AFTER MIDNIGHT. CLEAR LIQUIDS UNTIL   1030am        . NOTHING BY MOUTH EXCEPT CLEAR LIQUIDS UNTIL    1030am   . PLEASE FINISH ENSURE DRINK PER SURGEON ORDER  WHICH NEEDS TO BE COMPLETED AT 1030am      .      CLEAR LIQUID DIET   Foods Allowed                                                                    Coffee and tea, regular and decaf                            Fruit ices (not with fruit pulp)                                      Iced Popsicles                                    Carbonated beverages, regular and diet                                    Cranberry, grape and apple juices Sports drinks like Gatorade Lightly seasoned clear broth or consume(fat free) Sugar, honey syrup ___________________________________________________________________      BRUSH YOUR TEETH MORNING OF SURGERY AND RINSE YOUR MOUTH OUT, NO CHEWING GUM CANDY OR MINTS.     Take these medicines the morning of surgery with A SIP OF WATER:  atenolol, tapazole if due to take   DO NOT TAKE ANY DIABETIC MEDICATIONS DAY OF YOUR SURGERY                               You may not have any metal on your body including hair pins and              piercings  Do not wear jewelry,  make-up, lotions, powders or perfumes, deodorant  Do not wear nail polish on your fingernails.  Do not shave  48 hours prior to surgery.              Men may shave face and neck.   Do not bring valuables to the hospital. [place].  Contacts, dentures or bridgework may not be worn into surgery.  Leave suitcase in the car. After surgery it may be brought to your room.     Patients discharged the day of surgery will not be allowed to drive home. IF YOU ARE HAVING SURGERY AND GOING HOME THE SAME DAY, YOU MUST HAVE AN ADULT TO DRIVE YOU HOME AND BE WITH YOU FOR 24 HOURS. YOU MAY GO HOME BY TAXI OR UBER OR ORTHERWISE, BUT AN ADULT MUST ACCOMPANY YOU HOME AND STAY WITH YOU FOR 24 HOURS.  Name and phone number of your driver:  Special Instructions: N/A              Please read over the following fact sheets you were given: _____________________________________________________________________  [facility] - Preparing for Surgery Before surgery, you can play an important role.  Because skin is not sterile, your skin needs to be as free of germs as possible.  You can reduce the number of germs on your skin by washing with CHG (chlorahexidine gluconate) soap before surgery.  CHG is an antiseptic cleaner which kills germs and bonds with the skin to continue killing germs even after washing. Please DO NOT use if you have an allergy to CHG or antibacterial soaps.  If your skin becomes reddened/irritated stop using the CHG and inform your nurse when you arrive at Short Stay. Do not shave (including legs and underarms) for at least 48 hours prior to the first CHG shower.  You may shave your face/neck. Please follow these instructions carefully:  1.  Shower with CHG Soap the night before surgery and the  morning of Surgery.  2.  If you choose to wash your hair, wash your hair first as usual with your  normal  shampoo.  3.  After you shampoo, rinse  your hair and body thoroughly to remove the  shampoo.                           4.  Use CHG as you would any other liquid soap.  You can apply chg directly  to the skin and wash                       Gently with a scrungie or clean washcloth.  5.  Apply the CHG Soap to your body ONLY FROM THE NECK DOWN.   Do not use on face/ open                           Wound or open sores. Avoid contact with eyes, ears mouth and genitals (private parts).                       Wash face,  Genitals (private parts) with your normal soap.             6.  Wash thoroughly, paying special attention to the area where your surgery  will be performed.  7.  Thoroughly rinse your body with  warm water from the neck down.  8.  DO NOT shower/wash with your normal soap after using and rinsing off  the CHG Soap.                9.  Pat yourself dry with a clean towel.            10.  Wear clean pajamas.            11.  Place clean sheets on your bed the night of your first shower and do not  sleep with pets. Day of Surgery : Do not apply any lotions/deodorants the morning of surgery.  Please wear clean clothes to the hospital/surgery center.  FAILURE TO FOLLOW THESE INSTRUCTIONS MAY RESULT IN THE CANCELLATION OF YOUR SURGERY PATIENT SIGNATURE_________________________________  NURSE SIGNATURE__________________________________  ________________________________________________________________________

## 2021-05-22 ENCOUNTER — Encounter (HOSPITAL_COMMUNITY): Payer: Self-pay

## 2021-05-22 ENCOUNTER — Other Ambulatory Visit: Payer: Self-pay

## 2021-05-22 ENCOUNTER — Encounter (HOSPITAL_COMMUNITY)
Admission: RE | Admit: 2021-05-22 | Discharge: 2021-05-22 | Disposition: A | Discharge status: Home / Self Care | Payer: Medicare HMO | Source: Ambulatory Visit | Attending: Orthopaedic Surgery | Admitting: Orthopaedic Surgery

## 2021-05-22 DIAGNOSIS — Z01812 Encounter for preprocedural laboratory examination: Secondary | ICD-10-CM | POA: Insufficient documentation

## 2021-05-22 HISTORY — DX: Thyrotoxicosis, unspecified without thyrotoxic crisis or storm: E05.90

## 2021-05-22 HISTORY — DX: Unspecified osteoarthritis, unspecified site: M19.90

## 2021-05-22 HISTORY — DX: Malignant (primary) neoplasm, unspecified: C80.1

## 2021-05-22 LAB — CBC
HCT: 32.5 % — ABNORMAL LOW (ref 36.0–46.0)
Hemoglobin: 10.2 g/dL — ABNORMAL LOW (ref 12.0–15.0)
MCH: 29.7 pg (ref 26.0–34.0)
MCHC: 31.4 g/dL (ref 30.0–36.0)
MCV: 94.8 fL (ref 80.0–100.0)
Platelets: 247 10*3/uL (ref 150–400)
RBC: 3.43 MIL/uL — ABNORMAL LOW (ref 3.87–5.11)
RDW: 14.6 % (ref 11.5–15.5)
WBC: 5.1 10*3/uL (ref 4.0–10.5)
nRBC: 0 % (ref 0.0–0.2)

## 2021-05-22 LAB — BASIC METABOLIC PANEL
Anion gap: 10 (ref 5–15)
BUN: 19 mg/dL (ref 8–23)
CO2: 26 mmol/L (ref 22–32)
Calcium: 9.1 mg/dL (ref 8.9–10.3)
Chloride: 103 mmol/L (ref 98–111)
Creatinine, Ser: 1.06 mg/dL — ABNORMAL HIGH (ref 0.44–1.00)
GFR, Estimated: 56 mL/min — ABNORMAL LOW (ref >60)
Glucose, Bld: 90 mg/dL (ref 70–99)
Potassium: 4.2 mmol/L (ref 3.5–5.1)
Sodium: 139 mmol/L (ref 135–145)

## 2021-05-23 ENCOUNTER — Telehealth: Payer: Self-pay | Admitting: Orthopaedic Surgery

## 2021-05-23 LAB — SURGICAL PCR SCREEN
MRSA, PCR: POSITIVE — AB
Staphylococcus aureus: POSITIVE — AB

## 2021-05-23 NOTE — Telephone Encounter (Signed)
Patients questions answered

## 2021-05-23 NOTE — Telephone Encounter (Signed)
Patient called. She has some questions about her surgery. Would like a call back. Her number is [phone]

## 2021-05-27 ENCOUNTER — Telehealth: Payer: Self-pay | Admitting: *Deleted

## 2021-05-27 ENCOUNTER — Other Ambulatory Visit: Payer: Self-pay | Admitting: Orthopaedic Surgery

## 2021-05-27 NOTE — Care Plan (Signed)
RNCM call to patient to discuss her upcoming Right total knee arthroplasty with Dr. Ninfa Linden on Friday, 05/30/21. She is an Ortho bundle patient through [facility] and is agreeable to case management. She lives with her spouse, who will be able to assist after discharge, but also has a sister that will be coming to stay and help. Anticipate HHPT will be needed and referral made to [facility] after choice provided. She will need a RW and 3in1/BSC. Referral made to [place]. Reviewed all post op care instructions. Will continue to follow for needs.

## 2021-05-27 NOTE — Telephone Encounter (Signed)
Ortho bundle Pre-op call completed. 

## 2021-05-28 LAB — SARS CORONAVIRUS 2 (TAT 6-24 HRS): SARS Coronavirus 2: NEGATIVE

## 2021-05-29 DIAGNOSIS — M1711 Unilateral primary osteoarthritis, right knee: Secondary | ICD-10-CM

## 2021-05-29 NOTE — H&P (Signed)
TOTAL KNEE ADMISSION H&P  Patient is being admitted for right total knee arthroplasty.  Subjective:  Chief Complaint:right knee pain.  HPI: Kathleen Peters, 70 y.o. female, has a history of pain and functional disability in the right knee due to arthritis and has failed non-surgical conservative treatments for greater than 12 weeks to includeNSAID's and/or analgesics, corticosteriod injections, flexibility and strengthening excercises, use of assistive devices, and activity modification.  Onset of symptoms was abrupt, starting 1 years ago with gradually worsening course since that time. The patient noted no past surgery on the right knee(s).  Patient currently rates pain in the right knee(s) at 9 out of 10 with activity. Patient has worsening of pain with activity and weight bearing, pain that interferes with activities of daily living, pain with passive range of motion, and joint swelling.  Patient has evidence of subchondral sclerosis, periarticular osteophytes, and joint space narrowing by imaging studies. There is no active infection.  Patient Active Problem List   Diagnosis Date Noted   Unilateral primary osteoarthritis, right knee 05/29/2021   Unilateral primary osteoarthritis, left knee 04/01/2021   Acute appendicitis 01/28/2021   Hyperthyroidism 04/14/2020   Heterozygous factor V Leiden mutation ([place]) 09/23/2015   Lupus anticoagulant positive 09/23/2015   Varicose veins of both lower extremities 09/23/2015   Thrombophlebitis of superficial veins of right lower extremity 04/24/2014   Past Medical History:  Diagnosis Date   Arthritis    Back pain    Cancer ([place])    hx of skin cancer   Graves disease    Hyperthyroidism    Insomnia    Migraines     Past Surgical History:  Procedure Laterality Date   ABDOMINAL HYSTERECTOMY     ELBOW FRACTURE SURGERY     LAPAROSCOPIC APPENDECTOMY N/A 01/29/2021   Procedure: APPENDECTOMY LAPAROSCOPIC;  Surgeon: Clovis Riley, MD;  Location: [facility]  [facility];  Service: General;  Laterality: N/A;   TONSILLECTOMY     TYMPANOSTOMY TUBE PLACEMENT      No current facility-administered medications for this encounter.   Current Outpatient Medications  Medication Sig Dispense Refill Last Dose   acetaminophen (TYLENOL) 650 MG CR tablet Take 650 mg by mouth every 8 (eight) hours as needed for pain.      atenolol (TENORMIN) 25 MG tablet Take 25 mg by mouth every other day.  3    Cholecalciferol (VITAMIN D) 50 MCG (2000 UT) tablet Take 2,000 Units by mouth daily.      eletriptan (RELPAX) 40 MG tablet Take 40 mg by mouth as needed for migraine.   1    estradiol (ESTRACE) 2 MG tablet Take 1 mg by mouth daily.      fluconazole (DIFLUCAN) 150 MG tablet Take 150 mg by mouth every Monday.      GLUCOSAMINE-CHONDROITIN PO Take 2 tablets by mouth 2 (two) times a week.      ibuprofen (ADVIL) 200 MG tablet Take 200 mg by mouth every 6 (six) hours as needed for moderate pain or headache.      loratadine (CLARITIN) 10 MG tablet Take 10 mg by mouth daily as needed for allergies or rhinitis.      LORazepam (ATIVAN) 1 MG tablet Take 1 mg by mouth at bedtime as needed for sleep.  5    methimazole (TAPAZOLE) 10 MG tablet Take 1 tablet (10 mg total) by mouth daily. (Patient taking differently: Take 5 mg by mouth every other day.) 90 tablet 1    Multiple Vitamin (MULTI-VITAMINS) TABS  Take 1 tablet by mouth daily.       naproxen sodium (ALEVE) 220 MG tablet Take 220 mg by mouth 2 (two) times daily as needed (pain).      Omega-3 Fatty Acids (FISH OIL) 1000 MG CAPS Take 1,000 mg by mouth daily.      traMADol (ULTRAM) 50 MG tablet Take 50 mg by mouth daily as needed for moderate pain.      No Known Allergies  Social History   Tobacco Use   Smoking status: Never   Smokeless tobacco: Never  Substance Use Topics   Alcohol use: Yes    Comment: soc    Family History  Problem Relation Age of Onset   Thyroid cancer Sister      Review of Systems  Musculoskeletal:   Positive for gait problem and joint swelling.  All other systems reviewed and are negative.  Objective:  Physical Exam Vitals reviewed.  Constitutional:      Appearance: Normal appearance.  HENT:     Head: Normocephalic and atraumatic.  Eyes:     Extraocular Movements: Extraocular movements intact.     Pupils: Pupils are equal, round, and reactive to light.  Cardiovascular:     Rate and Rhythm: Normal rate.  Pulmonary:     Effort: Pulmonary effort is normal.  Abdominal:     Palpations: Abdomen is soft.  Musculoskeletal:     Cervical back: Normal range of motion and neck supple.     Right knee: Effusion, bony tenderness and crepitus present. Decreased range of motion. Tenderness present over the medial joint line and lateral joint line. Abnormal alignment.  Neurological:     Mental Status: She is alert and oriented to person, place, and time.  Psychiatric:        Behavior: Behavior normal.    Vital signs in last 24 hours:    Labs:   Estimated body mass index is 23.69 kg/m as calculated from the following:   Height as of 05/22/21: 5\' 4"  (1.626 m).   Weight as of 05/22/21: 62.6 kg.   Imaging Review Plain radiographs demonstrate severe degenerative joint disease of the right knee(s). The overall alignment ismild valgus. The bone quality appears to be good for age and reported activity level.      Assessment/Plan:  End stage arthritis, right knee   The patient history, physical examination, clinical judgment of the provider and imaging studies are consistent with end stage degenerative joint disease of the right knee(s) and total knee arthroplasty is deemed medically necessary. The treatment options including medical management, injection therapy arthroscopy and arthroplasty were discussed at length. The risks and benefits of total knee arthroplasty were presented and reviewed. The risks due to aseptic loosening, infection, stiffness, patella tracking problems,  thromboembolic complications and other imponderables were discussed. The patient acknowledged the explanation, agreed to proceed with the plan and consent was signed. Patient is being admitted for inpatient treatment for surgery, pain control, PT, OT, prophylactic antibiotics, VTE prophylaxis, progressive ambulation and ADL's and discharge planning. The patient is planning to be discharged home with home health services

## 2021-05-30 ENCOUNTER — Other Ambulatory Visit: Payer: Self-pay

## 2021-05-30 ENCOUNTER — Ambulatory Visit (HOSPITAL_COMMUNITY): Payer: Medicare HMO | Admitting: Physician Assistant

## 2021-05-30 ENCOUNTER — Encounter (HOSPITAL_COMMUNITY): Payer: Self-pay | Admitting: Orthopaedic Surgery

## 2021-05-30 ENCOUNTER — Observation Stay (HOSPITAL_COMMUNITY): Payer: Medicare HMO

## 2021-05-30 ENCOUNTER — Encounter (HOSPITAL_COMMUNITY)
Admission: RE | Disposition: A | Discharge status: Home / Self Care | Payer: Self-pay | Source: Ambulatory Visit | Attending: Orthopaedic Surgery

## 2021-05-30 ENCOUNTER — Observation Stay (HOSPITAL_COMMUNITY)
Admission: RE | Admit: 2021-05-30 | Discharge: 2021-06-01 | Disposition: A | Discharge status: Home / Self Care | Payer: Medicare HMO | Source: Ambulatory Visit | Attending: Orthopaedic Surgery | Admitting: Orthopaedic Surgery

## 2021-05-30 ENCOUNTER — Ambulatory Visit (HOSPITAL_COMMUNITY): Payer: Medicare HMO | Admitting: Anesthesiology

## 2021-05-30 DIAGNOSIS — Z96651 Presence of right artificial knee joint: Secondary | ICD-10-CM | POA: Diagnosis not present

## 2021-05-30 DIAGNOSIS — M21061 Valgus deformity, not elsewhere classified, right knee: Secondary | ICD-10-CM | POA: Diagnosis not present

## 2021-05-30 DIAGNOSIS — Z85828 Personal history of other malignant neoplasm of skin: Secondary | ICD-10-CM | POA: Insufficient documentation

## 2021-05-30 DIAGNOSIS — G8918 Other acute postprocedural pain: Secondary | ICD-10-CM | POA: Diagnosis not present

## 2021-05-30 DIAGNOSIS — Z79899 Other long term (current) drug therapy: Secondary | ICD-10-CM | POA: Insufficient documentation

## 2021-05-30 DIAGNOSIS — M1711 Unilateral primary osteoarthritis, right knee: Principal | ICD-10-CM

## 2021-05-30 DIAGNOSIS — G43909 Migraine, unspecified, not intractable, without status migrainosus: Secondary | ICD-10-CM | POA: Diagnosis not present

## 2021-05-30 HISTORY — PX: TOTAL KNEE ARTHROPLASTY: SHX125

## 2021-05-30 LAB — ABO/RH: ABO/RH(D): A POS

## 2021-05-30 LAB — TYPE AND SCREEN
ABO/RH(D): A POS
Antibody Screen: NEGATIVE

## 2021-05-30 SURGERY — ARTHROPLASTY, KNEE, TOTAL
Anesthesia: Spinal | Site: Knee | Laterality: Right

## 2021-05-30 MED ORDER — VANCOMYCIN HCL IN DEXTROSE 1-5 GM/200ML-% IV SOLN
1000.0000 mg | INTRAVENOUS | Status: AC
  Administered 2021-05-30: 1000 mg via INTRAVENOUS
  Filled 2021-05-30: qty 200

## 2021-05-30 MED ORDER — LORAZEPAM 1 MG PO TABS
1.0000 mg | ORAL_TABLET | Freq: Every evening | ORAL | Status: DC | PRN
  Administered 2021-05-30 – 2021-05-31 (×2): 1 mg via ORAL
  Filled 2021-05-30 (×2): qty 1

## 2021-05-30 MED ORDER — DIPHENHYDRAMINE HCL 12.5 MG/5ML PO ELIX
12.5000 mg | ORAL_SOLUTION | ORAL | Status: DC | PRN
  Filled 2021-05-30: qty 10

## 2021-05-30 MED ORDER — CELECOXIB 200 MG PO CAPS
200.0000 mg | ORAL_CAPSULE | Freq: Once | ORAL | Status: AC
  Administered 2021-05-30: 200 mg via ORAL
  Filled 2021-05-30: qty 1

## 2021-05-30 MED ORDER — ROPIVACAINE HCL 5 MG/ML IJ SOLN
INTRAMUSCULAR | Status: DC | PRN
  Administered 2021-05-30: 30 mL via PERINEURAL

## 2021-05-30 MED ORDER — CHLORHEXIDINE GLUCONATE 0.12 % MT SOLN
15.0000 mL | Freq: Once | OROMUCOSAL | Status: AC
  Administered 2021-05-30: 15 mL via OROMUCOSAL

## 2021-05-30 MED ORDER — MIDAZOLAM HCL 2 MG/2ML IJ SOLN
1.0000 mg | INTRAMUSCULAR | Status: DC
  Administered 2021-05-30: 1 mg via INTRAVENOUS
  Filled 2021-05-30: qty 2

## 2021-05-30 MED ORDER — ONDANSETRON HCL 4 MG/2ML IJ SOLN: 4.0000 mg | Freq: Four times a day (QID) | INTRAMUSCULAR | Status: DC | PRN

## 2021-05-30 MED ORDER — ONDANSETRON HCL 4 MG PO TABS: 4.0000 mg | ORAL_TABLET | Freq: Four times a day (QID) | ORAL | Status: DC | PRN

## 2021-05-30 MED ORDER — ONDANSETRON HCL 4 MG/2ML IJ SOLN
INTRAMUSCULAR | Status: AC
  Filled 2021-05-30: qty 2

## 2021-05-30 MED ORDER — LACTATED RINGERS IV SOLN: INTRAVENOUS | Status: DC

## 2021-05-30 MED ORDER — HYDROMORPHONE HCL 1 MG/ML IJ SOLN
0.5000 mg | INTRAMUSCULAR | Status: DC | PRN
  Administered 2021-05-30: 1 mg via INTRAVENOUS
  Administered 2021-05-31: 0.5 mg via INTRAVENOUS
  Filled 2021-05-30 (×2): qty 1

## 2021-05-30 MED ORDER — OXYCODONE HCL 5 MG PO TABS
5.0000 mg | ORAL_TABLET | ORAL | Status: DC | PRN
  Administered 2021-05-30: 5 mg via ORAL
  Administered 2021-05-30 – 2021-06-01 (×3): 10 mg via ORAL
  Filled 2021-05-30 (×3): qty 2
  Filled 2021-05-30: qty 1
  Filled 2021-05-30 (×3): qty 2

## 2021-05-30 MED ORDER — METHIMAZOLE 5 MG PO TABS
5.0000 mg | ORAL_TABLET | ORAL | Status: DC
  Administered 2021-06-01: 5 mg via ORAL
  Filled 2021-05-30: qty 1

## 2021-05-30 MED ORDER — ALUM & MAG HYDROXIDE-SIMETH 200-200-20 MG/5ML PO SUSP: 30.0000 mL | ORAL | Status: DC | PRN

## 2021-05-30 MED ORDER — BUPIVACAINE-EPINEPHRINE 0.25% -1:200000 IJ SOLN
INTRAMUSCULAR | Status: DC | PRN
  Administered 2021-05-30: 30 mL

## 2021-05-30 MED ORDER — PROPOFOL 500 MG/50ML IV EMUL
INTRAVENOUS | Status: DC | PRN
  Administered 2021-05-30: 50 ug/kg/min via INTRAVENOUS

## 2021-05-30 MED ORDER — PANTOPRAZOLE SODIUM 40 MG PO TBEC
40.0000 mg | DELAYED_RELEASE_TABLET | Freq: Every day | ORAL | Status: DC
  Administered 2021-05-30 – 2021-06-01 (×3): 40 mg via ORAL
  Filled 2021-05-30 (×3): qty 1

## 2021-05-30 MED ORDER — PROPOFOL 500 MG/50ML IV EMUL
INTRAVENOUS | Status: AC
  Filled 2021-05-30: qty 50

## 2021-05-30 MED ORDER — OXYCODONE HCL 5 MG PO TABS
10.0000 mg | ORAL_TABLET | ORAL | Status: DC | PRN
  Administered 2021-05-31 (×2): 10 mg via ORAL
  Administered 2021-05-31: 15 mg via ORAL
  Administered 2021-06-01 (×2): 10 mg via ORAL
  Filled 2021-05-30: qty 3
  Filled 2021-05-30 (×2): qty 2

## 2021-05-30 MED ORDER — ONDANSETRON HCL 4 MG/2ML IJ SOLN
INTRAMUSCULAR | Status: DC | PRN
  Administered 2021-05-30: 4 mg via INTRAVENOUS

## 2021-05-30 MED ORDER — POVIDONE-IODINE 10 % EX SWAB
2.0000 "application " | Freq: Once | CUTANEOUS | Status: AC
  Administered 2021-05-30: 2 via TOPICAL

## 2021-05-30 MED ORDER — METHOCARBAMOL 500 MG PO TABS
500.0000 mg | ORAL_TABLET | Freq: Four times a day (QID) | ORAL | Status: DC | PRN
  Administered 2021-05-30 – 2021-05-31 (×3): 500 mg via ORAL
  Filled 2021-05-30 (×4): qty 1

## 2021-05-30 MED ORDER — ROPIVACAINE HCL 5 MG/ML IJ SOLN: INTRAMUSCULAR | Status: DC | PRN

## 2021-05-30 MED ORDER — ACETAMINOPHEN 325 MG PO TABS
325.0000 mg | ORAL_TABLET | Freq: Four times a day (QID) | ORAL | Status: DC | PRN
  Administered 2021-05-31: 650 mg via ORAL
  Filled 2021-05-30: qty 2

## 2021-05-30 MED ORDER — BUPIVACAINE IN DEXTROSE 0.75-8.25 % IT SOLN
INTRATHECAL | Status: DC | PRN
  Administered 2021-05-30: 1.6 mL via INTRATHECAL

## 2021-05-30 MED ORDER — ATENOLOL 25 MG PO TABS
25.0000 mg | ORAL_TABLET | ORAL | Status: DC
  Administered 2021-05-31: 25 mg via ORAL
  Filled 2021-05-30 (×2): qty 1

## 2021-05-30 MED ORDER — 0.9 % SODIUM CHLORIDE (POUR BTL) OPTIME
TOPICAL | Status: DC | PRN
  Administered 2021-05-30: 1000 mL

## 2021-05-30 MED ORDER — PROPOFOL 10 MG/ML IV BOLUS
INTRAVENOUS | Status: DC | PRN
  Administered 2021-05-30: 30 mg via INTRAVENOUS
  Administered 2021-05-30 (×2): 20 mg via INTRAVENOUS

## 2021-05-30 MED ORDER — DEXAMETHASONE SODIUM PHOSPHATE 10 MG/ML IJ SOLN
INTRAMUSCULAR | Status: AC
  Filled 2021-05-30: qty 1

## 2021-05-30 MED ORDER — FENTANYL CITRATE PF 50 MCG/ML IJ SOSY
50.0000 ug | PREFILLED_SYRINGE | INTRAMUSCULAR | Status: DC
Start: 2021-05-30 — End: 2021-05-30
  Administered 2021-05-30: 50 ug via INTRAVENOUS
  Filled 2021-05-30: qty 2

## 2021-05-30 MED ORDER — DOCUSATE SODIUM 100 MG PO CAPS
100.0000 mg | ORAL_CAPSULE | Freq: Two times a day (BID) | ORAL | Status: DC
  Administered 2021-05-30 – 2021-06-01 (×4): 100 mg via ORAL
  Filled 2021-05-30 (×4): qty 1

## 2021-05-30 MED ORDER — TRANEXAMIC ACID-NACL 1000-0.7 MG/100ML-% IV SOLN
1000.0000 mg | INTRAVENOUS | Status: AC
  Administered 2021-05-30: 1000 mg via INTRAVENOUS
  Filled 2021-05-30: qty 100

## 2021-05-30 MED ORDER — DEXAMETHASONE SODIUM PHOSPHATE 10 MG/ML IJ SOLN
INTRAMUSCULAR | Status: DC | PRN
  Administered 2021-05-30: 6 mg via INTRAVENOUS

## 2021-05-30 MED ORDER — MENTHOL 3 MG MT LOZG: 1.0000 | LOZENGE | OROMUCOSAL | Status: DC | PRN

## 2021-05-30 MED ORDER — VANCOMYCIN HCL IN DEXTROSE 1-5 GM/200ML-% IV SOLN
1000.0000 mg | Freq: Two times a day (BID) | INTRAVENOUS | Status: AC
  Administered 2021-05-30: 1000 mg via INTRAVENOUS
  Filled 2021-05-30: qty 200

## 2021-05-30 MED ORDER — BUPIVACAINE-EPINEPHRINE (PF) 0.25% -1:200000 IJ SOLN
INTRAMUSCULAR | Status: AC
  Filled 2021-05-30: qty 30

## 2021-05-30 MED ORDER — PHENOL 1.4 % MT LIQD: 1.0000 | OROMUCOSAL | Status: DC | PRN

## 2021-05-30 MED ORDER — VITAMIN D 25 MCG (1000 UNIT) PO TABS
2000.0000 [IU] | ORAL_TABLET | Freq: Every day | ORAL | Status: DC
  Administered 2021-05-31 – 2021-06-01 (×2): 2000 [IU] via ORAL
  Filled 2021-05-30 (×2): qty 2

## 2021-05-30 MED ORDER — CEFAZOLIN SODIUM-DEXTROSE 2-4 GM/100ML-% IV SOLN
2.0000 g | INTRAVENOUS | Status: AC
  Administered 2021-05-30: 2 g via INTRAVENOUS
  Filled 2021-05-30: qty 100

## 2021-05-30 MED ORDER — STERILE WATER FOR IRRIGATION IR SOLN
Status: DC | PRN
  Administered 2021-05-30: 2000 mL

## 2021-05-30 MED ORDER — ASPIRIN 81 MG PO CHEW
81.0000 mg | CHEWABLE_TABLET | Freq: Two times a day (BID) | ORAL | Status: DC
  Administered 2021-05-30 – 2021-06-01 (×4): 81 mg via ORAL
  Filled 2021-05-30 (×4): qty 1

## 2021-05-30 MED ORDER — SODIUM CHLORIDE 0.9 % IV SOLN: INTRAVENOUS | Status: DC

## 2021-05-30 MED ORDER — METHOCARBAMOL 500 MG IVPB - SIMPLE MED
500.0000 mg | Freq: Four times a day (QID) | INTRAVENOUS | Status: DC | PRN
  Filled 2021-05-30: qty 50

## 2021-05-30 MED ORDER — ESTRADIOL 1 MG PO TABS
1.0000 mg | ORAL_TABLET | Freq: Every day | ORAL | Status: DC
  Administered 2021-05-30 – 2021-05-31 (×2): 1 mg via ORAL
  Filled 2021-05-30 (×3): qty 1

## 2021-05-30 MED ORDER — METOCLOPRAMIDE HCL 5 MG PO TABS: 5.0000 mg | ORAL_TABLET | Freq: Three times a day (TID) | ORAL | Status: DC | PRN

## 2021-05-30 MED ORDER — ADULT MULTIVITAMIN W/MINERALS CH
1.0000 | ORAL_TABLET | Freq: Every day | ORAL | Status: DC
  Administered 2021-05-31 – 2021-06-01 (×2): 1 via ORAL
  Filled 2021-05-30 (×2): qty 1

## 2021-05-30 MED ORDER — PROPOFOL 10 MG/ML IV BOLUS
INTRAVENOUS | Status: AC
  Filled 2021-05-30: qty 20

## 2021-05-30 MED ORDER — ORAL CARE MOUTH RINSE
15.0000 mL | Freq: Once | OROMUCOSAL | Status: AC
Start: 2021-05-30 — End: 2021-05-30

## 2021-05-30 MED ORDER — ACETAMINOPHEN 500 MG PO TABS
1000.0000 mg | ORAL_TABLET | Freq: Once | ORAL | Status: AC
  Administered 2021-05-30: 1000 mg via ORAL
  Filled 2021-05-30: qty 2

## 2021-05-30 MED ORDER — PROMETHAZINE HCL 25 MG/ML IJ SOLN: 6.2500 mg | INTRAMUSCULAR | Status: DC | PRN

## 2021-05-30 MED ORDER — CHLORHEXIDINE GLUCONATE CLOTH 2 % EX PADS: 6.0000 | MEDICATED_PAD | Freq: Every day | CUTANEOUS | Status: DC

## 2021-05-30 MED ORDER — HYDROMORPHONE HCL 1 MG/ML IJ SOLN: 0.2500 mg | INTRAMUSCULAR | Status: DC | PRN

## 2021-05-30 MED ORDER — PHENYLEPHRINE HCL-NACL 20-0.9 MG/250ML-% IV SOLN
INTRAVENOUS | Status: DC | PRN
  Administered 2021-05-30: 30 ug/min via INTRAVENOUS

## 2021-05-30 MED ORDER — METOCLOPRAMIDE HCL 5 MG/ML IJ SOLN: 5.0000 mg | Freq: Three times a day (TID) | INTRAMUSCULAR | Status: DC | PRN

## 2021-05-30 MED ORDER — SODIUM CHLORIDE 0.9 % IR SOLN
Status: DC | PRN
  Administered 2021-05-30: 1000 mL

## 2021-05-30 SURGICAL SUPPLY — 63 items
APL SKNCLS STERI-STRIP NONHPOA (GAUZE/BANDAGES/DRESSINGS)
ATTUNE PS FEM RT SZ 5 CEM KNEE (Femur) ×2 IMPLANT
BAG COUNTER SPONGE SURGICOUNT (BAG) IMPLANT
BAG SPEC THK2 15X12 ZIP CLS (MISCELLANEOUS) ×1
BAG SPNG CNTER NS LX DISP (BAG)
BAG ZIPLOCK 12X15 (MISCELLANEOUS) ×2 IMPLANT
BASEPLATE TIB CMT FB PCKT SZ4 (Stem) ×2 IMPLANT
BENZOIN TINCTURE PRP APPL 2/3 (GAUZE/BANDAGES/DRESSINGS) IMPLANT
BLADE SAG 18X100X1.27 (BLADE) ×2 IMPLANT
BLADE SAW SGTL 11.0X1.19X90.0M (BLADE) ×1 IMPLANT
BLADE SURG SZ10 CARB STEEL (BLADE) ×4 IMPLANT
BNDG ELASTIC 6X5.8 VLCR STR LF (GAUZE/BANDAGES/DRESSINGS) ×4 IMPLANT
BOWL SMART MIX CTS (DISPOSABLE) ×1 IMPLANT
BSPLAT TIB 4 CMNT FX BRNG STRL (Stem) ×1 IMPLANT
CEMENT BONE REFOBACIN R1X40 US (Cement) IMPLANT
CEMENT HV SMART SET (Cement) ×2 IMPLANT
COOLER ICEMAN CLASSIC (MISCELLANEOUS) ×2 IMPLANT
COVER BACK TABLE 60X90IN (DRAPES) ×2 IMPLANT
COVER SURGICAL LIGHT HANDLE (MISCELLANEOUS) ×2 IMPLANT
CUFF TOURN SGL QUICK 34 (TOURNIQUET CUFF) ×2
CUFF TRNQT CYL 34X4.125X (TOURNIQUET CUFF) ×1 IMPLANT
DECANTER SPIKE VIAL GLASS SM (MISCELLANEOUS) IMPLANT
DRAPE INCISE IOBAN 66X45 STRL (DRAPES) ×6 IMPLANT
DRAPE SHEET LG 3/4 BI-LAMINATE (DRAPES) ×2 IMPLANT
DRAPE U-SHAPE 47X51 STRL (DRAPES) ×2 IMPLANT
DRSG PAD ABDOMINAL 8X10 ST (GAUZE/BANDAGES/DRESSINGS) ×4 IMPLANT
DURAPREP 26ML APPLICATOR (WOUND CARE) ×2 IMPLANT
ELECT BLADE TIP CTD 4 INCH (ELECTRODE) ×2 IMPLANT
ELECT REM PT RETURN 15FT ADLT (MISCELLANEOUS) ×2 IMPLANT
GAUZE SPONGE 4X4 12PLY STRL (GAUZE/BANDAGES/DRESSINGS) ×2 IMPLANT
GAUZE XEROFORM 1X8 LF (GAUZE/BANDAGES/DRESSINGS) ×2 IMPLANT
GLOVE SRG 8 PF TXTR STRL LF DI (GLOVE) ×2 IMPLANT
GLOVE SURG ENC MOIS LTX SZ7.5 (GLOVE) ×2 IMPLANT
GLOVE SURG NEOPR MICRO LF SZ8 (GLOVE) ×2 IMPLANT
GLOVE SURG UNDER POLY LF SZ8 (GLOVE) ×4
GOWN STRL REUS W/TWL XL LVL3 (GOWN DISPOSABLE) ×4 IMPLANT
HANDPIECE INTERPULSE COAX TIP (DISPOSABLE) ×2
HDLS TROCR DRIL PIN KNEE 75 (PIN)
HOLDER FOLEY CATH W/STRAP (MISCELLANEOUS) IMPLANT
IMMOBILIZER KNEE 20 (SOFTGOODS) ×2
IMMOBILIZER KNEE 20 THIGH 36 (SOFTGOODS) ×1 IMPLANT
INSERT TIB ATTUNE FB SZ5X7 (Insert) ×1 IMPLANT
KIT TURNOVER KIT A (KITS) ×2 IMPLANT
NS IRRIG 1000ML POUR BTL (IV SOLUTION) ×2 IMPLANT
PACK TOTAL KNEE CUSTOM (KITS) ×2 IMPLANT
PAD COLD SHLDR WRAP-ON (PAD) ×2 IMPLANT
PADDING CAST COTTON 6X4 STRL (CAST SUPPLIES) ×4 IMPLANT
PATELLA MEDIAL ATTUN 35MM KNEE (Knees) ×2 IMPLANT
PIN DRILL HDLS TROCAR 75 4PK (PIN) IMPLANT
PROTECTOR NERVE ULNAR (MISCELLANEOUS) ×2 IMPLANT
SCREW FEMALE HEX FIX 25X2.5 (ORTHOPEDIC DISPOSABLE SUPPLIES) IMPLANT
SET HNDPC FAN SPRY TIP SCT (DISPOSABLE) ×1 IMPLANT
SET PAD KNEE POSITIONER (MISCELLANEOUS) ×2 IMPLANT
STAPLER VISISTAT 35W (STAPLE) IMPLANT
STRIP CLOSURE SKIN 1/2X4 (GAUZE/BANDAGES/DRESSINGS) IMPLANT
SUT MNCRL AB 4-0 PS2 18 (SUTURE) IMPLANT
SUT VIC AB 0 CT1 27 (SUTURE) ×2
SUT VIC AB 0 CT1 27XBRD ANTBC (SUTURE) ×1 IMPLANT
SUT VIC AB 1 CT1 36 (SUTURE) ×4 IMPLANT
SUT VIC AB 2-0 CT1 27 (SUTURE) ×4
SUT VIC AB 2-0 CT1 TAPERPNT 27 (SUTURE) ×2 IMPLANT
TRAY FOLEY MTR SLVR 16FR STAT (SET/KITS/TRAYS/PACK) IMPLANT
WATER STERILE IRR 1000ML POUR (IV SOLUTION) ×4 IMPLANT

## 2021-05-30 NOTE — Progress Notes (Signed)
Assisted Dr. Hollis with right, ultrasound guided, adductor canal block. Side rails up, monitors on throughout procedure. See vital signs in flow sheet. Tolerated Procedure well.  

## 2021-05-30 NOTE — Care Plan (Signed)
Ortho Bundle Case Management Note  Patient Details  Name: Kathleen Peters MRN: [id number] Date of Birth: [birth year]                  [facility] call to patient to discuss her upcoming Right total knee arthroplasty with Dr. Ninfa Linden on Friday, 05/30/21. She is an Ortho bundle patient through [facility] and is agreeable to case management. She lives with her spouse, who will be able to assist after discharge, but also has a sister that will be coming to stay and help. Anticipate HHPT will be needed and referral made to [facility] after choice provided. She will need a RW and 3in1/BSC. Referral made to [place]. Reviewed all post op care instructions. Will continue to follow for needs.   DME Arranged:  3-N-1, [place] rolling DME Agency:  [employer]  HH Arranged:  PT HH Agency:  [facility]  Additional Comments: Please contact me with any questions of if this plan should need to change.  Jamse Arn, RN, BSN, [employer]  [phone] 05/30/2021, 3:14 PM

## 2021-05-30 NOTE — Anesthesia Procedure Notes (Signed)
Anesthesia Regional Block: Adductor canal block   Pre-Anesthetic Checklist: , timeout performed,  Correct Patient, Correct Site, Correct Laterality,  Correct Procedure, Correct Position, site marked,  Risks and benefits discussed,  Surgical consent,  Pre-op evaluation,  At surgeon's request and post-op pain management  Laterality: Right  Prep: chloraprep       Needles:  Injection technique: Single-shot  Needle Type: Echogenic Stimulator Needle     Needle Length: 9cm  Needle Gauge: 21     Additional Needles:   Procedures:,,,, ultrasound used (permanent image in chart),,    Narrative:  Start time: 05/30/2021 1:00 PM End time: 05/30/2021 1:05 PM Injection made incrementally with aspirations every 5 mL.  Performed by: Personally  Anesthesiologist: Effie Berkshire, MD  Additional Notes: Patient tolerated the procedure well. Local anesthetic introduced in an incremental fashion under minimal resistance after negative aspirations. No paresthesias were elicited. After completion of the procedure, no acute issues were identified and patient continued to be monitored by RN.

## 2021-05-30 NOTE — Plan of Care (Signed)
  Problem: Education: Goal: Knowledge of General Education information will improve Description Including pain rating scale, medication(s)/side effects and non-pharmacologic comfort measures Outcome: Progressing   

## 2021-05-30 NOTE — Anesthesia Preprocedure Evaluation (Addendum)
Anesthesia Evaluation  Patient identified by MRN, date of birth, ID band Patient awake    Reviewed: Allergy & Precautions, NPO status , Patient's Chart, lab work & pertinent test results  History of Anesthesia Complications Negative for: history of anesthetic complications  Airway Mallampati: II  TM Distance: >3 FB Neck ROM: Full    Dental  (+) Dental Advisory Given, Teeth Intact   Pulmonary neg pulmonary ROS,    Pulmonary exam normal        Cardiovascular hypertension, Pt. on home beta blockers Normal cardiovascular exam Rhythm:Regular     Neuro/Psych  Headaches,    GI/Hepatic Neg liver ROS,   Endo/Other  Hyperthyroidism Grave's disease  Renal/GU negative Renal ROS     Musculoskeletal  (+) Arthritis ,   Abdominal   Peds  Hematology negative hematology ROS (+)   Anesthesia Other Findings   Reproductive/Obstetrics                            Anesthesia Physical Anesthesia Plan  ASA: 2  Anesthesia Plan: Spinal   Post-op Pain Management:  Regional for Post-op pain   Induction: Intravenous  PONV Risk Score and Plan: 3 and Treatment may vary due to age or medical condition, Ondansetron, Midazolam, Propofol infusion and TIVA  Airway Management Planned: Natural Airway  Additional Equipment:   Intra-op Plan:   Post-operative Plan:   Informed Consent: I have reviewed the patients History and Physical, chart, labs and discussed the procedure including the risks, benefits and alternatives for the proposed anesthesia with the patient or authorized representative who has indicated his/her understanding and acceptance.     Dental advisory given  Plan Discussed with: CRNA  Anesthesia Plan Comments:       Anesthesia Quick Evaluation

## 2021-05-30 NOTE — Brief Op Note (Signed)
05/30/2021  3:58 PM  PATIENT:  Kathleen Peters  70 y.o. female  PRE-OPERATIVE DIAGNOSIS:  OSTEOARTHRITIS RIGHT KNEE  POST-OPERATIVE DIAGNOSIS:  OSTEOARTHRITIS RIGHT KNEE  PROCEDURE:  Procedure(s): RIGHT TOTAL KNEE ARTHROPLASTY (Right)  SURGEON:  Surgeon(s) and Role:    * Mcarthur Rossetti, MD - Primary  PHYSICIAN ASSISTANT:  Benita Stabile, PA-C  ANESTHESIA:   local, regional, and spinal  EBL:  50 mL   COUNTS:  YES  TOURNIQUET:   Total Tourniquet Time Documented: Thigh (Right) - 49 minutes Total: Thigh (Right) - 49 minutes   DICTATION: .Other Dictation: Dictation Number [id number]  PLAN OF CARE: Admit for overnight observation  PATIENT DISPOSITION:  PACU - hemodynamically stable.   Delay start of Pharmacological VTE agent (>24hrs) due to surgical blood loss or risk of bleeding: no

## 2021-05-30 NOTE — Transfer of Care (Signed)
Immediate Anesthesia Transfer of Care Note  Patient: Kathleen Peters  Procedure(s) Performed: Procedure(s): RIGHT TOTAL KNEE ARTHROPLASTY (Right)  Patient Location: PACU  Anesthesia Type:Spinal  Level of Consciousness: awake, alert  and oriented  Airway & Oxygen Therapy: Patient Spontanous Breathing  Post-op Assessment: Report given to RN and Post -op Vital signs reviewed and stable  Post vital signs: Reviewed and stable  Last Vitals:  Vitals:   05/30/21 1337 05/30/21 1342  BP:    Pulse: (!) 51 (!) 50  Resp: 14 12  Temp:    SpO2: 628% 241%    Complications: No apparent anesthesia complications

## 2021-05-30 NOTE — Interval H&P Note (Signed)
History and Physical Interval Note: The patient understands fully that she is here today for a right knee replacement to treat her right knee osteoarthritis.  There has been no acute or interval change in her medical status.  Please see recent H&P.  The risks and benefits of surgery been explained in detail and informed consent is obtained.  The right operative knee has been marked.  05/30/2021 12:47 PM  Kathleen Peters  has presented today for surgery, with the diagnosis of OSTEOARTHRITIS RIGHT KNEE.  The various methods of treatment have been discussed with the patient and family. After consideration of risks, benefits and other options for treatment, the patient has consented to  Procedure(s): RIGHT TOTAL KNEE ARTHROPLASTY (Right) as a surgical intervention.  The patient's history has been reviewed, patient examined, no change in status, stable for surgery.  I have reviewed the patient's chart and labs.  Questions were answered to the patient's satisfaction.     Mcarthur Rossetti

## 2021-05-30 NOTE — Anesthesia Procedure Notes (Signed)
Spinal  Start time: 05/30/2021 2:29 PM End time: 05/30/2021 2:31 PM Reason for block: surgical anesthesia Staffing Performed: anesthesiologist  Anesthesiologist: Effie Berkshire, MD Preanesthetic Checklist Completed: patient identified, IV checked, site marked, risks and benefits discussed, surgical consent, monitors and equipment checked, pre-op evaluation and timeout performed Spinal Block Patient position: sitting Prep: DuraPrep and site prepped and draped Location: L3-4 Injection technique: single-shot Needle Needle type: Pencan  Needle gauge: 24 G Needle length: 10 cm Needle insertion depth: 10 cm Assessment Events: CSF return Additional Notes Patient tolerated well. No immediate complications.

## 2021-05-30 NOTE — Anesthesia Procedure Notes (Signed)
Date/Time: 05/30/2021 2:28 PM Performed by: Sharlette Dense, CRNA Oxygen Delivery Method: Simple face mask

## 2021-05-30 NOTE — Progress Notes (Signed)
Handbook has been given to patient.  

## 2021-05-31 DIAGNOSIS — R531 Weakness: Secondary | ICD-10-CM | POA: Diagnosis not present

## 2021-05-31 DIAGNOSIS — Z79899 Other long term (current) drug therapy: Secondary | ICD-10-CM | POA: Diagnosis not present

## 2021-05-31 DIAGNOSIS — M21061 Valgus deformity, not elsewhere classified, right knee: Secondary | ICD-10-CM | POA: Diagnosis not present

## 2021-05-31 DIAGNOSIS — M1711 Unilateral primary osteoarthritis, right knee: Secondary | ICD-10-CM | POA: Diagnosis not present

## 2021-05-31 DIAGNOSIS — Z85828 Personal history of other malignant neoplasm of skin: Secondary | ICD-10-CM | POA: Diagnosis not present

## 2021-05-31 LAB — CBC
HCT: 30.3 % — ABNORMAL LOW (ref 36.0–46.0)
Hemoglobin: 9.5 g/dL — ABNORMAL LOW (ref 12.0–15.0)
MCH: 29.1 pg (ref 26.0–34.0)
MCHC: 31.4 g/dL (ref 30.0–36.0)
MCV: 92.7 fL (ref 80.0–100.0)
Platelets: 234 10*3/uL (ref 150–400)
RBC: 3.27 MIL/uL — ABNORMAL LOW (ref 3.87–5.11)
RDW: 14.5 % (ref 11.5–15.5)
WBC: 7.3 10*3/uL (ref 4.0–10.5)
nRBC: 0 % (ref 0.0–0.2)

## 2021-05-31 LAB — BASIC METABOLIC PANEL
Anion gap: 7 (ref 5–15)
BUN: 12 mg/dL (ref 8–23)
CO2: 25 mmol/L (ref 22–32)
Calcium: 8.2 mg/dL — ABNORMAL LOW (ref 8.9–10.3)
Chloride: 105 mmol/L (ref 98–111)
Creatinine, Ser: 1.07 mg/dL — ABNORMAL HIGH (ref 0.44–1.00)
GFR, Estimated: 56 mL/min — ABNORMAL LOW (ref >60)
Glucose, Bld: 128 mg/dL — ABNORMAL HIGH (ref 70–99)
Potassium: 4.6 mmol/L (ref 3.5–5.1)
Sodium: 137 mmol/L (ref 135–145)

## 2021-05-31 MED ORDER — OXYCODONE HCL 5 MG PO TABS: 5.0000 mg | ORAL_TABLET | Freq: Four times a day (QID) | ORAL | 0 refills | Status: DC | PRN

## 2021-05-31 MED ORDER — ASPIRIN 81 MG PO CHEW: 81.0000 mg | CHEWABLE_TABLET | Freq: Two times a day (BID) | ORAL | 0 refills | Status: DC

## 2021-05-31 MED ORDER — METHOCARBAMOL 500 MG PO TABS: 500.0000 mg | ORAL_TABLET | Freq: Four times a day (QID) | ORAL | 1 refills | Status: DC | PRN

## 2021-05-31 NOTE — Discharge Instructions (Signed)

## 2021-05-31 NOTE — Plan of Care (Signed)

## 2021-05-31 NOTE — Progress Notes (Signed)
Physical Therapy Treatment Patient Details Name: Kathleen Peters MRN: [id number] DOB: [birth year] Today's Date: 05/31/2021   History of Present Illness Pt s/p R TKR and with hx of Graves disease    PT Comments    Pt continues cooperative but limited by elevated pain level despite pre-med. Pt up to bathroom for toileting and hygiene at sink and then positioned for comfort in bed.  Rn aware and providing breakthrough pain meds.   Recommendations for follow up therapy are one component of a multi-disciplinary discharge planning process, led by the attending physician.  Recommendations may be updated based on patient status, additional functional criteria and insurance authorization.  Follow Up Recommendations  Home health PT;Follow surgeon's recommendation for DC plan and follow-up therapies     Equipment Recommendations  Rolling walker with 5" wheels;3in1 (PT)    Recommendations for Other Services       Precautions / Restrictions Precautions Precautions: Fall;Knee Required Braces or Orthoses: Knee Immobilizer - Right Knee Immobilizer - Right: Discontinue once straight leg raise with < 10 degree lag Restrictions Weight Bearing Restrictions: No RLE Weight Bearing: Weight bearing as tolerated     Mobility  Bed Mobility Overal bed mobility: Needs Assistance Bed Mobility: Sit to Supine     Supine to sit: Min assist Sit to supine: Min assist   General bed mobility comments: cues for sequence and use of L LE to self assist.  Physical assist to manage R LE    Transfers Overall transfer level: Needs assistance Equipment used: Rolling walker (2 wheeled) Transfers: Sit to/from Stand Sit to Stand: Min assist         General transfer comment: cues for LE management and use of UEs to self assist  Ambulation/Gait Ambulation/Gait assistance: Min assist Gait Distance (Feet): 35 Feet (to/from bathroom) Assistive device: Rolling walker (2 wheeled) Gait Pattern/deviations: Step-to  pattern;Decreased step length - right;Decreased step length - left;Shuffle;Trunk flexed Gait velocity: decr   General Gait Details: Increased time with cues for posture, position from RW and sequence   Stairs             Wheelchair Mobility    Modified Rankin (Stroke Patients Only)       Balance Overall balance assessment: Needs assistance Sitting-balance support: No upper extremity supported;Feet supported Sitting balance-Leahy Scale: Good     Standing balance support: Bilateral upper extremity supported Standing balance-Leahy Scale: Poor                              Cognition Arousal/Alertness: Awake/alert Behavior During Therapy: WFL for tasks assessed/performed Overall Cognitive Status: Within Functional Limits for tasks assessed                                        Exercises Total Joint Exercises Ankle Circles/Pumps: AROM;Both;15 reps;Supine Quad Sets: AROM;Both;10 reps;Supine Heel Slides: AAROM;Right;15 reps;Supine Straight Leg Raises: AAROM;Right;15 reps;Supine    General Comments        Pertinent Vitals/Pain Pain Assessment: 0-10 Pain Score: 10-Worst pain ever Pain Location: R knee Pain Descriptors / Indicators: Aching;Sore Pain Intervention(s): Limited activity within patient's tolerance;Monitored during session;Premedicated before session;Ice applied;Patient requesting pain meds-RN notified    Home Living Family/patient expects to be discharged to:: Private residence Living Arrangements: Spouse/significant other Available Help at Discharge: Family Type of Home: House Home Access: Stairs to enter Entrance Stairs-Rails: Right  Home Layout: One level Home Equipment: Cane - single point      Prior Function Level of Independence: Independent          PT Goals (current goals can now be found in the care plan section) Acute Rehab PT Goals Patient Stated Goal: Regain iND PT Goal Formulation: With patient Time  For Goal Achievement: 06/07/21 Potential to Achieve Goals: Good Progress towards PT goals: Not progressing toward goals - comment (elevated pain level)    Frequency    7X/week      PT Plan Current plan remains appropriate    Co-evaluation              AM-PAC PT "6 Clicks" Mobility   Outcome Measure  Help needed turning from your back to your side while in a flat bed without using bedrails?: A Little Help needed moving from lying on your back to sitting on the side of a flat bed without using bedrails?: A Little Help needed moving to and from a bed to a chair (including a wheelchair)?: A Little Help needed standing up from a chair using your arms (e.g., wheelchair or bedside chair)?: A Little Help needed to walk in hospital room?: A Little Help needed climbing 3-5 steps with a railing? : A Lot 6 Click Score: 17    End of Session Equipment Utilized During Treatment: Gait belt;Right knee immobilizer Activity Tolerance: Patient tolerated treatment well Patient left: with call bell/phone within reach;with nursing/sitter in room;in bed;with bed alarm set Nurse Communication: Mobility status PT Visit Diagnosis: Difficulty in walking, not elsewhere classified (R26.2)     Time: 4132-4401 PT Time Calculation (min) (ACUTE ONLY): 28 min  Charges:  $Gait Training: 8-22 mins $Therapeutic Exercise: 8-22 mins $Therapeutic Activity: 8-22 mins                     [place] Pager [phone] Office [phone]    Aideen Fenster 05/31/2021, 4:00 PM

## 2021-05-31 NOTE — Discharge Summary (Signed)
Patient ID: Kathleen Peters MRN: [id number] DOB/AGE: Oct 31, 1950 70 y.o.  Admit date: 05/30/2021 Discharge date: 05/31/2021  Admission Diagnoses:  Principal Problem:   Unilateral primary osteoarthritis, right knee Active Problems:   Status post total right knee replacement   Discharge Diagnoses:  Same  Past Medical History:  Diagnosis Date   Arthritis    Back pain    Cancer ([place])    hx of skin cancer   Graves disease    Hyperthyroidism    Insomnia    Migraines     Surgeries: Procedure(s): RIGHT TOTAL KNEE ARTHROPLASTY on 05/30/2021   Consultants:   Discharged Condition: Improved  Hospital Course: YISEL MEGILL is an 70 y.o. female who was admitted 05/30/2021 for operative treatment ofUnilateral primary osteoarthritis, right knee. Patient has severe unremitting pain that affects sleep, daily activities, and work/hobbies. After pre-op clearance the patient was taken to the operating room on 05/30/2021 and underwent  Procedure(s): RIGHT TOTAL KNEE ARTHROPLASTY.    Patient was given perioperative antibiotics:  Anti-infectives (From admission, onward)    Start     Dose/Rate Route Frequency Ordered Stop   05/31/21 0000  vancomycin (VANCOCIN) IVPB 1000 mg/200 mL premix        1,000 mg 200 mL/hr over 60 Minutes Intravenous Every 12 hours 05/30/21 1730 05/31/21 0054   05/30/21 1145  vancomycin (VANCOCIN) IVPB 1000 mg/200 mL premix        1,000 mg 200 mL/hr over 60 Minutes Intravenous 60 min pre-op 05/30/21 1134 05/30/21 1425   05/30/21 1145  ceFAZolin (ANCEF) IVPB 2g/100 mL premix        2 g 200 mL/hr over 30 Minutes Intravenous On call to O.R. 05/30/21 1134 05/30/21 1439        Patient was given sequential compression devices, early ambulation, and chemoprophylaxis to prevent DVT.  Patient benefited maximally from hospital stay and there were no complications.    Recent vital signs: Patient Vitals for the past 24 hrs:  BP Temp Temp src Pulse Resp SpO2 Height Weight   05/31/21 1025 122/62 97.9 F (36.6 C) Oral 61 -- 100 % -- --  05/31/21 0530 (!) 99/56 97.9 F (36.6 C) -- 61 17 94 % -- --  05/31/21 0114 119/63 98.2 F (36.8 C) -- 64 17 100 % -- --  05/30/21 2039 109/83 97.7 F (36.5 C) -- (!) 58 17 100 % -- --  05/30/21 1742 (!) 132/54 (!) 97.5 F (36.4 C) Axillary (!) 50 14 100 % -- --  05/30/21 1715 (!) 129/56 -- -- (!) 54 (!) 22 100 % -- --  05/30/21 1700 121/67 97.6 F (36.4 C) -- (!) 48 16 100 % -- --  05/30/21 1645 (!) 99/55 -- -- (!) 52 10 99 % -- --  05/30/21 1630 (!) 105/57 -- -- (!) 53 12 96 % -- --  05/30/21 1616 100/80 97.7 F (36.5 C) -- (!) 57 14 100 % -- --  05/30/21 1342 -- -- -- (!) 50 12 100 % -- --  05/30/21 1337 -- -- -- (!) 51 14 100 % -- --  05/30/21 1332 117/65 -- -- (!) 50 12 100 % -- --  05/30/21 1327 -- -- -- (!) 53 20 100 % -- --  05/30/21 1322 -- -- -- (!) 52 13 100 % -- --  05/30/21 1317 119/65 -- -- (!) 50 11 100 % -- --  05/30/21 1312 -- -- -- (!) 51 12 100 % -- --  05/30/21 1307 -- -- -- Marland Kitchen  48 12 100 % -- --  05/30/21 1302 121/60 -- -- (!) 48 14 100 % -- --  05/30/21 1257 100/81 -- -- (!) 49 15 100 % -- --  05/30/21 1155 -- -- -- -- -- -- 5\' 4"  (1.626 m) 62.6 kg  05/30/21 1142 130/66 98.7 F (37.1 C) Oral 75 16 99 % -- --     Recent laboratory studies:  Recent Labs    05/31/21 0327  WBC 7.3  HGB 9.5*  HCT 30.3*  PLT 234  NA 137  K 4.6  CL 105  CO2 25  BUN 12  CREATININE 1.07*  GLUCOSE 128*  CALCIUM 8.2*     Discharge Medications:   Allergies as of 05/31/2021   No Known Allergies      Medication List     TAKE these medications    acetaminophen 650 MG CR tablet Commonly known as: TYLENOL Take 650 mg by mouth every 8 (eight) hours as needed for pain.   aspirin 81 MG chewable tablet Chew 1 tablet (81 mg total) by mouth 2 (two) times daily.   atenolol 25 MG tablet Commonly known as: TENORMIN Take 25 mg by mouth every other day.   eletriptan 40 MG tablet Commonly known as:  RELPAX Take 40 mg by mouth as needed for migraine.   estradiol 2 MG tablet Commonly known as: ESTRACE Take 1 mg by mouth daily.   Fish Oil 1000 MG Caps Take 1,000 mg by mouth daily.   fluconazole 150 MG tablet Commonly known as: DIFLUCAN Take 150 mg by mouth every Monday.   GLUCOSAMINE-CHONDROITIN PO Take 2 tablets by mouth 2 (two) times a week.   ibuprofen 200 MG tablet Commonly known as: ADVIL Take 200 mg by mouth every 6 (six) hours as needed for moderate pain or headache.   loratadine 10 MG tablet Commonly known as: CLARITIN Take 10 mg by mouth daily as needed for allergies or rhinitis.   LORazepam 1 MG tablet Commonly known as: ATIVAN Take 1 mg by mouth at bedtime as needed for sleep.   methimazole 10 MG tablet Commonly known as: TAPAZOLE Take 1 tablet (10 mg total) by mouth daily. What changed:  how much to take when to take this   methocarbamol 500 MG tablet Commonly known as: ROBAXIN Take 1 tablet (500 mg total) by mouth every 6 (six) hours as needed for muscle spasms.   Multi-Vitamins Tabs Take 1 tablet by mouth daily.   naproxen sodium 220 MG tablet Commonly known as: ALEVE Take 220 mg by mouth 2 (two) times daily as needed (pain).   oxyCODONE 5 MG immediate release tablet Commonly known as: Oxy IR/ROXICODONE Take 1-2 tablets (5-10 mg total) by mouth every 6 (six) hours as needed for moderate pain (pain score 4-6).   traMADol 50 MG tablet Commonly known as: ULTRAM Take 50 mg by mouth daily as needed for moderate pain.   Vitamin D 50 MCG (2000 UT) tablet Take 2,000 Units by mouth daily.               Durable Medical Equipment  (From admission, onward)           Start     Ordered   05/30/21 1731  DME 3 n 1  Once        05/30/21 1730   05/30/21 1731  DME Walker rolling  Once       Question Answer Comment  Walker: With 5 Inch Wheels   Patient needs  a walker to treat with the following condition Status post total right knee  replacement      05/30/21 1730            Diagnostic Studies: DG Knee Right Port  Result Date: 05/30/2021 CLINICAL DATA:  Post RIGHT total knee arthroplasty EXAM: PORTABLE RIGHT KNEE - 1-2 VIEW COMPARISON:  Portable exam 1632 hours compared to 12/10/2020 FINDINGS: Osseous mineralization normal. Components of RIGHT knee prosthesis identified in expected positions. No acute fracture, dislocation, or bone destruction. IMPRESSION: RIGHT knee prosthesis without acute complication. Electronically Signed   By: Lavonia Dana M.D.   On: 05/30/2021 16:58    Disposition: Discharge disposition: 01-Home or Self Care          Follow-up Information     Mcarthur Rossetti, MD. Go on 06/12/2021.   Specialty: Orthopedic Surgery Why: at 2:15 pm for your 2 week post op appointment with Dr. Ninfa Linden in his office. Contact information: [address] [place] [address] [phone]                  Signed: Mcarthur Rossetti 05/31/2021, 11:38 AM

## 2021-05-31 NOTE — TOC Initial Note (Signed)
Transition of Care [facility]) - Initial/Assessment Note    Patient Details  Name: Kathleen Peters MRN: [id number] Date of Birth: October 16, 1950  [facility] CM/SW Contact:    Joanne Chars, LCSW Phone Number: 05/31/2021, 11:21 AM  Clinical Narrative:       CSW spoke with pt, confirmed HH thought [facility].  DME already ordered per 10/7 note by Jamse Arn.  Pt lives with husband, permission given to speak with him, sister will also provide support at discharge.              Expected Discharge Plan: [place] Barriers to Discharge: No Barriers Identified   Patient Goals and CMS Choice Patient states their goals for this hospitalization and ongoing recovery are:: better walking CMS Medicare.gov Compare Post Acute Care list provided to:: Patient Choice offered to / list presented to : Patient  Expected Discharge Plan and Services Expected Discharge Plan: [place] In-house Referral: Clinical Social Work   Post Acute Care Choice: Home Health, Durable Medical Equipment Living arrangements for the past 2 months: [place]                 DME Arranged: 3-N-1, Walker rolling DME Agency: [employer]       HH Arranged: PT HH Agency: [facility] Date [place]: 05/31/21 Time [place]: 1114 Representative spoke with at [place]: [place] Arrangements/Services Living arrangements for the past 2 months: [place] with:: Spouse Patient language and need for interpreter reviewed:: Yes Do you feel safe going back to the place where you live?: Yes      Need for Family Participation in Patient Care: Yes (Comment) Care giver support system in place?: Yes (comment) Current home services: Other (comment) (none) Criminal Activity/Legal Involvement Pertinent to Current Situation/Hospitalization: No - Comment as needed  Activities of Daily Living Home Assistive Devices/Equipment:  Blood pressure cuff, Other (Comment), Hand-held shower hose ADL Screening (condition at time of admission) Patient's cognitive ability adequate to safely complete daily activities?: Yes Is the patient deaf or have difficulty hearing?: No Does the patient have difficulty seeing, even when wearing glasses/contacts?: No Does the patient have difficulty concentrating, remembering, or making decisions?: No Patient able to express need for assistance with ADLs?: Yes Does the patient have difficulty dressing or bathing?: No Independently performs ADLs?: Yes (appropriate for developmental age) Does the patient have difficulty walking or climbing stairs?: Yes Weakness of Legs: Right Weakness of Arms/Hands: None  Permission Sought/Granted Permission sought to share information with : Family Supports Permission granted to share information with : Yes, Verbal Permission Granted  Share Information with NAME: husband Christy Sartorius           Emotional Assessment Appearance:: Appears stated age Attitude/Demeanor/Rapport: Engaged Affect (typically observed): Appropriate, Pleasant Orientation: : Oriented to Self, Oriented to Place, Oriented to  Time, Oriented to Situation Alcohol / Substance Use: Not Applicable Psych Involvement: No (comment)  Admission diagnosis:  Status post total right knee replacement [Z96.651] Patient Active Problem List   Diagnosis Date Noted   Status post total right knee replacement 05/30/2021   Unilateral primary osteoarthritis, right knee 05/29/2021   Unilateral primary osteoarthritis, left knee 04/01/2021   Acute appendicitis 01/28/2021   Hyperthyroidism 04/14/2020   Heterozygous factor V Leiden mutation ([place]) 09/23/2015   Lupus anticoagulant positive 09/23/2015   Varicose veins of both lower extremities 09/23/2015   Thrombophlebitis of superficial veins of right  lower extremity 04/24/2014   PCP:  Antony Contras, MD Pharmacy:   [facility] [id number] -  [place], [place] [address] Phone: [phone] Fax: [phone]  [place], [place] [address] Phone: [phone] Fax: [phone]     Social Determinants of Health (SDOH) Interventions    Readmission Risk Interventions No flowsheet data found.

## 2021-05-31 NOTE — Op Note (Signed)
Kathleen Peters, Kathleen Peters MEDICAL RECORD NO: [id number] ACCOUNT NO: [id number] DATE OF BIRTH: 27-Jan-1951 FACILITY: [id number] PHYSICIAN: Lind Guest. Ninfa Linden, MD  Operative Report   DATE OF PROCEDURE: 05/30/2021  PREOPERATIVE DIAGNOSES:  Primary osteoarthritis and degenerative joint disease with slight valgus malalignment, right knee.  POSTOPERATIVE DIAGNOSES:  Primary osteoarthritis and degenerative joint disease with slight valgus malalignment, right knee.  PROCEDURE:  Right total knee arthroplasty.  IMPLANTS:  DePuy Attune cemented knee system with size 5 right cemented femur, size 4 fixed bearing tibial tray, size 7 mm fixed bearing polyethylene insert, size 35 patella  button.  SURGEON:  Lind Guest. Ninfa Linden, MD.  ASSISTANT:  Erskine Emery, PA-C   ANESTHESIA:   1.  Right lower extremity adductor canal block. 2.  Spinal. 3.  Local with 0.25% Marcaine with epinephrine around the arthrotomy.  ANTIBIOTICS:  2 g IV Ancef and 1 gram IV vancomycin.  TOURNIQUET TIME:  Under 1 hour.  BLOOD LOSS:  Less than 967 mL  COMPLICATIONS:  None.  INDICATIONS:  The patient is a 70 year old female with debilitating arthritis involving her right knee that has been well documented.  This was confirmed with x-rays and clinical exam.  Even an MRI showed significant full-thickness cartilage loss on the  lateral aspect of her knee with a valgus malalignment.  There is also significant cartilage loss in the patellofemoral joint.  She has tried and failed all forms of conservative treatment for over 12 months now.  At this point, she does wish to proceed  with a total knee arthroplasty of the right side given the failure of conservative treatment and given the fact that her knee hurts on a daily basis and it is detrimentally affecting her mobility, her quality of life and activities of daily living.  We  did talk in length about knee replacement surgery.  We discussed the risks and benefits  of surgery including the risk of acute blood loss anemia, nerve or vessel injury, fracture, infection, DVT, implant failure and skin and soft tissue issues.  Talked  about our goals of being decrease pain, improve mobility and overall improve quality of life.  DESCRIPTION OF PROCEDURE:  After informed consent was obtained, appropriate right knee was marked.  An adductor canal block was obtained to the right lower extremity in the holding room.  She was then brought to the operating room and sat up on the  operating table.  Spinal anesthesia was obtained.  She was laid in supine position on the operating table.  Foley catheter was placed and a nonsterile tourniquet was placed around her upper right thigh.  Her right thigh, knee, leg, and ankle were prepped  and draped with DuraPrep and sterile drapes including a sterile stockinette.  A timeout was called.  She was identified as correct patient, correct right knee.  We then used Esmarch to wrap that leg and tourniquet was inflated to 300 mm of pressure.  I  then made a midline incision over the patella and carried this proximally and distally.  We dissected down the knee joint, carried out a medial parapatellar arthrotomy, finding a large joint effusion.  With the knee in a flexed position, we removed  remnants of ACL, PCL, medial and lateral meniscus as well as periarticular osteophytes.  We found a wide area of complete cartilage wear on the lateral compartment of the knee and the patellofemoral joint.  We then proceeded with our first cut being our  proximal tibia cut.  We selected  this for taking 2 mm off the high side, correcting for varus and valgus in slope with 3 degrees.  I made this cut without difficulty.  We then went to the femur side and used an intramedullary guide for distal femoral  cut, setting this for right knee at 5 degrees externally rotated for a 9 mm distal femoral cut.  We made this cut without difficulty, and brought the knee back  down to full extension and with a 5 mm extension block for the DePuy Attune system.  She had  full extension.  We then backed to the femur and put our femoral sizing guide based off the epicondylar axis.  Based off of this, we chose a size 5 femur.  We put a 4-in-1 cutting block for a size 5 femur, made our anterior and posterior cuts, followed  by our chamfer cuts.  We then made our femoral box cut.  Attention was then turned back to the tibia, which was a size 4 tibial tray for coverage of the tibial plateau setting the rotation off the tibial tubercle and the femur.  We did our keel punch and  drilled hole for a size 4 tibia with a size 4 tibial tray followed by size 5 right femur.  We tried a 5 and then a 7 mm fixed bearing polyethylene insert.  We were pleased with range of motion and stability with a 7 mm insert.  We then made our patellar  cut and drilled 3 holes for a size 35 patellar button.  With all trial instrumentation of the knee, I put the knee through several cycles of motion and we were pleased with range of motion and stability.  We then removed all trial instrumentation from  the knee and irrigated the knee with normal saline solution.  We dried the knee real well and then placed our Marcaine with epinephrine around the arthrotomy while the cement was mixed.  Once the cement was mixed, we put the knee in a flexed position and  cemented our real DePuy Attune tibial tray, size 4, followed by a real size 5 right femur.  We removed cement debris from the knee and placed our 7 mm fixed bearing polyethylene insert.  We also cemented our patellar button.  We then held the knee in a  fully extended position and compressed this while the cement hardened.  Once it hardened, we let the tourniquet down.  Hemostasis was obtained with electrocautery, put the knee through several more cycles range of motion and then we closed the arthrotomy  with interrupted #1 Vicryl suture followed by 0 Vicryl to close  deep tissue and 2-0 Vicryl to close the subcutaneous tissue.  The skin was reapproximated with staples.  Xeroform and well-padded sterile dressing applied.  She was then taken to recovery  room in stable condition with all final counts being correct and no complications noted.  Of note, Benita Stabile, PA-C, did assist during the entire case and assistance was crucial for facilitating every aspect of this case from beginning to end.   PAA D: 05/30/2021 3:57:14 pm T: 05/31/2021 12:36:00 am  JOB: [id number]/ [id number]

## 2021-05-31 NOTE — Progress Notes (Signed)
Physical Therapy Treatment Patient Details Name: Kathleen Peters MRN: [id number] DOB: [birth year] Today's Date: 05/31/2021   History of Present Illness Pt s/p R TKR and with hx of Graves disease    PT Comments    Pt s/p R TKR and presents with decreased R LE strength/ROM and post op pain limiting functional mobility.  Pt should progress to dc home with family assist and HHPT follow up.   Recommendations for follow up therapy are one component of a multi-disciplinary discharge planning process, led by the attending physician.  Recommendations may be updated based on patient status, additional functional criteria and insurance authorization.  Follow Up Recommendations  Home health PT;Follow surgeon's recommendation for DC plan and follow-up therapies     Equipment Recommendations  Rolling walker with 5" wheels;3in1 (PT)    Recommendations for Other Services       Precautions / Restrictions Precautions Precautions: Fall;Knee Required Braces or Orthoses: Knee Immobilizer - Right Knee Immobilizer - Right: Discontinue once straight leg raise with < 10 degree lag Restrictions Weight Bearing Restrictions: No RLE Weight Bearing: Weight bearing as tolerated     Mobility  Bed Mobility Overal bed mobility: Needs Assistance Bed Mobility: Supine to Sit     Supine to sit: Min assist     General bed mobility comments: cues for sequence and use of L LE to self assist.  Physical assist to manage R LE    Transfers Overall transfer level: Needs assistance Equipment used: Rolling walker (2 wheeled) Transfers: Sit to/from Stand Sit to Stand: Min assist         General transfer comment: cues for LE management and use of UEs to self assist  Ambulation/Gait Ambulation/Gait assistance: Min assist Gait Distance (Feet): 75 Feet Assistive device: Rolling walker (2 wheeled) Gait Pattern/deviations: Step-to pattern;Decreased step length - right;Decreased step length - left;Shuffle;Trunk  flexed Gait velocity: decr   General Gait Details: Increased time with cues for posture, position from RW and sequence   Stairs             Wheelchair Mobility    Modified Rankin (Stroke Patients Only)       Balance Overall balance assessment: Needs assistance Sitting-balance support: No upper extremity supported;Feet supported Sitting balance-Leahy Scale: Good     Standing balance support: Bilateral upper extremity supported Standing balance-Leahy Scale: Poor                              Cognition Arousal/Alertness: Awake/alert Behavior During Therapy: WFL for tasks assessed/performed Overall Cognitive Status: Within Functional Limits for tasks assessed                                        Exercises Total Joint Exercises Ankle Circles/Pumps: AROM;Both;15 reps;Supine Quad Sets: AROM;Both;10 reps;Supine Heel Slides: AAROM;Right;15 reps;Supine Straight Leg Raises: AAROM;Right;15 reps;Supine    General Comments        Pertinent Vitals/Pain Pain Assessment: 0-10 Pain Score: 6  Pain Location: R knee Pain Descriptors / Indicators: Aching;Sore Pain Intervention(s): Limited activity within patient's tolerance;Monitored during session;Premedicated before session;Ice applied    Home Living Family/patient expects to be discharged to:: Private residence Living Arrangements: Spouse/significant other Available Help at Discharge: Family Type of Home: House Home Access: Stairs to enter Entrance Stairs-Rails: Right Home Layout: One level Home Equipment: [place] - single point  Prior Function Level of Independence: Independent          PT Goals (current goals can now be found in the care plan section) Acute Rehab PT Goals Patient Stated Goal: Regain iND PT Goal Formulation: With patient Time For Goal Achievement: 06/07/21 Potential to Achieve Goals: Good    Frequency    7X/week      PT Plan      Co-evaluation               AM-PAC PT "6 Clicks" Mobility   Outcome Measure  Help needed turning from your back to your side while in a flat bed without using bedrails?: A Little Help needed moving from lying on your back to sitting on the side of a flat bed without using bedrails?: A Little Help needed moving to and from a bed to a chair (including a wheelchair)?: A Little Help needed standing up from a chair using your arms (e.g., wheelchair or bedside chair)?: A Little Help needed to walk in hospital room?: A Little Help needed climbing 3-5 steps with a railing? : A Lot 6 Click Score: 17    End of Session Equipment Utilized During Treatment: Gait belt;Right knee immobilizer Activity Tolerance: Patient tolerated treatment well Patient left: in chair;with call bell/phone within reach;with chair alarm set;with nursing/sitter in room Nurse Communication: Mobility status PT Visit Diagnosis: Difficulty in walking, not elsewhere classified (R26.2)     Time: 1110-1206 PT Time Calculation (min) (ACUTE ONLY): 56 min  Charges:  $Gait Training: 8-22 mins $Therapeutic Exercise: 8-22 mins                     [place] Pager [phone] Office [phone]    Kitty Cadavid 05/31/2021, 1:48 PM

## 2021-05-31 NOTE — Progress Notes (Signed)
Notified Dr. Rodell Perna, MD that despite patient having a discharge order to go home today, that patient did not pass physical therapy due to pain control issues. Despite patient being pre-medicated for pain, patient continued to have pain issues when working with PT. Also, informed that patient said she takes Atenolol at night time and that BP med was moved to night time. Also, informed that patient's heart rate was in the 50's this AM. Will continue to monitor patient.

## 2021-05-31 NOTE — Progress Notes (Signed)
Subjective: 1 Day Post-Op Procedure(s) (LRB): RIGHT TOTAL KNEE ARTHROPLASTY (Right) Patient reports pain as moderate.  Acute on chronic blood loss anemia (anemia pre-op).  Working with PT.  Objective: Vital signs in last 24 hours: Temp:  [97.5 F (36.4 C)-98.7 F (37.1 C)] 97.9 F (36.6 C) (10/08 1025) Pulse Rate:  [48-75] 61 (10/08 1025) Resp:  [10-22] 17 (10/08 0530) BP: (99-132)/(54-83) 122/62 (10/08 1025) SpO2:  [94 %-100 %] 100 % (10/08 1025) Weight:  [62.6 kg] 62.6 kg (10/07 1155)  Intake/Output from previous day: 10/07 0701 - 10/08 0700 In: 2960.7 [P.O.:960; I.V.:1800.7; IV Piggyback:200] Out: 1600 [Urine:1550; Blood:50] Intake/Output this shift: Total I/O In: -  Out: 500 [Urine:500]  Recent Labs    05/31/21 0327  HGB 9.5*   Recent Labs    05/31/21 0327  WBC 7.3  RBC 3.27*  HCT 30.3*  PLT 234   Recent Labs    05/31/21 0327  NA 137  K 4.6  CL 105  CO2 25  BUN 12  CREATININE 1.07*  GLUCOSE 128*  CALCIUM 8.2*   No results for input(s): LABPT, INR in the last 72 hours.  Sensation intact distally Intact pulses distally Dorsiflexion/Plantar flexion intact Incision: dressing C/D/I Compartment soft   Assessment/Plan: 1 Day Post-Op Procedure(s) (LRB): RIGHT TOTAL KNEE ARTHROPLASTY (Right) Up with therapy Discharge home with home health      Mcarthur Rossetti 05/31/2021, 11:35 AM

## 2021-06-01 DIAGNOSIS — M1711 Unilateral primary osteoarthritis, right knee: Secondary | ICD-10-CM | POA: Diagnosis not present

## 2021-06-01 DIAGNOSIS — Z85828 Personal history of other malignant neoplasm of skin: Secondary | ICD-10-CM | POA: Diagnosis not present

## 2021-06-01 DIAGNOSIS — Z79899 Other long term (current) drug therapy: Secondary | ICD-10-CM | POA: Diagnosis not present

## 2021-06-01 DIAGNOSIS — M21061 Valgus deformity, not elsewhere classified, right knee: Secondary | ICD-10-CM | POA: Diagnosis not present

## 2021-06-01 NOTE — Anesthesia Postprocedure Evaluation (Signed)
Anesthesia Post Note  Patient: Kathleen Peters  Procedure(s) Performed: RIGHT TOTAL KNEE ARTHROPLASTY (Right: Knee)     Patient location during evaluation: PACU Anesthesia Type: Spinal Level of consciousness: oriented and awake and alert Pain management: pain level controlled Vital Signs Assessment: post-procedure vital signs reviewed and stable Respiratory status: spontaneous breathing, respiratory function stable and patient connected to nasal cannula oxygen Cardiovascular status: blood pressure returned to baseline and stable Postop Assessment: no headache, no backache and no apparent nausea or vomiting Anesthetic complications: no   No notable events documented.  Last Vitals:  Vitals:   05/31/21 2045 06/01/21 0606  BP: 138/69 139/64  Pulse: 67 63  Resp: 17 15  Temp: 36.8 C 37.6 C  SpO2: 100% 93%    Last Pain:  Vitals:   06/01/21 0606  TempSrc: Oral  PainSc:                  Arriana Lohmann S

## 2021-06-01 NOTE — Progress Notes (Signed)
The patient is alert and oriented and has been seen by her physician. The orders for discharge were written. IV has been removed. Went over discharge instructions with patient and family. She is being discharged via wheelchair with all of her belongings.  

## 2021-06-01 NOTE — TOC Transition Note (Signed)
Transition of Care [facility]) - CM/SW Discharge Note   Patient Details  Name: Kathleen Peters MRN: [id number] Date of Birth: [birth year]  Transition of Care [facility] CM/SW Contact:  Ross Ludwig, LCSW Phone Number: 06/01/2021, 10:27 AM   Clinical Narrative:     Patient will be going home with home health through [place].  CSW signing off please reconsult with any other social work needs, home health agency has been notified of planned discharge.  Patient already had DME delivered.   Final next level of care: [place] Barriers to Discharge: Barriers Resolved   Patient Goals and CMS Choice Patient states their goals for this hospitalization and ongoing recovery are:: To return back home. CMS Medicare.gov Compare Post Acute Care list provided to:: Patient Choice offered to / list presented to : Patient  Discharge Placement                       Discharge Plan and Services In-house Referral: Clinical Social Work   Post Acute Care Choice: Home Health, Durable Medical Equipment          DME Arranged: Gilford Rile rolling, 3-N-1 DME Agency: [employer] Date DME Agency Contacted: 05/30/21 Time DME Agency Contacted: 7 Representative spoke with at DME Agency: [place]: PT [place]: [place] Date [place]: 05/30/21 Time [place]: 86 Representative spoke with at [place]: [place] ([place]) Interventions     Readmission Risk Interventions No flowsheet data found.

## 2021-06-01 NOTE — Progress Notes (Signed)
   Subjective: 2 Days Post-Op Procedure(s) (LRB): RIGHT TOTAL KNEE ARTHROPLASTY (Right) Patient reports pain as moderate.    Objective: Vital signs in last 24 hours: Temp:  [97.6 F (36.4 C)-99.6 F (37.6 C)] 99.6 F (37.6 C) (10/09 0606) Pulse Rate:  [54-67] 63 (10/09 0606) Resp:  [15-19] 15 (10/09 0606) BP: (122-139)/(51-69) 139/64 (10/09 0606) SpO2:  [93 %-100 %] 93 % (10/09 0606)  Intake/Output from previous day: 10/08 0701 - 10/09 0700 In: 1650.3 [P.O.:900; I.V.:750.3] Out: 1800 [Urine:1800] Intake/Output this shift: Total I/O In: -  Out: 700 [Urine:700]  Recent Labs    05/31/21 0327  HGB 9.5*   Recent Labs    05/31/21 0327  WBC 7.3  RBC 3.27*  HCT 30.3*  PLT 234   Recent Labs    05/31/21 0327  NA 137  K 4.6  CL 105  CO2 25  BUN 12  CREATININE 1.07*  GLUCOSE 128*  CALCIUM 8.2*   No results for input(s): LABPT, INR in the last 72 hours.  Neurologically intact No results found.  Assessment/Plan: 2 Days Post-Op Procedure(s) (LRB): RIGHT TOTAL KNEE ARTHROPLASTY (Right) Up with therapy, discharge home after PT with HHPT  Kathleen Peters 06/01/2021, 8:28 AM

## 2021-06-01 NOTE — Progress Notes (Signed)
Physical Therapy Treatment Patient Details Name: Kathleen Peters MRN: [id number] DOB: 1950/09/05 Today's Date: 06/01/2021   History of Present Illness Pt s/p R TKR and with hx of Graves disease    PT Comments    Pt continues cooperative and with marked improvement in pain control, activity tolerance and quality of movement.  Pt up to ambulate in hall, negotiated stairs, reviewed don/doff KI, and performed therex program with written instruction provided.  Pt eager for dc home.   Recommendations for follow up therapy are one component of a multi-disciplinary discharge planning process, led by the attending physician.  Recommendations may be updated based on patient status, additional functional criteria and insurance authorization.  Follow Up Recommendations  Home health PT;Follow surgeon's recommendation for DC plan and follow-up therapies     Equipment Recommendations  Rolling walker with 5" wheels;3in1 (PT)    Recommendations for Other Services       Precautions / Restrictions Precautions Precautions: Fall;Knee Required Braces or Orthoses: Knee Immobilizer - Right Knee Immobilizer - Right: Discontinue once straight leg raise with < 10 degree lag Restrictions Weight Bearing Restrictions: No RLE Weight Bearing: Weight bearing as tolerated     Mobility  Bed Mobility Overal bed mobility: Needs Assistance Bed Mobility: Supine to Sit     Supine to sit: Supervision     General bed mobility comments: INcreased time with cues for sequence and use of L LE to self assist.    Transfers Overall transfer level: Needs assistance Equipment used: Rolling walker (2 wheeled) Transfers: Sit to/from Stand Sit to Stand: Min guard;Supervision         General transfer comment: cues for LE management and use of UEs to self assist  Ambulation/Gait Ambulation/Gait assistance: Min guard;Supervision Gait Distance (Feet): 80 Feet Assistive device: Rolling walker (2 wheeled) Gait  Pattern/deviations: Step-to pattern;Decreased step length - right;Decreased step length - left;Shuffle;Trunk flexed Gait velocity: decr   General Gait Details: Increased time with cues for posture, position from RW and sequence   Stairs Stairs: Yes Stairs assistance: Min assist Stair Management: One rail Left;Step to pattern;With cane;Forwards Number of Stairs: 4 General stair comments: 2 steps twice with cane and rail; cues for sequence and cane/foot placement.   Wheelchair Mobility    Modified Rankin (Stroke Patients Only)       Balance Overall balance assessment: Needs assistance Sitting-balance support: No upper extremity supported;Feet supported Sitting balance-Leahy Scale: Good     Standing balance support: No upper extremity supported Standing balance-Leahy Scale: Fair                              Cognition Arousal/Alertness: Awake/alert Behavior During Therapy: WFL for tasks assessed/performed Overall Cognitive Status: Within Functional Limits for tasks assessed                                        Exercises Total Joint Exercises Ankle Circles/Pumps: AROM;Both;15 reps;Supine Quad Sets: AROM;Both;10 reps;Supine Heel Slides: AAROM;Right;15 reps;Supine Straight Leg Raises: AAROM;Right;15 reps;Supine    General Comments        Pertinent Vitals/Pain Pain Assessment: 0-10 Pain Score: 5  Pain Location: R knee Pain Descriptors / Indicators: Aching;Sore Pain Intervention(s): Limited activity within patient's tolerance;Monitored during session;Premedicated before session    Home Living  Prior Function            PT Goals (current goals can now be found in the care plan section) Acute Rehab PT Goals Patient Stated Goal: Regain iND PT Goal Formulation: With patient Time For Goal Achievement: 06/07/21 Potential to Achieve Goals: Good Progress towards PT goals: Progressing toward goals     Frequency    7X/week      PT Plan Current plan remains appropriate    Co-evaluation              AM-PAC PT "6 Clicks" Mobility   Outcome Measure  Help needed turning from your back to your side while in a flat bed without using bedrails?: A Little Help needed moving from lying on your back to sitting on the side of a flat bed without using bedrails?: A Little Help needed moving to and from a bed to a chair (including a wheelchair)?: A Little Help needed standing up from a chair using your arms (e.g., wheelchair or bedside chair)?: A Little Help needed to walk in hospital room?: A Little Help needed climbing 3-5 steps with a railing? : A Little 6 Click Score: 18    End of Session Equipment Utilized During Treatment: Gait belt;Right knee immobilizer Activity Tolerance: Patient tolerated treatment well Patient left: with call bell/phone within reach;in chair;with chair alarm set Nurse Communication: Mobility status PT Visit Diagnosis: Difficulty in walking, not elsewhere classified (R26.2)     Time: 9794-8016 PT Time Calculation (min) (ACUTE ONLY): 48 min  Charges:  $Gait Training: 8-22 mins $Therapeutic Exercise: 8-22 mins $Therapeutic Activity: 8-22 mins                     [place] Pager [phone] Office [phone]    Milanna Kozlov 06/01/2021, 10:40 AM

## 2021-06-01 NOTE — Plan of Care (Signed)
  Problem: Education: Goal: Knowledge of General Education information will improve Description Including pain rating scale, medication(s)/side effects and non-pharmacologic comfort measures Outcome: Progressing   Problem: Health Behavior/Discharge Planning: Goal: Ability to manage health-related needs will improve Outcome: Progressing   

## 2021-06-02 ENCOUNTER — Encounter (HOSPITAL_COMMUNITY): Payer: Self-pay | Admitting: Orthopaedic Surgery

## 2021-06-02 DIAGNOSIS — I10 Essential (primary) hypertension: Secondary | ICD-10-CM | POA: Diagnosis not present

## 2021-06-02 DIAGNOSIS — Z471 Aftercare following joint replacement surgery: Secondary | ICD-10-CM | POA: Diagnosis not present

## 2021-06-02 DIAGNOSIS — Z96651 Presence of right artificial knee joint: Secondary | ICD-10-CM | POA: Diagnosis not present

## 2021-06-02 DIAGNOSIS — Z7982 Long term (current) use of aspirin: Secondary | ICD-10-CM | POA: Diagnosis not present

## 2021-06-02 DIAGNOSIS — M1990 Unspecified osteoarthritis, unspecified site: Secondary | ICD-10-CM | POA: Diagnosis not present

## 2021-06-02 DIAGNOSIS — G4700 Insomnia, unspecified: Secondary | ICD-10-CM | POA: Diagnosis not present

## 2021-06-02 DIAGNOSIS — E0500 Thyrotoxicosis with diffuse goiter without thyrotoxic crisis or storm: Secondary | ICD-10-CM | POA: Diagnosis not present

## 2021-06-02 DIAGNOSIS — Z85828 Personal history of other malignant neoplasm of skin: Secondary | ICD-10-CM | POA: Diagnosis not present

## 2021-06-02 DIAGNOSIS — G43909 Migraine, unspecified, not intractable, without status migrainosus: Secondary | ICD-10-CM | POA: Diagnosis not present

## 2021-06-04 DIAGNOSIS — G43909 Migraine, unspecified, not intractable, without status migrainosus: Secondary | ICD-10-CM | POA: Diagnosis not present

## 2021-06-04 DIAGNOSIS — Z85828 Personal history of other malignant neoplasm of skin: Secondary | ICD-10-CM | POA: Diagnosis not present

## 2021-06-04 DIAGNOSIS — G4700 Insomnia, unspecified: Secondary | ICD-10-CM | POA: Diagnosis not present

## 2021-06-04 DIAGNOSIS — Z471 Aftercare following joint replacement surgery: Secondary | ICD-10-CM | POA: Diagnosis not present

## 2021-06-04 DIAGNOSIS — E0500 Thyrotoxicosis with diffuse goiter without thyrotoxic crisis or storm: Secondary | ICD-10-CM | POA: Diagnosis not present

## 2021-06-04 DIAGNOSIS — I10 Essential (primary) hypertension: Secondary | ICD-10-CM | POA: Diagnosis not present

## 2021-06-04 DIAGNOSIS — M1990 Unspecified osteoarthritis, unspecified site: Secondary | ICD-10-CM | POA: Diagnosis not present

## 2021-06-04 DIAGNOSIS — Z7982 Long term (current) use of aspirin: Secondary | ICD-10-CM | POA: Diagnosis not present

## 2021-06-04 DIAGNOSIS — Z96651 Presence of right artificial knee joint: Secondary | ICD-10-CM | POA: Diagnosis not present

## 2021-06-06 ENCOUNTER — Other Ambulatory Visit: Payer: Self-pay | Admitting: *Deleted

## 2021-06-06 ENCOUNTER — Telehealth: Payer: Self-pay | Admitting: *Deleted

## 2021-06-06 ENCOUNTER — Other Ambulatory Visit: Payer: Self-pay | Admitting: Orthopaedic Surgery

## 2021-06-06 DIAGNOSIS — Z85828 Personal history of other malignant neoplasm of skin: Secondary | ICD-10-CM | POA: Diagnosis not present

## 2021-06-06 DIAGNOSIS — Z96651 Presence of right artificial knee joint: Secondary | ICD-10-CM | POA: Diagnosis not present

## 2021-06-06 DIAGNOSIS — G43909 Migraine, unspecified, not intractable, without status migrainosus: Secondary | ICD-10-CM | POA: Diagnosis not present

## 2021-06-06 DIAGNOSIS — M1711 Unilateral primary osteoarthritis, right knee: Secondary | ICD-10-CM

## 2021-06-06 DIAGNOSIS — Z471 Aftercare following joint replacement surgery: Secondary | ICD-10-CM | POA: Diagnosis not present

## 2021-06-06 DIAGNOSIS — G4700 Insomnia, unspecified: Secondary | ICD-10-CM | POA: Diagnosis not present

## 2021-06-06 DIAGNOSIS — I10 Essential (primary) hypertension: Secondary | ICD-10-CM | POA: Diagnosis not present

## 2021-06-06 DIAGNOSIS — E0500 Thyrotoxicosis with diffuse goiter without thyrotoxic crisis or storm: Secondary | ICD-10-CM | POA: Diagnosis not present

## 2021-06-06 DIAGNOSIS — Z7982 Long term (current) use of aspirin: Secondary | ICD-10-CM | POA: Diagnosis not present

## 2021-06-06 DIAGNOSIS — M1990 Unspecified osteoarthritis, unspecified site: Secondary | ICD-10-CM | POA: Diagnosis not present

## 2021-06-06 MED ORDER — OXYCODONE HCL 5 MG PO TABS: 5.0000 mg | ORAL_TABLET | Freq: Four times a day (QID) | ORAL | 0 refills | Status: DC | PRN

## 2021-06-06 NOTE — Telephone Encounter (Signed)
7 day Ortho bundle call. Patient requesting refill of pain medication prior to the weekend. Thanks.

## 2021-06-06 NOTE — Care Plan (Signed)
Late entry for Monday, 06/02/21 - Patient discharged home over the weekend. D/C call to her and she states therapy has been out today. Also received call from [facility] stating they have attempted to contact patient and she states another [facility] agency has started care. Patient confirmed [facility] has started care with her today. CM checked on this and [facility] was apparently given referral while patient in the hospital. SW on call this weekend confirmed that he did not look at CM's note and did make referral to [facility], then canceled it, but they must not have done that. Patient comfortable with [facility] at this point. Canceled referral to [facility]. Encouragement given to patient, who had a lot of questions related to her care and progress.

## 2021-06-06 NOTE — Telephone Encounter (Signed)
Ortho bundle D/C call completed on 06/02/21. Late entry.

## 2021-06-09 DIAGNOSIS — M1990 Unspecified osteoarthritis, unspecified site: Secondary | ICD-10-CM | POA: Diagnosis not present

## 2021-06-09 DIAGNOSIS — Z85828 Personal history of other malignant neoplasm of skin: Secondary | ICD-10-CM | POA: Diagnosis not present

## 2021-06-09 DIAGNOSIS — G4700 Insomnia, unspecified: Secondary | ICD-10-CM | POA: Diagnosis not present

## 2021-06-09 DIAGNOSIS — I10 Essential (primary) hypertension: Secondary | ICD-10-CM | POA: Diagnosis not present

## 2021-06-09 DIAGNOSIS — G43909 Migraine, unspecified, not intractable, without status migrainosus: Secondary | ICD-10-CM | POA: Diagnosis not present

## 2021-06-09 DIAGNOSIS — Z7982 Long term (current) use of aspirin: Secondary | ICD-10-CM | POA: Diagnosis not present

## 2021-06-09 DIAGNOSIS — E0500 Thyrotoxicosis with diffuse goiter without thyrotoxic crisis or storm: Secondary | ICD-10-CM | POA: Diagnosis not present

## 2021-06-09 DIAGNOSIS — Z471 Aftercare following joint replacement surgery: Secondary | ICD-10-CM | POA: Diagnosis not present

## 2021-06-09 DIAGNOSIS — Z96651 Presence of right artificial knee joint: Secondary | ICD-10-CM | POA: Diagnosis not present

## 2021-06-10 ENCOUNTER — Telehealth: Payer: Self-pay | Admitting: *Deleted

## 2021-06-10 DIAGNOSIS — Z7982 Long term (current) use of aspirin: Secondary | ICD-10-CM | POA: Diagnosis not present

## 2021-06-10 DIAGNOSIS — M1990 Unspecified osteoarthritis, unspecified site: Secondary | ICD-10-CM | POA: Diagnosis not present

## 2021-06-10 DIAGNOSIS — Z85828 Personal history of other malignant neoplasm of skin: Secondary | ICD-10-CM | POA: Diagnosis not present

## 2021-06-10 DIAGNOSIS — E0500 Thyrotoxicosis with diffuse goiter without thyrotoxic crisis or storm: Secondary | ICD-10-CM | POA: Diagnosis not present

## 2021-06-10 DIAGNOSIS — Z96651 Presence of right artificial knee joint: Secondary | ICD-10-CM | POA: Diagnosis not present

## 2021-06-10 DIAGNOSIS — G43909 Migraine, unspecified, not intractable, without status migrainosus: Secondary | ICD-10-CM | POA: Diagnosis not present

## 2021-06-10 DIAGNOSIS — I10 Essential (primary) hypertension: Secondary | ICD-10-CM | POA: Diagnosis not present

## 2021-06-10 DIAGNOSIS — Z471 Aftercare following joint replacement surgery: Secondary | ICD-10-CM | POA: Diagnosis not present

## 2021-06-10 DIAGNOSIS — G4700 Insomnia, unspecified: Secondary | ICD-10-CM | POA: Diagnosis not present

## 2021-06-10 NOTE — Telephone Encounter (Signed)
Ortho bundle call from patient; discussed pain and swelling. Appt this Thursday with Dr. Ninfa Linden. Encouragement provided.

## 2021-06-11 DIAGNOSIS — Z7982 Long term (current) use of aspirin: Secondary | ICD-10-CM | POA: Diagnosis not present

## 2021-06-11 DIAGNOSIS — Z85828 Personal history of other malignant neoplasm of skin: Secondary | ICD-10-CM | POA: Diagnosis not present

## 2021-06-11 DIAGNOSIS — G4700 Insomnia, unspecified: Secondary | ICD-10-CM | POA: Diagnosis not present

## 2021-06-11 DIAGNOSIS — Z471 Aftercare following joint replacement surgery: Secondary | ICD-10-CM | POA: Diagnosis not present

## 2021-06-11 DIAGNOSIS — E0500 Thyrotoxicosis with diffuse goiter without thyrotoxic crisis or storm: Secondary | ICD-10-CM | POA: Diagnosis not present

## 2021-06-11 DIAGNOSIS — G43909 Migraine, unspecified, not intractable, without status migrainosus: Secondary | ICD-10-CM | POA: Diagnosis not present

## 2021-06-11 DIAGNOSIS — I10 Essential (primary) hypertension: Secondary | ICD-10-CM | POA: Diagnosis not present

## 2021-06-11 DIAGNOSIS — M1990 Unspecified osteoarthritis, unspecified site: Secondary | ICD-10-CM | POA: Diagnosis not present

## 2021-06-11 DIAGNOSIS — Z96651 Presence of right artificial knee joint: Secondary | ICD-10-CM | POA: Diagnosis not present

## 2021-06-12 ENCOUNTER — Encounter: Payer: Self-pay | Admitting: Orthopaedic Surgery

## 2021-06-12 ENCOUNTER — Encounter: Payer: Self-pay | Admitting: Rehabilitative and Restorative Service Providers"

## 2021-06-12 ENCOUNTER — Other Ambulatory Visit: Payer: Self-pay

## 2021-06-12 ENCOUNTER — Telehealth: Payer: Self-pay | Admitting: *Deleted

## 2021-06-12 ENCOUNTER — Ambulatory Visit (INDEPENDENT_AMBULATORY_CARE_PROVIDER_SITE_OTHER): Payer: Medicare HMO | Admitting: Orthopaedic Surgery

## 2021-06-12 ENCOUNTER — Ambulatory Visit: Payer: Medicare HMO | Admitting: Rehabilitative and Restorative Service Providers"

## 2021-06-12 DIAGNOSIS — R600 Localized edema: Secondary | ICD-10-CM

## 2021-06-12 DIAGNOSIS — M6281 Muscle weakness (generalized): Secondary | ICD-10-CM | POA: Diagnosis not present

## 2021-06-12 DIAGNOSIS — G8929 Other chronic pain: Secondary | ICD-10-CM | POA: Diagnosis not present

## 2021-06-12 DIAGNOSIS — R262 Difficulty in walking, not elsewhere classified: Secondary | ICD-10-CM

## 2021-06-12 DIAGNOSIS — Z96651 Presence of right artificial knee joint: Secondary | ICD-10-CM

## 2021-06-12 DIAGNOSIS — M25561 Pain in right knee: Secondary | ICD-10-CM | POA: Diagnosis not present

## 2021-06-12 MED ORDER — OXYCODONE HCL 5 MG PO TABS: 5.0000 mg | ORAL_TABLET | Freq: Four times a day (QID) | ORAL | 0 refills | Status: DC | PRN

## 2021-06-12 NOTE — Patient Instructions (Signed)
Access Code: [email or web address] URL: [email or web address] Date: 06/12/2021 Prepared by: Scot Jun  Exercises Supine Heel Slide - 3 x daily - 7 x weekly - 2 sets - 10 reps - 2 hold Supine Heel Slide with Strap (Mirrored) - 3 x daily - 7 x weekly - 2 sets - 10 reps - 5-10 hold Seated Long Arc Quad - 3 x daily - 7 x weekly - 2 sets - 10 reps - 2 hold Supine Quadricep Sets - 3 x daily - 7 x weekly - 2 sets - 10 reps - 5 hold Supine Knee Extension Mobilization with Weight - 1 x daily - 7 x weekly - 1 sets - 4-5 reps - to tolerance up to 15 mins hold

## 2021-06-12 NOTE — Telephone Encounter (Signed)
Ortho bundle 14 day in office visit completed. 

## 2021-06-12 NOTE — Progress Notes (Signed)
The patient is 2 weeks tomorrow status post a right total knee arthroplasty.  She reports that she is doing well.  She is ambulate with a cane.  She does need a refill of her pain medications which I think this is reasonable.  She says the most they have flexed her back to is 85 degrees.  She is scheduled to go to outpatient physical therapy this afternoon.  There is moderate swelling in her right operative knee.  There is also swelling in her foot and ankle.  She does report she has been wearing her TED hose.  Her calf is soft though.  She has been taking her twice daily aspirin.  The staples are removed and Steri-Strips applied to the incision.  Overall things look good.  I can only flex her to about 70 degrees today in the office.  She understands that getting her knee bending is the most important thing and that outpatient therapy will push her hard.  I will send in a refill of her oxycodone.  All questions and concerns were answered addressed.  I like to reevaluate her in 4 weeks with a repeat exam but no x-rays are needed.

## 2021-06-12 NOTE — Therapy (Signed)
[facility] Physical Therapy [address] [place], [place], [address] Phone: [phone]   Fax:  [phone]  Physical Therapy Evaluation  Patient Details  Name: Kathleen Peters MRN: [id number] Date of Birth: Oct 29, 1950 Referring Provider (PT): Mcarthur Rossetti, MD   Encounter Date: 06/12/2021   PT End of Session - 06/12/21 1456     Visit Number 1    Number of Visits 20    Date for PT Re-Evaluation 08/21/21    Authorization Type [employer] $10 copay    Progress Note Due on Visit 10    PT Start Time 8786    PT Stop Time 7672    PT Time Calculation (min) 40 min    Activity Tolerance Patient tolerated treatment well    Behavior During Therapy [facility] for tasks assessed/performed             Past Medical History:  Diagnosis Date   Arthritis    Back pain    Cancer ([place])    hx of skin cancer   Graves disease    Hyperthyroidism    Insomnia    Migraines     Past Surgical History:  Procedure Laterality Date   ABDOMINAL HYSTERECTOMY     ELBOW FRACTURE SURGERY     LAPAROSCOPIC APPENDECTOMY N/A 01/29/2021   Procedure: APPENDECTOMY LAPAROSCOPIC;  Surgeon: Clovis Riley, MD;  Location: [facility];  Service: General;  Laterality: N/A;   TONSILLECTOMY     TOTAL KNEE ARTHROPLASTY Right 05/30/2021   Procedure: RIGHT TOTAL KNEE ARTHROPLASTY;  Surgeon: Mcarthur Rossetti, MD;  Location: [facility];  Service: Orthopedics;  Laterality: Right;   TYMPANOSTOMY TUBE PLACEMENT      There were no vitals filed for this visit.    Subjective Assessment - 06/12/21 1516     Subjective Pt. came to clinic s/p recent Rt TKA on 05/30/2021.  Saw MD today.  Pt. stated knee feels like its pulling and swollen.  Most pain on inside of knee but also on outside as well.  General soreness reported.  Steristrips still in place.  Using cane but also doing walking without it.  Home PT was performed.    Limitations Sitting;Lifting;Standing;Walking;House hold activities    Patient Stated  Goals Reduce pain, walk normally    Currently in Pain? Yes    Pain Score 5    pain at worst 10/10   Pain Location Knee    Pain Orientation Right    Pain Descriptors / Indicators Aching;Tightness;Throbbing;Sore    Pain Type Surgical pain    Pain Onset 1 to 4 weeks ago   surgery   Pain Frequency Intermittent    Aggravating Factors  pain c ice machine (MD suggested alternating heat/ice per Pt), difficulty sleeping (usually in recliner), bending end range pain    Pain Relieving Factors movement helps    Effect of Pain on Daily Activities Limited in standing                [facility] PT Assessment - 06/12/21 0001       Assessment   Medical Diagnosis M17.11 (ICD-10-CM) - Unilateral primary osteoarthritis, right knee  Z96.651 (ICD-10-CM) - Status post total right knee replacement    Referring Provider (PT) Mcarthur Rossetti, MD    Onset Date/Surgical Date 05/30/21    Hand Dominance Right      Precautions   Precautions None      Balance Screen   Has the patient fallen in the past 6 months No  Has the patient had a decrease in activity level because of a fear of falling?  No    Is the patient reluctant to leave their home because of a fear of falling?  No      Home Environment   Living Environment Private residence    Additional Comments 3 stairs to enter house c rail on [place] going up      Prior Function   Level of [place] (last played in May 2022), sitting recreational, walking      Cognition   Overall Cognitive Status Within Functional Limits for tasks assessed      Observation/Other Assessments   Observations localized edema Rt knee    Focus on Therapeutic Outcomes (FOTO)  intake : 38%, predicted 72%      Functional Tests   Functional tests Single leg stance;Sit to Stand      Single Leg Stance   Comments Rt SLS < 3 seconds, Lt 10 seconds      Sit to Stand   Comments 18 inch chair s UE c Rt leg extended, dependent on Lt leg       ROM / Strength   AROM / PROM / Strength Strength;PROM;AROM      AROM   AROM Assessment Site Knee    Right/Left Knee Right;Left    Right Knee Extension -15   in LAQ   Right Knee Flexion 66   in supine heel slide   Left Knee Flexion 130      PROM   PROM Assessment Site Knee    Right/Left Knee Left;Right    Right Knee Flexion 75   after manual intervention in sitting     Strength   Strength Assessment Site Hip;Knee;Ankle    Right/Left Hip Right;Left    Right Hip Flexion 4/5    Left Hip Flexion 5/5    Right/Left Knee Right;Left    Right Knee Flexion 4/5    Right Knee Extension 3+/5   12.8, 8 lbs   Left Knee Flexion 5/5    Left Knee Extension 5/5   39, 32 lbs   Right/Left Ankle Left;Right    Right Ankle Dorsiflexion 5/5    Left Ankle Dorsiflexion 5/5      Palpation   Patella mobility limited in all directions      Ambulation/Gait   Assistive device Straight cane    Gait Pattern Antalgic;Decreased weight shift to right;Decreased stride length;Decreased stance time - right;Decreased step length - left                        Objective measurements completed on examination: See above findings.       [facility] Adult PT Treatment/Exercise - 06/12/21 0001       Exercises   Exercises Other Exercises;Knee/Hip    Other Exercises  HEP instruction/performance c cues for techniques, handout provided.  Trial set performed of each for comprehension and symptom assessment.  Consisting of supine quad set, active/passive heel slide, supine heel prop, seated LAQ      Manual Therapy   Manual therapy comments seated Rt knee flexion mobilization c movement c IR/distraction c contralateral leg movement opposite                     PT Education - 06/12/21 1456     Education Details HEP, POC    Person(s) Educated Patient    Methods Explanation;Demonstration;Verbal cues;Handout    Comprehension  Returned demonstration;Verbalized understanding              PT  Short Term Goals - 06/12/21 1457       PT SHORT TERM GOAL #1   Title Patient will demonstrate independent use of home exercise program to maintain progress from in clinic treatments.    Time 3    Period Weeks    Status New    Target Date 07/03/21               PT Long Term Goals - 06/12/21 1457       PT LONG TERM GOAL #1   Title Patient will demonstrate/report pain at worst less than or equal to 2/10 to facilitate minimal limitation in daily activity secondary to pain symptoms.    Time 10    Period Weeks    Status New    Target Date 08/21/21      PT LONG TERM GOAL #2   Title Patient will demonstrate independent use of home exercise program to facilitate ability to maintain/progress functional gains from skilled physical therapy services.    Time 10    Period Weeks    Status New    Target Date 08/21/21      PT LONG TERM GOAL #3   Title Pt. will demonstrate FOTO outcome > or = 72% to indicated reduced disability due to condition.    Time 10    Period Weeks    Status New    Target Date 08/21/21      PT LONG TERM GOAL #4   Title Patient will demonstrate Rt knee AROM 0-110 degrees to facilitate ability to perform transfers, sitting, ambulation, stair navigation s restriction due to mobility.    Time 10    Period Weeks    Status New    Target Date 08/21/21      PT LONG TERM GOAL #5   Title Patient will demonstrate Rt LE MMT 5/5 throughout to facilitate ability to perform usual standing, walking, stairs at PLOF s limitation due to symptoms.    Time 10    Period Weeks    Status New    Target Date 08/21/21      Additional Long Term Goals   Additional Long Term Goals Yes      PT LONG TERM GOAL #6   Title Pt. will demonstrate bilateral SLS > 15 seconds to facilitate stability in ambulation on even and uneven surfaces.    Time 10    Period Weeks    Status New    Target Date 08/21/21      PT LONG TERM GOAL #7   Title Pt. will demonstrate reciprocal gait pattern  up/down 3 stairs s handrail for household entry.    Time 10    Period Weeks    Status New    Target Date 08/21/21                    Plan - 06/12/21 1459     Clinical Impression Statement Patient is a 70 y.o. who comes to clinic with complaints of Rt knee pain s/p recent TKA with mobility, strength and movement coordination deficits that impair their ability to perform usual daily and recreational functional activities without increase difficulty/symptoms at this time.  Patient to benefit from skilled PT services to address impairments and limitations to improve to previous level of function without restriction secondary to condition.    Personal Factors and Comorbidities Comorbidity 2  Comorbidities History of skin cancer, Hyperthyroidism    Examination-Activity Limitations Sit;Sleep;Bed Mobility;Squat;Bend;Stairs;Stand;Transfers;Locomotion Level    Examination-Participation Restrictions Community Activity;Driving;Cleaning;Laundry;Meal Prep;Other   walking, golf   Stability/Clinical Decision Making Stable/Uncomplicated    Clinical Decision Making Low    Rehab Potential Good    PT Frequency 2x / week    PT Duration Other (comment)   10 weeks   PT Treatment/Interventions ADLs/Self Care Home Management;Cryotherapy;Electrical Stimulation;Iontophoresis 4mg /ml Dexamethasone;Moist Heat;Balance training;Therapeutic exercise;Therapeutic activities;Functional mobility training;Stair training;Ultrasound;Neuromuscular re-education;Patient/family education;Passive range of motion;Spinal Manipulations;Joint Manipulations;Dry needling;Taping;Vasopneumatic Device;Manual techniques    PT Next Visit Plan Manual for flexion gains, extension quad strengthening, vaso    PT Home Exercise Plan [id number]    Consulted and Agree with Plan of Care Patient             Patient will benefit from skilled therapeutic intervention in order to improve the following deficits and impairments:  Abnormal gait,  Decreased endurance, Hypomobility, Increased edema, Decreased activity tolerance, Decreased strength, Pain, Decreased balance, Decreased mobility, Difficulty walking, Impaired perceived functional ability, Improper body mechanics, Impaired flexibility, Decreased coordination, Decreased range of motion  Visit Diagnosis: Chronic pain of right knee  Muscle weakness (generalized)  Difficulty in walking, not elsewhere classified  Localized edema     Problem List Patient Active Problem List   Diagnosis Date Noted   Status post total right knee replacement 05/30/2021   Unilateral primary osteoarthritis, right knee 05/29/2021   Unilateral primary osteoarthritis, left knee 04/01/2021   Acute appendicitis 01/28/2021   Hyperthyroidism 04/14/2020   Heterozygous factor V Leiden mutation ([place]) 09/23/2015   Lupus anticoagulant positive 09/23/2015   Varicose veins of both lower extremities 09/23/2015   Thrombophlebitis of superficial veins of right lower extremity 04/24/2014   06/12/2021- 08/21/2021 POC Referring diagnosis? M17.11 Treatment diagnosis? (if different than referring diagnosis) M25.561 What was this (referring dx) caused by? [x]  Surgery []  Fall []  Ongoing issue []  Arthritis []  Other: ____________  Laterality: [x]  Rt []  Lt []  Both  Check all possible CPT codes:      [x]  97110 (Therapeutic Exercise)  []  92507 (SLP Treatment)  [x]  97112 (Neuro Re-ed)   []  92526 (Swallowing Treatment)   [x]  97116 (Gait Training)   []  [id number] (Cognitive Training, 1st 15 minutes) [x]  97140 (Manual Therapy)   []  97130 (Cognitive Training, each add'l 15 minutes)  [x]  97530 (Therapeutic Activities)  []  Other, List CPT Code ____________    [x]  19379 (Self Care)       [x]  All codes above (97110 - 97535)  []  97012 (Mechanical Traction)  []  97014 (E-stim Unattended)  [x]  97032 (E-stim manual)  []  97033 (Ionto)  []  97035 (Ultrasound)  []  97760 (Orthotic Fit) [x]  97750 (Physical Performance  Training) []  [id number] (Aquatic Therapy) []  97034 (Contrast Bath) []  [id number] (Paraffin) []  97597 (Wound Care 1st 20 sq cm) []  97598 (Wound Care each add'l 20 sq cm) [x]  97016 (Vasopneumatic Device) []  [id number] (Orthotic Training) []  [id number] (Prosthetic Training)  Scot Jun, PT, DPT, OCS, ATC 06/12/21  4:02 PM    [facility] [address] [place], [place], [address] Phone: [phone]   Fax:  [phone]  Name: Kathleen Peters MRN: [id number] Date of Birth: 08/16/1951

## 2021-06-16 ENCOUNTER — Telehealth: Payer: Self-pay | Admitting: *Deleted

## 2021-06-16 NOTE — Telephone Encounter (Signed)
Patient called today and states she is extremely concerned. She was seen in office on Thursday and then went to therapy. She reports she feels she is going backwards and is emotional at this point. She stated multiple times, "something is wrong". I tried to encourage her that pain was a normal process after a knee replacement, but she is insistent that her pain is abnormal. She reports pain medication is not helping at all and pain is severe with sitting/standing or walking. Reports every step as severe. She has a lot of inflammation she reports without any relief. She again states she feels like it is twisted and c/o severe medial pain. She asked about an X-ray, something for inflammation or someone seeing her as well. She is extremely frustrated at this point. Any recommendations?

## 2021-06-17 ENCOUNTER — Ambulatory Visit (INDEPENDENT_AMBULATORY_CARE_PROVIDER_SITE_OTHER): Payer: Medicare HMO | Admitting: Physical Therapy

## 2021-06-17 ENCOUNTER — Ambulatory Visit (INDEPENDENT_AMBULATORY_CARE_PROVIDER_SITE_OTHER): Payer: Medicare HMO

## 2021-06-17 ENCOUNTER — Ambulatory Visit (INDEPENDENT_AMBULATORY_CARE_PROVIDER_SITE_OTHER): Payer: Medicare HMO | Admitting: Orthopaedic Surgery

## 2021-06-17 ENCOUNTER — Encounter: Payer: Self-pay | Admitting: Physical Therapy

## 2021-06-17 ENCOUNTER — Other Ambulatory Visit: Payer: Self-pay

## 2021-06-17 DIAGNOSIS — R600 Localized edema: Secondary | ICD-10-CM

## 2021-06-17 DIAGNOSIS — M6281 Muscle weakness (generalized): Secondary | ICD-10-CM | POA: Diagnosis not present

## 2021-06-17 DIAGNOSIS — R262 Difficulty in walking, not elsewhere classified: Secondary | ICD-10-CM | POA: Diagnosis not present

## 2021-06-17 DIAGNOSIS — Z96651 Presence of right artificial knee joint: Secondary | ICD-10-CM

## 2021-06-17 DIAGNOSIS — M25561 Pain in right knee: Secondary | ICD-10-CM | POA: Diagnosis not present

## 2021-06-17 DIAGNOSIS — G8929 Other chronic pain: Secondary | ICD-10-CM

## 2021-06-17 MED ORDER — IBUPROFEN 800 MG PO TABS: 800.0000 mg | ORAL_TABLET | Freq: Three times a day (TID) | ORAL | 1 refills | Status: DC | PRN

## 2021-06-17 MED ORDER — METHYLPREDNISOLONE 4 MG PO TABS: ORAL_TABLET | ORAL | 0 refills | Status: DC

## 2021-06-17 NOTE — Progress Notes (Signed)
The patient is just past 2 weeks status post a right total knee arthroplasty.  We saw her last week and removed 2 staples in place Steri-Strips.  She feels like now she cannot bend her knee well and says something is just not right.  On exam she has full extension but her flexion is only to about 80 to 90 degrees.  The knee is ligamentously stable.  Her calf is soft.  Her foot is swollen.  2 views of the right knee show well-seated total knee arthroplasty with no complicating features.  I gave her reassurance that the knee replacement itself looks good.  She needs to really try to push through the pain.  We will try 800 mg ibuprofen as well as a steroid taper on top of her oxycodone to see if this will help decrease her pain centers to help her push through the pain and get her knee moving better.  Hopefully she will be able to push past this pain and can get her knee moving.  We will reevaluate her in 2 weeks.  No x-rays are needed.

## 2021-06-17 NOTE — Therapy (Addendum)
[facility] Physical Therapy [address] [place], [place], [address] Phone: [phone]   Fax:  [phone]  Physical Therapy Treatment  Patient Details  Name: Kathleen Peters MRN: [id number] Date of Birth: 10-31-1950 Referring Provider (PT): Mcarthur Rossetti, MD   Encounter Date: 06/17/2021   PT End of Session - 06/17/21 1145     Visit Number 2    Number of Visits 20    Date for PT Re-Evaluation 08/21/21    Authorization Type [employer] $10 copay    Progress Note Due on Visit 10    PT Start Time 1951    PT Stop Time 1240    PT Time Calculation (min) 55 min    Activity Tolerance Patient tolerated treatment well    Behavior During Therapy [facility] for tasks assessed/performed             Past Medical History:  Diagnosis Date   Arthritis    Back pain    Cancer ([place])    hx of skin cancer   Graves disease    Hyperthyroidism    Insomnia    Migraines     Past Surgical History:  Procedure Laterality Date   ABDOMINAL HYSTERECTOMY     ELBOW FRACTURE SURGERY     LAPAROSCOPIC APPENDECTOMY N/A 01/29/2021   Procedure: APPENDECTOMY LAPAROSCOPIC;  Surgeon: Clovis Riley, MD;  Location: [facility];  Service: General;  Laterality: N/A;   TONSILLECTOMY     TOTAL KNEE ARTHROPLASTY Right 05/30/2021   Procedure: RIGHT TOTAL KNEE ARTHROPLASTY;  Surgeon: Mcarthur Rossetti, MD;  Location: [facility];  Service: Orthopedics;  Laterality: Right;   TYMPANOSTOMY TUBE PLACEMENT      There were no vitals filed for this visit.   Subjective Assessment - 06/17/21 1145     Subjective She saw Dr. Ninfa Linden before PT today due to concerns that her gait & range were declined over last week.  He ordered steriod and can start with Tylenol or Ibuprofen    Limitations Sitting;Lifting;Standing;Walking;House hold activities    Patient Stated Goals Reduce pain, walk normally    Currently in Pain? Yes    Pain Score 3    range 3/10 up to 10/10   Pain Location Knee    Pain  Orientation Right;Medial;Lateral;Anterior    Pain Descriptors / Indicators Sore;Sharp;Throbbing    Pain Type Surgical pain    Pain Onset 1 to 4 weeks ago   surgery   Pain Frequency Constant    Aggravating Factors  moving / bending knee.    Pain Relieving Factors ice,                [facility] PT Assessment - 06/17/21 1145       Assessment   Medical Diagnosis M17.11 (ICD-10-CM) - Unilateral primary osteoarthritis, right knee  Z96.651 (ICD-10-CM) - Status post total right knee replacement    Referring Provider (PT) Mcarthur Rossetti, MD    Onset Date/Surgical Date 05/30/21    Hand Dominance Right      Precautions   Precautions None      [place] residence    Additional Comments 3 stairs to enter house c rail on [place] going up      Prior Function   Level of [place] Independent    Leisure Golf (last played in May 2022), sitting recreational, walking      Cognition   Overall Cognitive Status Within Functional Limits for tasks assessed      AROM  Right Knee Extension -14   in LAQ   Right Knee Flexion 80   seated heel slide     PROM   Right Knee Extension -9   supine with TKE stretch   Right Knee Flexion 83   after manual intervention in sitting                          [facility] Adult PT Treatment/Exercise - 06/17/21 1145       Ambulation/Gait   Ambulation/Gait Yes    Ambulation/Gait Assistance 5: Supervision    Ambulation/Gait Assistance Details verbal cues for knee flexion in swing    Assistive device None    Gait Pattern Antalgic;Decreased weight shift to right;Decreased stride length;Decreased stance time - right;Decreased step length - left;Right flexed knee in stance;Decreased hip/knee flexion - right      Self-Care   Self-Care ADL's    ADL's PT demo & instructed in using pillows for positioning in sidelying to sleep with pillow between LEs and foot of bed to "tent" covers off feet.      Knee/Hip  Exercises: Seated   Long Arc Quad AAROM;Strengthening;Right;2 sets;10 reps    Long Arc Quad Limitations contralateral knee flexion at end range RLE ext    Other Seated Knee/Hip Exercises knee flexed to tolerable stretch for 5-80min when sitting watching tv, eating, etc.  then knee ext (need to slide to edge of chair) for 5-10 min. pt verbalized & return demo understanding.      Knee/Hip Exercises: Supine   Quad Sets Strengthening;Right;2 sets;10 reps    Quad Sets Limitations Bil feet / ankles propped on bolster.  PT instructed how to use couch & armrests with pillows to elevate at home.    Heel Slides AAROM;Right;2 sets;10 reps   10 sec hold   Heel Slides Limitations pt gradually increased flexion with reps.    Heel Prop for Knee Extension 5 minutes    Heel Prop for Knee Extension Limitations PT instructed in positioning on couch armrest at home. Pt verbalized understanding.  Pt performed quad sets & ankle A-Z in this position.    Other Supine Knee/Hip Exercises ankle A-Z motions    Other Supine Knee/Hip Exercises PT demo & instructed in elevation to facilitate fluid back into circulatory system      Manual Therapy   Manual therapy comments seated Rt knee flexion mobilization c movement c IR/distraction c contralateral leg movement opposite and [employer] to quad             Vasopneumatic  Number Minutes Vasopneumatic  10 minutes   Vasopnuematic Location  Knee   Vasopneumatic Pressure Medium   Vasopneumatic Temperature  34              PT Short Term Goals - 06/12/21 1457       PT SHORT TERM GOAL #1   Title Patient will demonstrate independent use of home exercise program to maintain progress from in clinic treatments.    Time 3    Period Weeks    Status New    Target Date 07/03/21               PT Long Term Goals - 06/12/21 1457       PT LONG TERM GOAL #1   Title Patient will demonstrate/report pain at worst less than or equal to 2/10 to facilitate minimal  limitation in daily activity secondary to pain symptoms.    Time 10  Period Weeks    Status New    Target Date 08/21/21      PT LONG TERM GOAL #2   Title Patient will demonstrate independent use of home exercise program to facilitate ability to maintain/progress functional gains from skilled physical therapy services.    Time 10    Period Weeks    Status New    Target Date 08/21/21      PT LONG TERM GOAL #3   Title Pt. will demonstrate FOTO outcome > or = 72% to indicated reduced disability due to condition.    Time 10    Period Weeks    Status New    Target Date 08/21/21      PT LONG TERM GOAL #4   Title Patient will demonstrate Rt knee AROM 0-110 degrees to facilitate ability to perform transfers, sitting, ambulation, stair navigation s restriction due to mobility.    Time 10    Period Weeks    Status New    Target Date 08/21/21      PT LONG TERM GOAL #5   Title Patient will demonstrate Rt LE MMT 5/5 throughout to facilitate ability to perform usual standing, walking, stairs at PLOF s limitation due to symptoms.    Time 10    Period Weeks    Status New    Target Date 08/21/21      Additional Long Term Goals   Additional Long Term Goals Yes      PT LONG TERM GOAL #6   Title Pt. will demonstrate bilateral SLS > 15 seconds to facilitate stability in ambulation on even and uneven surfaces.    Time 10    Period Weeks    Status New    Target Date 08/21/21      PT LONG TERM GOAL #7   Title Pt. will demonstrate reciprocal gait pattern up/down 3 stairs s handrail for household entry.    Time 10    Period Weeks    Status New    Target Date 08/21/21                   Plan - 06/17/21 1150     Clinical Impression Statement PT instructed in elevation & exercises while elevated which she appears to understand.  Patient's pain is causing anxiety and increased gaurding with all movements.    Personal Factors and Comorbidities Comorbidity 2    Comorbidities History  of skin cancer, Hyperthyroidism    Examination-Activity Limitations Sit;Sleep;Bed Mobility;Squat;Bend;Stairs;Stand;Transfers;Locomotion Level    Examination-Participation Restrictions Community Activity;Driving;Cleaning;Laundry;Meal Prep;Other   walking, golf   Stability/Clinical Decision Making Stable/Uncomplicated    Rehab Potential Good    PT Frequency 2x / week    PT Duration Other (comment)   10 weeks   PT Treatment/Interventions ADLs/Self Care Home Management;Cryotherapy;Electrical Stimulation;Iontophoresis 4mg /ml Dexamethasone;Moist Heat;Balance training;Therapeutic exercise;Therapeutic activities;Functional mobility training;Stair training;Ultrasound;Neuromuscular re-education;Patient/family education;Passive range of motion;Spinal Manipulations;Joint Manipulations;Dry needling;Taping;Vasopneumatic Device;Manual techniques    PT Next Visit Plan Manual for flexion gains, extension quad strengthening, vaso    PT Home Exercise Plan [id number]    Consulted and Agree with Plan of Care Patient             Patient will benefit from skilled therapeutic intervention in order to improve the following deficits and impairments:  Abnormal gait, Decreased endurance, Hypomobility, Increased edema, Decreased activity tolerance, Decreased strength, Pain, Decreased balance, Decreased mobility, Difficulty walking, Impaired perceived functional ability, Improper body mechanics, Impaired flexibility, Decreased coordination, Decreased range of motion  Visit  Diagnosis: Chronic pain of right knee  Muscle weakness (generalized)  Difficulty in walking, not elsewhere classified  Localized edema     Problem List Patient Active Problem List   Diagnosis Date Noted   Status post total right knee replacement 05/30/2021   Unilateral primary osteoarthritis, right knee 05/29/2021   Unilateral primary osteoarthritis, left knee 04/01/2021   Acute appendicitis 01/28/2021   Hyperthyroidism 04/14/2020    Heterozygous factor V Leiden mutation ([place]) 09/23/2015   Lupus anticoagulant positive 09/23/2015   Varicose veins of both lower extremities 09/23/2015   Thrombophlebitis of superficial veins of right lower extremity 04/24/2014    [name], PT, DPT 06/17/2021, 1:01 PM  [facility] [address] [place], [place], [address] Phone: [phone]   Fax:  [phone]  Name: DEVOTA VIRUET MRN: [id number] Date of Birth: [birth year]

## 2021-06-19 ENCOUNTER — Ambulatory Visit: Payer: Medicare HMO | Admitting: Physical Therapy

## 2021-06-19 ENCOUNTER — Encounter: Payer: Self-pay | Admitting: Physical Therapy

## 2021-06-19 ENCOUNTER — Other Ambulatory Visit: Payer: Self-pay

## 2021-06-19 DIAGNOSIS — M6281 Muscle weakness (generalized): Secondary | ICD-10-CM | POA: Diagnosis not present

## 2021-06-19 DIAGNOSIS — M25561 Pain in right knee: Secondary | ICD-10-CM

## 2021-06-19 DIAGNOSIS — G8929 Other chronic pain: Secondary | ICD-10-CM | POA: Diagnosis not present

## 2021-06-19 DIAGNOSIS — R262 Difficulty in walking, not elsewhere classified: Secondary | ICD-10-CM | POA: Diagnosis not present

## 2021-06-19 DIAGNOSIS — R600 Localized edema: Secondary | ICD-10-CM

## 2021-06-19 NOTE — Therapy (Signed)
[facility] Physical Therapy [address] [place], [place], [address] Phone: [phone]   Fax:  [phone]  Physical Therapy Treatment  Patient Details  Name: Kathleen Peters MRN: [id number] Date of Birth: 1951-08-21 Referring Provider (PT): Mcarthur Rossetti, MD   Encounter Date: 06/19/2021   PT End of Session - 06/19/21 1152     Visit Number 3    Number of Visits 20    Date for PT Re-Evaluation 08/21/21    Authorization Type [employer] $10 copay    Progress Note Due on Visit 10    PT Start Time 1145    PT Stop Time 9798    PT Time Calculation (min) 50 min    Activity Tolerance Patient tolerated treatment well    Behavior During Therapy [facility] for tasks assessed/performed             Past Medical History:  Diagnosis Date   Arthritis    Back pain    Cancer ([place])    hx of skin cancer   Graves disease    Hyperthyroidism    Insomnia    Migraines     Past Surgical History:  Procedure Laterality Date   ABDOMINAL HYSTERECTOMY     ELBOW FRACTURE SURGERY     LAPAROSCOPIC APPENDECTOMY N/A 01/29/2021   Procedure: APPENDECTOMY LAPAROSCOPIC;  Surgeon: Clovis Riley, MD;  Location: [facility];  Service: General;  Laterality: N/A;   TONSILLECTOMY     TOTAL KNEE ARTHROPLASTY Right 05/30/2021   Procedure: RIGHT TOTAL KNEE ARTHROPLASTY;  Surgeon: Mcarthur Rossetti, MD;  Location: [facility];  Service: Orthopedics;  Laterality: Right;   TYMPANOSTOMY TUBE PLACEMENT      There were no vitals filed for this visit.   Subjective Assessment - 06/19/21 1145     Subjective She started the steriod med, takes Oxy & ibuprofen. She stayed in bed all night last night.    Limitations Sitting;Lifting;Standing;Walking;House hold activities    Patient Stated Goals Reduce pain, walk normally    Currently in Pain? Yes    Pain Score 8     Pain Location Knee    Pain Orientation Right;Medial;Lateral;Anterior    Pain Descriptors / Indicators Sore;Sharp;Throbbing    Pain  Type Surgical pain    Pain Onset 1 to 4 weeks ago   surgery   Pain Frequency Constant    Aggravating Factors  moving / bending knee    Pain Relieving Factors ice    Effect of Pain on Daily Activities limited in standing.                [facility] PT Assessment - 06/19/21 1145       Assessment   Medical Diagnosis M17.11 (ICD-10-CM) - Unilateral primary osteoarthritis, right knee  Z96.651 (ICD-10-CM) - Status post total right knee replacement    Referring Provider (PT) Mcarthur Rossetti, MD    Onset Date/Surgical Date 05/30/21      AROM   Right Knee Extension -11   LAQ     PROM   Right Knee Flexion 92   supine with femur perpendicular & aftter manual                          [facility] Adult PT Treatment/Exercise - 06/19/21 1145       Ambulation/Gait   Ambulation/Gait Yes    Ambulation/Gait Assistance 5: Supervision    Ambulation/Gait Assistance Details demo & verbal cues on intiating knee flex in terminal stance,  hip/knee flex clearing foot without circumducting in swing & initial contact with heel.  pt able to return demo at end of session with verbal cues    Assistive device None    Gait Pattern Antalgic;Decreased weight shift to right;Decreased stride length;Decreased stance time - right;Decreased step length - left;Right flexed knee in stance;Decreased hip/knee flexion - right      Self-Care   Self-Care Scar Mobilizations    ADL's --    Scar Mobilizations scar approximation (until steristrips come off) same direction movement med/lat & prox/distal. PT demo & verbal cues & pt verbalized understanding.      Knee/Hip Exercises: Aerobic   Nustep seat 8 level 3 with BLEs/BUEs for 30min      Knee/Hip Exercises: Standing   Other Standing Knee Exercises stepping RLE over 2"high 10" long box 20 reps RUE on counter      Knee/Hip Exercises: Seated   Long Arc Quad AROM;Strengthening;Right;2 sets;10 reps    Long Arc Quad Limitations contralateral knee flexion at  end range RLE ext    Heel Slides AROM;Right;2 sets;10 reps    Heel Slides Limitations foot on pillow case to minimize resistance.    Other Seated Knee/Hip Exercises --      Knee/Hip Exercises: Supine   Quad Sets --    Heel Slides AAROM;Right;2 sets;10 reps   10 sec hold   Heel Slides Limitations pt gradually increased flexion with reps.    Heel Prop for Knee Extension 5 minutes    Heel Prop for Knee Extension Limitations --    Other Supine Knee/Hip Exercises --    Other Supine Knee/Hip Exercises --      Modalities   Modalities Vasopneumatic      Vasopneumatic   Number Minutes Vasopneumatic  10 minutes    Vasopnuematic Location  Knee    Vasopneumatic Pressure Medium    Vasopneumatic Temperature  34      Manual Therapy   Manual Therapy Muscle Energy Technique    Manual therapy comments seated Rt knee flexion mobilization c movement c IR/distraction c contralateral leg movement opposite and Hypervolt to quad    Muscle Energy Technique antagonist contract-relax seated for knee flexion                       PT Short Term Goals - 06/12/21 1457       PT SHORT TERM GOAL #1   Title Patient will demonstrate independent use of home exercise program to maintain progress from in clinic treatments.    Time 3    Period Weeks    Status New    Target Date 07/03/21               PT Long Term Goals - 06/12/21 1457       PT LONG TERM GOAL #1   Title Patient will demonstrate/report pain at worst less than or equal to 2/10 to facilitate minimal limitation in daily activity secondary to pain symptoms.    Time 10    Period Weeks    Status New    Target Date 08/21/21      PT LONG TERM GOAL #2   Title Patient will demonstrate independent use of home exercise program to facilitate ability to maintain/progress functional gains from skilled physical therapy services.    Time 10    Period Weeks    Status New    Target Date 08/21/21      PT LONG TERM GOAL #3  Title Pt.  will demonstrate FOTO outcome > or = 72% to indicated reduced disability due to condition.    Time 10    Period Weeks    Status New    Target Date 08/21/21      PT LONG TERM GOAL #4   Title Patient will demonstrate Rt knee AROM 0-110 degrees to facilitate ability to perform transfers, sitting, ambulation, stair navigation s restriction due to mobility.    Time 10    Period Weeks    Status New    Target Date 08/21/21      PT LONG TERM GOAL #5   Title Patient will demonstrate Rt LE MMT 5/5 throughout to facilitate ability to perform usual standing, walking, stairs at PLOF s limitation due to symptoms.    Time 10    Period Weeks    Status New    Target Date 08/21/21      Additional Long Term Goals   Additional Long Term Goals Yes      PT LONG TERM GOAL #6   Title Pt. will demonstrate bilateral SLS > 15 seconds to facilitate stability in ambulation on even and uneven surfaces.    Time 10    Period Weeks    Status New    Target Date 08/21/21      PT LONG TERM GOAL #7   Title Pt. will demonstrate reciprocal gait pattern up/down 3 stairs s handrail for household entry.    Time 10    Period Weeks    Status New    Target Date 08/21/21                   Plan - 06/19/21 1154     Clinical Impression Statement Patient was less anxious during PT today.  Her range continues to slowly improve. She was able to flex knee minimally in gait.    Personal Factors and Comorbidities Comorbidity 2    Comorbidities History of skin cancer, Hyperthyroidism    Examination-Activity Limitations Sit;Sleep;Bed Mobility;Squat;Bend;Stairs;Stand;Transfers;Locomotion Level    Examination-Participation Restrictions Community Activity;Driving;Cleaning;Laundry;Meal Prep;Other   walking, golf   Stability/Clinical Decision Making Stable/Uncomplicated    Rehab Potential Good    PT Frequency 2x / week    PT Duration Other (comment)   10 weeks   PT Treatment/Interventions ADLs/Self Care Home  Management;Cryotherapy;Electrical Stimulation;Iontophoresis 4mg /ml Dexamethasone;Moist Heat;Balance training;Therapeutic exercise;Therapeutic activities;Functional mobility training;Stair training;Ultrasound;Neuromuscular re-education;Patient/family education;Passive range of motion;Spinal Manipulations;Joint Manipulations;Dry needling;Taping;Vasopneumatic Device;Manual techniques    PT Next Visit Plan update HEP,  Manual for flexion gains, extension quad strengthening, vaso    PT Home Exercise Plan [id number]    Consulted and Agree with Plan of Care Patient             Patient will benefit from skilled therapeutic intervention in order to improve the following deficits and impairments:  Abnormal gait, Decreased endurance, Hypomobility, Increased edema, Decreased activity tolerance, Decreased strength, Pain, Decreased balance, Decreased mobility, Difficulty walking, Impaired perceived functional ability, Improper body mechanics, Impaired flexibility, Decreased coordination, Decreased range of motion  Visit Diagnosis: Chronic pain of right knee  Muscle weakness (generalized)  Difficulty in walking, not elsewhere classified  Localized edema     Problem List Patient Active Problem List   Diagnosis Date Noted   Status post total right knee replacement 05/30/2021   Unilateral primary osteoarthritis, right knee 05/29/2021   Unilateral primary osteoarthritis, left knee 04/01/2021   Acute appendicitis 01/28/2021   Hyperthyroidism 04/14/2020   Heterozygous factor V Leiden mutation ([place]) 09/23/2015  Lupus anticoagulant positive 09/23/2015   Varicose veins of both lower extremities 09/23/2015   Thrombophlebitis of superficial veins of right lower extremity 04/24/2014    [name], PT, DPT 06/19/2021, 12:48 PM  [facility] Physical Therapy [address] [place], [place], [address] Phone: [phone]   Fax:  [phone]  Name: Kathleen Peters MRN:  [id number] Date of Birth: March 16, 1951

## 2021-06-24 ENCOUNTER — Encounter: Payer: Medicare HMO | Admitting: Physical Therapy

## 2021-06-24 ENCOUNTER — Ambulatory Visit (INDEPENDENT_AMBULATORY_CARE_PROVIDER_SITE_OTHER): Payer: Medicare HMO | Admitting: Physical Therapy

## 2021-06-24 ENCOUNTER — Other Ambulatory Visit: Payer: Self-pay

## 2021-06-24 ENCOUNTER — Encounter: Payer: Self-pay | Admitting: Physical Therapy

## 2021-06-24 DIAGNOSIS — M25561 Pain in right knee: Secondary | ICD-10-CM | POA: Diagnosis not present

## 2021-06-24 DIAGNOSIS — G8929 Other chronic pain: Secondary | ICD-10-CM

## 2021-06-24 DIAGNOSIS — R600 Localized edema: Secondary | ICD-10-CM | POA: Diagnosis not present

## 2021-06-24 DIAGNOSIS — R262 Difficulty in walking, not elsewhere classified: Secondary | ICD-10-CM

## 2021-06-24 DIAGNOSIS — M6281 Muscle weakness (generalized): Secondary | ICD-10-CM

## 2021-06-24 NOTE — Therapy (Signed)
[facility] Physical Therapy [address] [place], [place], [address] Phone: [phone]   Fax:  [phone]  Physical Therapy Treatment  Patient Details  Name: Kathleen Peters MRN: [id number] Date of Birth: [birth year] Referring Provider (PT): Mcarthur Rossetti, MD   Encounter Date: 06/24/2021   PT End of Session - 06/24/21 1429     Visit Number 4    Number of Visits 20    Date for PT Re-Evaluation 08/21/21    Authorization Type [employer] $10 copay    Progress Note Due on Visit 10    PT Start Time 1430    PT Stop Time 1521    PT Time Calculation (min) 51 min    Activity Tolerance Patient tolerated treatment well    Behavior During Therapy [facility] for tasks assessed/performed             Past Medical History:  Diagnosis Date   Arthritis    Back pain    Cancer ([place])    hx of skin cancer   Graves disease    Hyperthyroidism    Insomnia    Migraines     Past Surgical History:  Procedure Laterality Date   ABDOMINAL HYSTERECTOMY     ELBOW FRACTURE SURGERY     LAPAROSCOPIC APPENDECTOMY N/A 01/29/2021   Procedure: APPENDECTOMY LAPAROSCOPIC;  Surgeon: Clovis Riley, MD;  Location: [facility];  Service: General;  Laterality: N/A;   TONSILLECTOMY     TOTAL KNEE ARTHROPLASTY Right 05/30/2021   Procedure: RIGHT TOTAL KNEE ARTHROPLASTY;  Surgeon: Mcarthur Rossetti, MD;  Location: [facility];  Service: Orthopedics;  Laterality: Right;   TYMPANOSTOMY TUBE PLACEMENT      There were no vitals filed for this visit.   Subjective Assessment - 06/24/21 1429     Subjective Her knee felt better while on steriod (finished on Friday) then pain came back Sat & Sun.  Her exercises are going well but heel slide is hard due to pain.    Limitations Sitting;Lifting;Standing;Walking;House hold activities    Patient Stated Goals Reduce pain, walk normally    Currently in Pain? Yes    Pain Score 6    over last week, lowest 2/10 to highest 9/10   Pain Location Knee     Pain Orientation Right    Pain Descriptors / Indicators Sharp;Throbbing    Pain Type Surgical pain    Pain Onset 1 to 4 weeks ago   surgery   Pain Frequency Constant    Aggravating Factors  moving / bending knee    Pain Relieving Factors ice & meds    Effect of Pain on Daily Activities limited in standing                [facility] PT Assessment - 06/24/21 1430       Assessment   Medical Diagnosis M17.11 (ICD-10-CM) - Unilateral primary osteoarthritis, right knee  Z96.651 (ICD-10-CM) - Status post total right knee replacement    Referring Provider (PT) Mcarthur Rossetti, MD    Onset Date/Surgical Date 05/30/21      AROM   Right Knee Extension -8   seated LAQ   Right Knee Flexion 92   AAROM knee flex with strap foot on ball     PROM   Right Knee Flexion 95   seated after manual                          [facility] Adult PT Treatment/Exercise -  06/24/21 1430       Ambulation/Gait   Ambulation/Gait Yes    Ambulation/Gait Assistance 5: Supervision    Assistive device None    Gait Pattern Antalgic;Decreased weight shift to right;Decreased stride length;Decreased stance time - right;Decreased step length - left;Right flexed knee in stance;Decreased hip/knee flexion - right    Pre-Gait Activities in //bars worked on terminal stance knee flexion.      Self-Care   Self-Care Scar Mobilizations    Scar Mobilizations scar approximation (until steristrips come off) same direction movement med/lat & prox/distal. PT demo & verbal cues & pt verbalized understanding.      Knee/Hip Exercises: Stretches   Active Hamstring Stretch Right;20 seconds    Active Hamstring Stretch Limitations seated straight leg active DF forward lean    Quad Stretch Right;2 reps;20 seconds    Quad Stretch Limitations supine leg over edge with strap knee    Knee: Self-Stretch to increase Flexion Right;2 reps;20 seconds    Knee: Self-Stretch Limitations rt foot in chair with lunge stretch     Gastroc Stretch Right;2 reps;20 seconds    Gastroc Stretch Limitations heel depression off step      Knee/Hip Exercises: Aerobic   Nustep seat 8 level 4 with BLEs/BUEs for 10 min      Knee/Hip Exercises: Standing   Rocker Board 1 minute   ant/post & right /left, BUE support, square board w/2 pivot points   Theatre stage manager, demo & verbal cues on technique    Other Standing Knee Exercises stepping RLE over 2 "high 10" long box 20 reps RUE on counter      Knee/Hip Exercises: Seated   Long Arc Quad AROM;Strengthening;Right;2 sets;10 reps    Long Arc Quad Limitations contralateral knee flexion at end range RLE ext    Heel Slides --    Heel Slides Limitations --      Knee/Hip Exercises: Supine   Heel Slides AAROM;Right;2 sets;10 reps   10 sec hold   Heel Slides Limitations foot on swiss ball with strap assist    Heel Prop for Knee Extension 5 minutes      Modalities   Modalities Vasopneumatic      Vasopneumatic   Number Minutes Vasopneumatic  10 minutes    Vasopnuematic Location  Knee    Vasopneumatic Pressure Medium    Vasopneumatic Temperature  34      Manual Therapy   Manual Therapy Muscle Energy Technique    Manual therapy comments seated Rt knee flexion mobilization c movement c IR/distraction c contralateral leg movement opposite    Muscle Energy Technique antagonist contract-relax seated for knee flexion                       PT Short Term Goals - 06/12/21 1457       PT SHORT TERM GOAL #1   Title Patient will demonstrate independent use of home exercise program to maintain progress from in clinic treatments.    Time 3    Period Weeks    Status New    Target Date 07/03/21               PT Long Term Goals - 06/12/21 1457       PT LONG TERM GOAL #1   Title Patient will demonstrate/report pain at worst less than or equal to 2/10 to facilitate minimal limitation in daily activity secondary to pain symptoms.    Time 10    Period  Weeks    Status New    Target Date 08/21/21      PT LONG TERM GOAL #2   Title Patient will demonstrate independent use of home exercise program to facilitate ability to maintain/progress functional gains from skilled physical therapy services.    Time 10    Period Weeks    Status New    Target Date 08/21/21      PT LONG TERM GOAL #3   Title Pt. will demonstrate FOTO outcome > or = 72% to indicated reduced disability due to condition.    Time 10    Period Weeks    Status New    Target Date 08/21/21      PT LONG TERM GOAL #4   Title Patient will demonstrate Rt knee AROM 0-110 degrees to facilitate ability to perform transfers, sitting, ambulation, stair navigation s restriction due to mobility.    Time 10    Period Weeks    Status New    Target Date 08/21/21      PT LONG TERM GOAL #5   Title Patient will demonstrate Rt LE MMT 5/5 throughout to facilitate ability to perform usual standing, walking, stairs at PLOF s limitation due to symptoms.    Time 10    Period Weeks    Status New    Target Date 08/21/21      Additional Long Term Goals   Additional Long Term Goals Yes      PT LONG TERM GOAL #6   Title Pt. will demonstrate bilateral SLS > 15 seconds to facilitate stability in ambulation on even and uneven surfaces.    Time 10    Period Weeks    Status New    Target Date 08/21/21      PT LONG TERM GOAL #7   Title Pt. will demonstrate reciprocal gait pattern up/down 3 stairs s handrail for household entry.    Time 10    Period Weeks    Status New    Target Date 08/21/21                   Plan - 06/24/21 1430     Personal Factors and Comorbidities Comorbidity 2    Comorbidities History of skin cancer, Hyperthyroidism    Examination-Activity Limitations Sit;Sleep;Bed Mobility;Squat;Bend;Stairs;Stand;Transfers;Locomotion Level    Examination-Participation Restrictions Community Activity;Driving;Cleaning;Laundry;Meal Prep;Other   walking, golf    Stability/Clinical Decision Making Stable/Uncomplicated    Rehab Potential Good    PT Frequency 2x / week    PT Duration Other (comment)   10 weeks   PT Treatment/Interventions ADLs/Self Care Home Management;Cryotherapy;Electrical Stimulation;Iontophoresis 4mg /ml Dexamethasone;Moist Heat;Balance training;Therapeutic exercise;Therapeutic activities;Functional mobility training;Stair training;Ultrasound;Neuromuscular re-education;Patient/family education;Passive range of motion;Spinal Manipulations;Joint Manipulations;Dry needling;Taping;Vasopneumatic Device;Manual techniques    PT Next Visit Plan update HEP,  Manual for flexion gains, extension quad strengthening, vaso    PT Home Exercise Plan [id number]    Consulted and Agree with Plan of Care Patient             Patient will benefit from skilled therapeutic intervention in order to improve the following deficits and impairments:  Abnormal gait, Decreased endurance, Hypomobility, Increased edema, Decreased activity tolerance, Decreased strength, Pain, Decreased balance, Decreased mobility, Difficulty walking, Impaired perceived functional ability, Improper body mechanics, Impaired flexibility, Decreased coordination, Decreased range of motion  Visit Diagnosis: Chronic pain of right knee  Muscle weakness (generalized)  Difficulty in walking, not elsewhere classified  Localized edema     Problem List Patient Active Problem  List   Diagnosis Date Noted   Status post total right knee replacement 05/30/2021   Unilateral primary osteoarthritis, right knee 05/29/2021   Unilateral primary osteoarthritis, left knee 04/01/2021   Acute appendicitis 01/28/2021   Hyperthyroidism 04/14/2020   Heterozygous factor V Leiden mutation ([place]) 09/23/2015   Lupus anticoagulant positive 09/23/2015   Varicose veins of both lower extremities 09/23/2015   Thrombophlebitis of superficial veins of right lower extremity 04/24/2014    [name], PT,  DPT 06/24/2021, 3:14 PM  [facility] Physical Therapy [address] [place], [place], [address] Phone: [phone]   Fax:  [phone]  Name: Kathleen Peters MRN: [id number] Date of Birth: 1951/02/08

## 2021-06-25 ENCOUNTER — Telehealth: Payer: Self-pay | Admitting: *Deleted

## 2021-06-25 ENCOUNTER — Other Ambulatory Visit: Payer: Self-pay | Admitting: Orthopaedic Surgery

## 2021-06-25 MED ORDER — OXYCODONE HCL 5 MG PO TABS: 5.0000 mg | ORAL_TABLET | Freq: Four times a day (QID) | ORAL | 0 refills | Status: DC | PRN

## 2021-06-25 MED ORDER — GABAPENTIN 100 MG PO CAPS: 100.0000 mg | ORAL_CAPSULE | Freq: Three times a day (TID) | ORAL | 0 refills | Status: DC

## 2021-06-25 NOTE — Telephone Encounter (Signed)
Patient called requesting refill of pain medication. She states oral steroid did make her feel a little better, but still having "extreme pain". Attending therapy. She did ask if she needed more steroid as quoted-she "didn't feel that was a very high dose". Any other recommendations?

## 2021-06-26 ENCOUNTER — Encounter: Payer: Self-pay | Admitting: Rehabilitative and Restorative Service Providers"

## 2021-06-26 ENCOUNTER — Other Ambulatory Visit: Payer: Self-pay

## 2021-06-26 ENCOUNTER — Ambulatory Visit (INDEPENDENT_AMBULATORY_CARE_PROVIDER_SITE_OTHER): Payer: Medicare HMO | Admitting: Rehabilitative and Restorative Service Providers"

## 2021-06-26 ENCOUNTER — Telehealth: Payer: Self-pay | Admitting: *Deleted

## 2021-06-26 DIAGNOSIS — M25561 Pain in right knee: Secondary | ICD-10-CM

## 2021-06-26 DIAGNOSIS — G8929 Other chronic pain: Secondary | ICD-10-CM | POA: Diagnosis not present

## 2021-06-26 DIAGNOSIS — R600 Localized edema: Secondary | ICD-10-CM

## 2021-06-26 DIAGNOSIS — R262 Difficulty in walking, not elsewhere classified: Secondary | ICD-10-CM | POA: Diagnosis not present

## 2021-06-26 DIAGNOSIS — M6281 Muscle weakness (generalized): Secondary | ICD-10-CM | POA: Diagnosis not present

## 2021-06-26 NOTE — Therapy (Addendum)
[facility] [address] [place], [place], [address] Phone: [phone]   Fax:  [phone]  Physical Therapy Treatment  Patient Details  Name: Kathleen Peters MRN: [id number] Date of Birth: [birth year] Referring Provider (PT): Mcarthur Rossetti, MD   Encounter Date: 06/26/2021   PT End of Session - 06/26/21 1146     Visit Number 5    Number of Visits 20    Date for PT Re-Evaluation 08/21/21    Authorization Type [employer] $10 copay    Progress Note Due on Visit 10    PT Start Time 3825    PT Stop Time 1232    PT Time Calculation (min) 50 min    Activity Tolerance Patient limited by pain    Behavior During Therapy [facility] for tasks assessed/performed             Past Medical History:  Diagnosis Date   Arthritis    Back pain    Cancer ([place])    hx of skin cancer   Graves disease    Hyperthyroidism    Insomnia    Migraines     Past Surgical History:  Procedure Laterality Date   ABDOMINAL HYSTERECTOMY     ELBOW FRACTURE SURGERY     LAPAROSCOPIC APPENDECTOMY N/A 01/29/2021   Procedure: APPENDECTOMY LAPAROSCOPIC;  Surgeon: Clovis Riley, MD;  Location: [facility];  Service: General;  Laterality: N/A;   TONSILLECTOMY     TOTAL KNEE ARTHROPLASTY Right 05/30/2021   Procedure: RIGHT TOTAL KNEE ARTHROPLASTY;  Surgeon: Mcarthur Rossetti, MD;  Location: [facility];  Service: Orthopedics;  Laterality: Right;   TYMPANOSTOMY TUBE PLACEMENT      There were no vitals filed for this visit.   Subjective Assessment - 06/26/21 1144     Subjective Pt. reported "at least an 8" upon arrival today, still having throbbing pain and can't bend knee.  Pt. indicated having pain in back with walking as well.    Limitations Sitting;Lifting;Standing;Walking;House hold activities    Patient Stated Goals Reduce pain, walk normally    Currently in Pain? Yes    Pain Score 8     Pain Location Knee    Pain Orientation Right    Pain Descriptors / Indicators  Sharp;Throbbing;Tightness    Pain Onset 1 to 4 weeks ago   surgery   Pain Frequency Constant    Aggravating Factors  constsant, walking    Pain Relieving Factors rest                               [facility] Adult PT Treatment/Exercise - 06/26/21 0001       Exercises   Other Exercises  Additional time spent in review of importance of routine HEP and cues for improving ROM.  Education also given on pain response overall since surgery(pain medications initially helped, then wore off and activity level increased but performance of leg wasn't 100% leading to aggravation (noted in back)).   Adjustment of intervention today compared to past visits to allow education      Knee/Hip Exercises: Aerobic   Nustep Lvl 5 15 mins UE/LE      Knee/Hip Exercises: Seated   Long Arc Quad AROM;Both;2 sets;10 reps    Long Arc Quad Limitations contralateral knee movement opposite    Other Seated Knee/Hip Exercises overpressure c Lt leg 30 sec x 5    Other Seated Knee/Hip Exercises seated quad set 5 sec  hold x 10, SLR Rt 2 x10    Sit to Sand 10 reps;without UE support   cues for positioning, slow eccentric                                           PT Short Term Goals - 06/26/21 1242       PT SHORT TERM GOAL #1   Title Patient will demonstrate independent use of home exercise program to maintain progress from in clinic treatments.    Time 3    Period Weeks    Status On-going    Target Date 07/03/21               PT Long Term Goals - 06/12/21 1457       PT LONG TERM GOAL #1   Title Patient will demonstrate/report pain at worst less than or equal to 2/10 to facilitate minimal limitation in daily activity secondary to pain symptoms.    Time 10    Period Weeks    Status New    Target Date 08/21/21      PT LONG TERM GOAL #2   Title Patient will demonstrate independent use of home exercise program to facilitate ability to maintain/progress functional gains  from skilled physical therapy services.    Time 10    Period Weeks    Status New    Target Date 08/21/21      PT LONG TERM GOAL #3   Title Pt. will demonstrate FOTO outcome > or = 72% to indicated reduced disability due to condition.    Time 10    Period Weeks    Status New    Target Date 08/21/21      PT LONG TERM GOAL #4   Title Patient will demonstrate Rt knee AROM 0-110 degrees to facilitate ability to perform transfers, sitting, ambulation, stair navigation s restriction due to mobility.    Time 10    Period Weeks    Status New    Target Date 08/21/21      PT LONG TERM GOAL #5   Title Patient will demonstrate Rt LE MMT 5/5 throughout to facilitate ability to perform usual standing, walking, stairs at PLOF s limitation due to symptoms.    Time 10    Period Weeks    Status New    Target Date 08/21/21      Additional Long Term Goals   Additional Long Term Goals Yes      PT LONG TERM GOAL #6   Title Pt. will demonstrate bilateral SLS > 15 seconds to facilitate stability in ambulation on even and uneven surfaces.    Time 10    Period Weeks    Status New    Target Date 08/21/21      PT LONG TERM GOAL #7   Title Pt. will demonstrate reciprocal gait pattern up/down 3 stairs s handrail for household entry.    Time 10    Period Weeks    Status New    Target Date 08/21/21                   Plan - 06/26/21 1239     Clinical Impression Statement Extended time spent throughout intervention in reassurance education on current presentation and overall prognosis.  Objectively, Pt. has demonstrated progressive improvement in Rt knee mobility to this point and improved quality  in quad extension.  Spent today without manual intervention to allow patient efficacy in choosing end points for mobility interventions.  Ambulation continued to show as antalgic c minimal knee mobility c hip circumduction, hip hike to clear. Current gait cycle most likely reasoning for increased back  pain complaints as Pt. has become more active.    Personal Factors and Comorbidities Comorbidity 2    Comorbidities History of skin cancer, Hyperthyroidism    Examination-Activity Limitations Sit;Sleep;Bed Mobility;Squat;Bend;Stairs;Stand;Transfers;Locomotion Level    Examination-Participation Restrictions Community Activity;Driving;Cleaning;Laundry;Meal Prep;Other   walking, golf   Stability/Clinical Decision Making Stable/Uncomplicated    Rehab Potential Good    PT Frequency 2x / week    PT Duration Other (comment)   10 weeks   PT Treatment/Interventions ADLs/Self Care Home Management;Cryotherapy;Electrical Stimulation;Iontophoresis 4mg /ml Dexamethasone;Moist Heat;Balance training;Therapeutic exercise;Therapeutic activities;Functional mobility training;Stair training;Ultrasound;Neuromuscular re-education;Patient/family education;Passive range of motion;Spinal Manipulations;Joint Manipulations;Dry needling;Taping;Vasopneumatic Device;Manual techniques    PT Next Visit Plan Continue to improve flexion mobility in manual/ther ex combination, resume vaso next visit.    PT Home Exercise Plan [id number]    Consulted and Agree with Plan of Care Patient             Patient will benefit from skilled therapeutic intervention in order to improve the following deficits and impairments:  Abnormal gait, Decreased endurance, Hypomobility, Increased edema, Decreased activity tolerance, Decreased strength, Pain, Decreased balance, Decreased mobility, Difficulty walking, Impaired perceived functional ability, Improper body mechanics, Impaired flexibility, Decreased coordination, Decreased range of motion  Visit Diagnosis: Chronic pain of right knee  Muscle weakness (generalized)  Difficulty in walking, not elsewhere classified  Localized edema     Problem List Patient Active Problem List   Diagnosis Date Noted   Status post total right knee replacement 05/30/2021   Unilateral primary  osteoarthritis, right knee 05/29/2021   Unilateral primary osteoarthritis, left knee 04/01/2021   Acute appendicitis 01/28/2021   Hyperthyroidism 04/14/2020   Heterozygous factor V Leiden mutation ([place]) 09/23/2015   Lupus anticoagulant positive 09/23/2015   Varicose veins of both lower extremities 09/23/2015   Thrombophlebitis of superficial veins of right lower extremity 04/24/2014   Scot Jun, PT, DPT, OCS, ATC 06/26/21  12:44 PM   [facility] [address] [place], [place], [address] Phone: [phone]   Fax:  [phone]  Name: Kathleen Peters MRN: [id number] Date of Birth: April 25, 1951

## 2021-06-26 NOTE — Telephone Encounter (Signed)
Ortho bundle call: Call to patient and discussed medication change per MD with addition of Gabapentin as well as refill of pain medication. Reinforced home exercises and pain management. Patient very frustrated stating she feels she is not where she needs to be at almost 4 weeks post op. She is extremely concerned regarding her pain. Encouragement provided and recommendations of starting Gabapentin to help with pain if nerve related in nature. She has completed her oral steroids as well. Therapy today. Will call back and check on her at 30 days- early next week.

## 2021-07-01 ENCOUNTER — Encounter: Payer: Medicare HMO | Admitting: Rehabilitative and Restorative Service Providers"

## 2021-07-03 ENCOUNTER — Telehealth: Payer: Self-pay | Admitting: *Deleted

## 2021-07-03 ENCOUNTER — Encounter: Payer: Self-pay | Admitting: Rehabilitative and Restorative Service Providers"

## 2021-07-03 ENCOUNTER — Ambulatory Visit (INDEPENDENT_AMBULATORY_CARE_PROVIDER_SITE_OTHER): Payer: Medicare HMO | Admitting: Rehabilitative and Restorative Service Providers"

## 2021-07-03 ENCOUNTER — Other Ambulatory Visit: Payer: Self-pay

## 2021-07-03 DIAGNOSIS — R600 Localized edema: Secondary | ICD-10-CM | POA: Diagnosis not present

## 2021-07-03 DIAGNOSIS — Z23 Encounter for immunization: Secondary | ICD-10-CM | POA: Diagnosis not present

## 2021-07-03 DIAGNOSIS — R262 Difficulty in walking, not elsewhere classified: Secondary | ICD-10-CM | POA: Diagnosis not present

## 2021-07-03 DIAGNOSIS — M6281 Muscle weakness (generalized): Secondary | ICD-10-CM

## 2021-07-03 DIAGNOSIS — M25561 Pain in right knee: Secondary | ICD-10-CM

## 2021-07-03 DIAGNOSIS — G8929 Other chronic pain: Secondary | ICD-10-CM

## 2021-07-03 NOTE — Therapy (Signed)
[facility] [address] [place], [place], [address] Phone: [phone]   Fax:  [phone]  Physical Therapy Treatment  Patient Details  Name: Kathleen Peters MRN: [id number] Date of Birth: [birth year] Referring Provider (PT): Mcarthur Rossetti, MD   Encounter Date: 07/03/2021   PT End of Session - 07/03/21 1213     Visit Number 6    Number of Visits 20    Date for PT Re-Evaluation 08/21/21    Authorization Type [employer] $10 copay    Progress Note Due on Visit 10    PT Start Time 1140    PT Stop Time 1230    PT Time Calculation (min) 50 min    Activity Tolerance Patient tolerated treatment well    Behavior During Therapy [facility] for tasks assessed/performed             Past Medical History:  Diagnosis Date   Arthritis    Back pain    Cancer ([place])    hx of skin cancer   Graves disease    Hyperthyroidism    Insomnia    Migraines     Past Surgical History:  Procedure Laterality Date   ABDOMINAL HYSTERECTOMY     ELBOW FRACTURE SURGERY     LAPAROSCOPIC APPENDECTOMY N/A 01/29/2021   Procedure: APPENDECTOMY LAPAROSCOPIC;  Surgeon: Clovis Riley, MD;  Location: [facility];  Service: General;  Laterality: N/A;   TONSILLECTOMY     TOTAL KNEE ARTHROPLASTY Right 05/30/2021   Procedure: RIGHT TOTAL KNEE ARTHROPLASTY;  Surgeon: Mcarthur Rossetti, MD;  Location: [facility];  Service: Orthopedics;  Laterality: Right;   TYMPANOSTOMY TUBE PLACEMENT      There were no vitals filed for this visit.   Subjective Assessment - 07/03/21 1143     Subjective Pt. indicated feeling improvement since starting new medicine.  Pt. indicated pain at 1/10 in knee.  Still taking pain medicine to help back pain.  Pt. indicated feeling better has helped her do more at home.    Limitations Sitting;Lifting;Standing;Walking;House hold activities    Patient Stated Goals Reduce pain, walk normally    Currently in Pain? Yes    Pain Location Knee    Pain  Orientation Right    Pain Descriptors / Indicators Sore;Tightness;Aching    Pain Type Surgical pain    Pain Onset 1 to 4 weeks ago   surgery   Pain Frequency Constant    Aggravating Factors  constant, standing on it, end range    Pain Relieving Factors rest                [facility] PT Assessment - 07/03/21 0001       Assessment   Medical Diagnosis M17.11 (ICD-10-CM) - Unilateral primary osteoarthritis, right knee  Z96.651 (ICD-10-CM) - Status post total right knee replacement    Referring Provider (PT) Mcarthur Rossetti, MD    Onset Date/Surgical Date 05/30/21    Hand Dominance Right      AROM   Right Knee Extension -3   in LAQ   Right Knee Flexion 105   in supine heel slide     Strength   Right Knee Extension 4/5   27.2, 29.7 lbs     Ambulation/Gait   Ambulation/Gait Assistance 7: Independent                           [facility] Adult PT Treatment/Exercise - 07/03/21 0001  Knee/Hip Exercises: Stretches   Passive Hamstring Stretch 1 rep;60 seconds;Right   on leg press after sets   Gastroc Stretch 30 seconds;3 reps;Both   incline board     Knee/Hip Exercises: Aerobic   Nustep Lvl 5 6 mins UE/LE      Knee/Hip Exercises: Machines for Strengthening   Total Gym Leg Press Double leg 81 lbs 2 x 10, single leg 31 lbs 2 x 10 Rt      Knee/Hip Exercises: Standing   Forward Step Up Hand Hold: 0;2 sets;10 reps;Right      Knee/Hip Exercises: Seated   Long Arc Quad Right;3 sets;10 reps   contralateral leg movement opposite   Long Arc Quad Weight 4 lbs.    Other Seated Knee/Hip Exercises overpressure c Lt leg 30 sec x 5      Knee/Hip Exercises: Supine   Heel Slides AROM;Right;10 reps      Vasopneumatic   Number Minutes Vasopneumatic  10 minutes    Vasopnuematic Location  Knee    Vasopneumatic Pressure Medium    Vasopneumatic Temperature  34                       PT Short Term Goals - 06/26/21 1242       PT SHORT TERM GOAL #1    Title Patient will demonstrate independent use of home exercise program to maintain progress from in clinic treatments.    Time 3    Period Weeks    Status On-going    Target Date 07/03/21               PT Long Term Goals - 06/12/21 1457       PT LONG TERM GOAL #1   Title Patient will demonstrate/report pain at worst less than or equal to 2/10 to facilitate minimal limitation in daily activity secondary to pain symptoms.    Time 10    Period Weeks    Status New    Target Date 08/21/21      PT LONG TERM GOAL #2   Title Patient will demonstrate independent use of home exercise program to facilitate ability to maintain/progress functional gains from skilled physical therapy services.    Time 10    Period Weeks    Status New    Target Date 08/21/21      PT LONG TERM GOAL #3   Title Pt. will demonstrate FOTO outcome > or = 72% to indicated reduced disability due to condition.    Time 10    Period Weeks    Status New    Target Date 08/21/21      PT LONG TERM GOAL #4   Title Patient will demonstrate Rt knee AROM 0-110 degrees to facilitate ability to perform transfers, sitting, ambulation, stair navigation s restriction due to mobility.    Time 10    Period Weeks    Status New    Target Date 08/21/21      PT LONG TERM GOAL #5   Title Patient will demonstrate Rt LE MMT 5/5 throughout to facilitate ability to perform usual standing, walking, stairs at PLOF s limitation due to symptoms.    Time 10    Period Weeks    Status New    Target Date 08/21/21      Additional Long Term Goals   Additional Long Term Goals Yes      PT LONG TERM GOAL #6   Title Pt. will demonstrate bilateral SLS >  15 seconds to facilitate stability in ambulation on even and uneven surfaces.    Time 10    Period Weeks    Status New    Target Date 08/21/21      PT LONG TERM GOAL #7   Title Pt. will demonstrate reciprocal gait pattern up/down 3 stairs s handrail for household entry.    Time 10     Period Weeks    Status New    Target Date 08/21/21                   Plan - 07/03/21 1211     Clinical Impression Statement Objective measurement reassessment today showed gains in ROM and strength as noted but impairment still noted warranting continued skilled PT services.  Overall positive interaction with thoughts from Pt. today in part due to successful reduction in pain levels.    Personal Factors and Comorbidities Comorbidity 2    Comorbidities History of skin cancer, Hyperthyroidism    Examination-Activity Limitations Sit;Sleep;Bed Mobility;Squat;Bend;Stairs;Stand;Transfers;Locomotion Level    Examination-Participation Restrictions Community Activity;Driving;Cleaning;Laundry;Meal Prep;Other   walking, golf   Stability/Clinical Decision Making Stable/Uncomplicated    Rehab Potential Good    PT Frequency 2x / week    PT Duration Other (comment)   10 weeks   PT Treatment/Interventions ADLs/Self Care Home Management;Cryotherapy;Electrical Stimulation;Iontophoresis 4mg /ml Dexamethasone;Moist Heat;Balance training;Therapeutic exercise;Therapeutic activities;Functional mobility training;Stair training;Ultrasound;Neuromuscular re-education;Patient/family education;Passive range of motion;Spinal Manipulations;Joint Manipulations;Dry needling;Taping;Vasopneumatic Device;Manual techniques    PT Next Visit Plan Continue to improve flexion mobility in manual/ther ex combination, progressive strengthening.    PT Home Exercise Plan [id number]    Consulted and Agree with Plan of Care Patient             Patient will benefit from skilled therapeutic intervention in order to improve the following deficits and impairments:  Abnormal gait, Decreased endurance, Hypomobility, Increased edema, Decreased activity tolerance, Decreased strength, Pain, Decreased balance, Decreased mobility, Difficulty walking, Impaired perceived functional ability, Improper body mechanics, Impaired flexibility,  Decreased coordination, Decreased range of motion  Visit Diagnosis: Chronic pain of right knee  Muscle weakness (generalized)  Difficulty in walking, not elsewhere classified  Localized edema     Problem List Patient Active Problem List   Diagnosis Date Noted   Status post total right knee replacement 05/30/2021   Unilateral primary osteoarthritis, right knee 05/29/2021   Unilateral primary osteoarthritis, left knee 04/01/2021   Acute appendicitis 01/28/2021   Hyperthyroidism 04/14/2020   Heterozygous factor V Leiden mutation ([place]) 09/23/2015   Lupus anticoagulant positive 09/23/2015   Varicose veins of both lower extremities 09/23/2015   Thrombophlebitis of superficial veins of right lower extremity 04/24/2014   Scot Jun, PT, DPT, OCS, ATC 07/03/21  12:21 PM    [facility] [address] [place], [place], [address] Phone: [phone]   Fax:  [phone]  Name: Kathleen Peters MRN: [id number] Date of Birth: 1951/04/08

## 2021-07-03 NOTE — Telephone Encounter (Signed)
Ortho bundle 30 day call completed. °

## 2021-07-07 ENCOUNTER — Telehealth: Payer: Self-pay | Admitting: *Deleted

## 2021-07-07 ENCOUNTER — Other Ambulatory Visit: Payer: Self-pay | Admitting: Orthopaedic Surgery

## 2021-07-07 MED ORDER — OXYCODONE HCL 5 MG PO TABS: 5.0000 mg | ORAL_TABLET | Freq: Four times a day (QID) | ORAL | 0 refills | Status: DC | PRN

## 2021-07-07 MED ORDER — GABAPENTIN 300 MG PO CAPS: 300.0000 mg | ORAL_CAPSULE | Freq: Three times a day (TID) | ORAL | 0 refills | Status: DC

## 2021-07-07 NOTE — Telephone Encounter (Signed)
Call from patient stating she would like refill of pain medication. Also asked if she could increase her Gabapentin at all. She was feeling so much better last week and worked with therapy. She now feels just like she did, with "unbearable" pain at the side of the knee, difficulty moving and doing any exercises. F/U in the office Thursday of this week. Thanks.

## 2021-07-08 ENCOUNTER — Encounter: Payer: Self-pay | Admitting: Rehabilitative and Restorative Service Providers"

## 2021-07-08 ENCOUNTER — Telehealth: Payer: Self-pay | Admitting: *Deleted

## 2021-07-08 ENCOUNTER — Other Ambulatory Visit: Payer: Self-pay

## 2021-07-08 ENCOUNTER — Ambulatory Visit: Payer: Medicare HMO | Admitting: Rehabilitative and Restorative Service Providers"

## 2021-07-08 DIAGNOSIS — M6281 Muscle weakness (generalized): Secondary | ICD-10-CM

## 2021-07-08 DIAGNOSIS — M25561 Pain in right knee: Secondary | ICD-10-CM

## 2021-07-08 DIAGNOSIS — R600 Localized edema: Secondary | ICD-10-CM

## 2021-07-08 DIAGNOSIS — G8929 Other chronic pain: Secondary | ICD-10-CM

## 2021-07-08 DIAGNOSIS — R262 Difficulty in walking, not elsewhere classified: Secondary | ICD-10-CM | POA: Diagnosis not present

## 2021-07-08 NOTE — Therapy (Signed)
[facility] [address] [place], [place], [address] Phone: [phone]   Fax:  [phone]  Physical Therapy Treatment  Patient Details  Name: Kathleen Peters MRN: [id number] Date of Birth: [birth year] Referring Provider (PT): Mcarthur Rossetti, MD   Encounter Date: 07/08/2021   PT End of Session - 07/08/21 1456     Visit Number 7    Number of Visits 20    Date for PT Re-Evaluation 08/21/21    Authorization Type [employer] $10 copay    Progress Note Due on Visit 10    PT Start Time 1431    PT Stop Time 1521    PT Time Calculation (min) 50 min    Activity Tolerance Patient tolerated treatment well    Behavior During Therapy [facility] for tasks assessed/performed             Past Medical History:  Diagnosis Date   Arthritis    Back pain    Cancer ([place])    hx of skin cancer   Graves disease    Hyperthyroidism    Insomnia    Migraines     Past Surgical History:  Procedure Laterality Date   ABDOMINAL HYSTERECTOMY     ELBOW FRACTURE SURGERY     LAPAROSCOPIC APPENDECTOMY N/A 01/29/2021   Procedure: APPENDECTOMY LAPAROSCOPIC;  Surgeon: Clovis Riley, MD;  Location: [facility];  Service: General;  Laterality: N/A;   TONSILLECTOMY     TOTAL KNEE ARTHROPLASTY Right 05/30/2021   Procedure: RIGHT TOTAL KNEE ARTHROPLASTY;  Surgeon: Mcarthur Rossetti, MD;  Location: [facility];  Service: Orthopedics;  Laterality: Right;   TYMPANOSTOMY TUBE PLACEMENT      There were no vitals filed for this visit.   Subjective Assessment - 07/08/21 1441     Subjective Pt. indicatd pain around 5/10 today, having pain after last treatment session.  Increase in medication was reported.    Limitations Sitting;Lifting;Standing;Walking;House hold activities    Patient Stated Goals Reduce pain, walk normally    Currently in Pain? Yes    Pain Score 5     Pain Location Knee    Pain Orientation Right    Pain Descriptors / Indicators Sore;Tightness;Aching     Pain Type Surgical pain    Pain Onset 1 to 4 weeks ago   surgery   Pain Frequency Constant    Aggravating Factors  constant, more after activity in last few days.    Pain Relieving Factors rest, medicine at times                               [facility] Adult PT Treatment/Exercise - 07/08/21 0001       Knee/Hip Exercises: Stretches   Active Hamstring Stretch Right;30 seconds;3 reps    Active Hamstring Stretch Limitations seated straight leg active DF forward lean    Gastroc Stretch 30 seconds;5 reps;Both   incline board     Knee/Hip Exercises: Aerobic   Recumbent Bike Partial circles 7 mins, seat 6      Knee/Hip Exercises: Machines for Strengthening   Total Gym Leg Press single leg 31 lbs 2 x 10 performed bilateral      Knee/Hip Exercises: Seated   Long Arc Quad Right;3 sets;10 reps   contralateral leg movement opposite   Long Arc Quad Weight 4 lbs.    Other Seated Knee/Hip Exercises overpressure c Lt leg 30 sec x 3      Knee/Hip  Exercises: Supine   Heel Slides AROM;Right;10 reps      Vasopneumatic   Number Minutes Vasopneumatic  10 minutes    Vasopnuematic Location  Knee    Vasopneumatic Pressure Medium    Vasopneumatic Temperature  34                       PT Short Term Goals - 06/26/21 1242       PT SHORT TERM GOAL #1   Title Patient will demonstrate independent use of home exercise program to maintain progress from in clinic treatments.    Time 3    Period Weeks    Status On-going    Target Date 07/03/21               PT Long Term Goals - 06/12/21 1457       PT LONG TERM GOAL #1   Title Patient will demonstrate/report pain at worst less than or equal to 2/10 to facilitate minimal limitation in daily activity secondary to pain symptoms.    Time 10    Period Weeks    Status New    Target Date 08/21/21      PT LONG TERM GOAL #2   Title Patient will demonstrate independent use of home exercise program to facilitate ability  to maintain/progress functional gains from skilled physical therapy services.    Time 10    Period Weeks    Status New    Target Date 08/21/21      PT LONG TERM GOAL #3   Title Pt. will demonstrate FOTO outcome > or = 72% to indicated reduced disability due to condition.    Time 10    Period Weeks    Status New    Target Date 08/21/21      PT LONG TERM GOAL #4   Title Patient will demonstrate Rt knee AROM 0-110 degrees to facilitate ability to perform transfers, sitting, ambulation, stair navigation s restriction due to mobility.    Time 10    Period Weeks    Status New    Target Date 08/21/21      PT LONG TERM GOAL #5   Title Patient will demonstrate Rt LE MMT 5/5 throughout to facilitate ability to perform usual standing, walking, stairs at PLOF s limitation due to symptoms.    Time 10    Period Weeks    Status New    Target Date 08/21/21      Additional Long Term Goals   Additional Long Term Goals Yes      PT LONG TERM GOAL #6   Title Pt. will demonstrate bilateral SLS > 15 seconds to facilitate stability in ambulation on even and uneven surfaces.    Time 10    Period Weeks    Status New    Target Date 08/21/21      PT LONG TERM GOAL #7   Title Pt. will demonstrate reciprocal gait pattern up/down 3 stairs s handrail for household entry.    Time 10    Period Weeks    Status New    Target Date 08/21/21                   Plan - 07/08/21 1446     Clinical Impression Statement Pt. continued to have difficulty c variable levels of pain overall as previously discussed.  Kept intervention plan similar today without progression to allow body accomodation to activity. Continued skilled PT services indicated  at this time.    Personal Factors and Comorbidities Comorbidity 2    Comorbidities History of skin cancer, Hyperthyroidism    Examination-Activity Limitations Sit;Sleep;Bed Mobility;Squat;Bend;Stairs;Stand;Transfers;Locomotion Level     Examination-Participation Restrictions Community Activity;Driving;Cleaning;Laundry;Meal Prep;Other   walking, golf   Stability/Clinical Decision Making Stable/Uncomplicated    Rehab Potential Good    PT Frequency 2x / week    PT Duration Other (comment)   10 weeks   PT Treatment/Interventions ADLs/Self Care Home Management;Cryotherapy;Electrical Stimulation;Iontophoresis 4mg /ml Dexamethasone;Moist Heat;Balance training;Therapeutic exercise;Therapeutic activities;Functional mobility training;Stair training;Ultrasound;Neuromuscular re-education;Patient/family education;Passive range of motion;Spinal Manipulations;Joint Manipulations;Dry needling;Taping;Vasopneumatic Device;Manual techniques    PT Next Visit Plan Continue plan with incremental increases as tolerated.    PT Home Exercise Plan [id number]    Consulted and Agree with Plan of Care Patient             Patient will benefit from skilled therapeutic intervention in order to improve the following deficits and impairments:  Abnormal gait, Decreased endurance, Hypomobility, Increased edema, Decreased activity tolerance, Decreased strength, Pain, Decreased balance, Decreased mobility, Difficulty walking, Impaired perceived functional ability, Improper body mechanics, Impaired flexibility, Decreased coordination, Decreased range of motion  Visit Diagnosis: Chronic pain of right knee  Muscle weakness (generalized)  Difficulty in walking, not elsewhere classified  Localized edema     Problem List Patient Active Problem List   Diagnosis Date Noted   Status post total right knee replacement 05/30/2021   Unilateral primary osteoarthritis, right knee 05/29/2021   Unilateral primary osteoarthritis, left knee 04/01/2021   Acute appendicitis 01/28/2021   Hyperthyroidism 04/14/2020   Heterozygous factor V Leiden mutation ([place]) 09/23/2015   Lupus anticoagulant positive 09/23/2015   Varicose veins of both lower extremities 09/23/2015    Thrombophlebitis of superficial veins of right lower extremity 04/24/2014    Scot Jun, PT, DPT, OCS, ATC 07/08/21  3:08 PM    [facility] [address] [place], [place], [address] Phone: [phone]   Fax:  [phone]  Name: IVORIE UPLINGER MRN: [id number] Date of Birth: [birth year]

## 2021-07-08 NOTE — Telephone Encounter (Signed)
Call to patient and updated on MD response that he has refilled pain medication and increased Gabapentin dose. Encouragement given and requested to please continue with therapy scheduled.

## 2021-07-10 ENCOUNTER — Ambulatory Visit: Payer: Medicare HMO | Admitting: Rehabilitative and Restorative Service Providers"

## 2021-07-10 ENCOUNTER — Encounter: Payer: Self-pay | Admitting: Rehabilitative and Restorative Service Providers"

## 2021-07-10 ENCOUNTER — Ambulatory Visit (INDEPENDENT_AMBULATORY_CARE_PROVIDER_SITE_OTHER): Payer: Medicare HMO | Admitting: Orthopaedic Surgery

## 2021-07-10 ENCOUNTER — Other Ambulatory Visit: Payer: Self-pay

## 2021-07-10 ENCOUNTER — Encounter: Payer: Self-pay | Admitting: Orthopaedic Surgery

## 2021-07-10 DIAGNOSIS — R262 Difficulty in walking, not elsewhere classified: Secondary | ICD-10-CM | POA: Diagnosis not present

## 2021-07-10 DIAGNOSIS — G8929 Other chronic pain: Secondary | ICD-10-CM | POA: Diagnosis not present

## 2021-07-10 DIAGNOSIS — M6281 Muscle weakness (generalized): Secondary | ICD-10-CM

## 2021-07-10 DIAGNOSIS — M25561 Pain in right knee: Secondary | ICD-10-CM

## 2021-07-10 DIAGNOSIS — Z96651 Presence of right artificial knee joint: Secondary | ICD-10-CM

## 2021-07-10 DIAGNOSIS — R600 Localized edema: Secondary | ICD-10-CM | POA: Diagnosis not present

## 2021-07-10 MED ORDER — HYDROCODONE-ACETAMINOPHEN 5-325 MG PO TABS: 1.0000 | ORAL_TABLET | Freq: Four times a day (QID) | ORAL | 0 refills | Status: DC | PRN

## 2021-07-10 NOTE — Progress Notes (Signed)
The patient is now 6-week status post a right total knee arthroplasty.  She is a very active 70 year old female and is still very frustrated about the progress that she is made thus far.  She is still having a burning pain and having most difficulty getting sleep at night.  She is walking without assistive device.  She said her therapist been able to flex her to 95 degrees.  There is still mild to moderate swelling of her right knee to be expected.  Her extension is actually good and her flexion is just past 95 degrees in the office today.  The knee feels ligamentously stable.  I gave her reassurance that she is still making good progress.  I want her to take 300 mg of Neurontin just at bedtime as well as 800 mg of ibuprofen.  I will switch her to hydrocodone from oxycodone because I think the combination of hydrocodone with Tylenol will certainly help as well.  She will continue to push herself to range of motion and hopefully with time this will also improve and I am encouraged by the visit today.  We will reevaluate in 4 weeks.  No x-rays are needed.

## 2021-07-10 NOTE — Therapy (Signed)
[facility] [address] [place], [place], [address] Phone: [phone]   Fax:  [phone]  Physical Therapy Treatment  Patient Details  Name: Kathleen Peters MRN: [id number] Date of Birth: 03-24-1951 Referring Provider (PT): Mcarthur Rossetti, MD   Encounter Date: 07/10/2021   PT End of Session - 07/10/21 1428     Visit Number 8    Number of Visits 20    Date for PT Re-Evaluation 08/21/21    Authorization Type [employer] $10 copay    Authorization Time Period through 08/21/2021    Authorization - Visit Number 8    Authorization - Number of Visits 12    Progress Note Due on Visit 10    PT Start Time 9528    PT Stop Time 1518    PT Time Calculation (min) 50 min    Activity Tolerance Patient tolerated treatment well   similar overall pain reporting   Behavior During Therapy WFL for tasks assessed/performed             Past Medical History:  Diagnosis Date   Arthritis    Back pain    Cancer ([place])    hx of skin cancer   Graves disease    Hyperthyroidism    Insomnia    Migraines     Past Surgical History:  Procedure Laterality Date   ABDOMINAL HYSTERECTOMY     ELBOW FRACTURE SURGERY     LAPAROSCOPIC APPENDECTOMY N/A 01/29/2021   Procedure: APPENDECTOMY LAPAROSCOPIC;  Surgeon: Clovis Riley, MD;  Location: [facility];  Service: General;  Laterality: N/A;   TONSILLECTOMY     TOTAL KNEE ARTHROPLASTY Right 05/30/2021   Procedure: RIGHT TOTAL KNEE ARTHROPLASTY;  Surgeon: Mcarthur Rossetti, MD;  Location: [facility];  Service: Orthopedics;  Laterality: Right;   TYMPANOSTOMY TUBE PLACEMENT      There were no vitals filed for this visit.   Subjective Assessment - 07/10/21 1431     Subjective Pt. indicated she can't get relief from that same pain in inside of knee.  Pt. stated she woke up at night with a burn feel that was painful.    Limitations Sitting;Lifting;Standing;Walking;House hold activities    Patient Stated Goals  Reduce pain, walk normally    Currently in Pain? Yes    Pain Score 3     Pain Location Knee    Pain Orientation Right;Medial    Pain Descriptors / Indicators Tightness;Sore;Aching    Pain Type Surgical pain    Pain Onset More than a month ago   surgery   Pain Frequency Constant    Aggravating Factors  end range, nighttime pain noted, pressure at times    Pain Relieving Factors resting at times helps, medicine sometimes                               [facility] Adult PT Treatment/Exercise - 07/10/21 0001       Ambulation/Gait   Gait Comments fwd/ reverse ambulation c focus on increased WB time, heel/toe transitioning and knee flexion in swing.      Knee/Hip Exercises: Stretches   Gastroc Stretch 3 reps;30 seconds;Both   incline board     Knee/Hip Exercises: Aerobic   Recumbent Bike Partial circles 7 mins, seat 6      Knee/Hip Exercises: Machines for Strengthening   Total Gym Leg Press single leg 31 lbs 2 x 10 performed bilateral  Knee/Hip Exercises: Seated   Long Arc Quad Right;3 sets;10 reps   pause in flexion/extension, contralateral leg movement opposite   Long Arc Quad Weight 5 lbs.      Knee/Hip Exercises: Supine   Other Supine Knee/Hip Exercises isometric painfree holds 5 sec on/off 2 mins in 60 deg, 90 deg      Vasopneumatic   Number Minutes Vasopneumatic  10 minutes    Vasopnuematic Location  Knee    Vasopneumatic Pressure Medium    Vasopneumatic Temperature  34                       PT Short Term Goals - 06/26/21 1242       PT SHORT TERM GOAL #1   Title Patient will demonstrate independent use of home exercise program to maintain progress from in clinic treatments.    Time 3    Period Weeks    Status On-going    Target Date 07/03/21               PT Long Term Goals - 06/12/21 1457       PT LONG TERM GOAL #1   Title Patient will demonstrate/report pain at worst less than or equal to 2/10 to facilitate minimal  limitation in daily activity secondary to pain symptoms.    Time 10    Period Weeks    Status New    Target Date 08/21/21      PT LONG TERM GOAL #2   Title Patient will demonstrate independent use of home exercise program to facilitate ability to maintain/progress functional gains from skilled physical therapy services.    Time 10    Period Weeks    Status New    Target Date 08/21/21      PT LONG TERM GOAL #3   Title Pt. will demonstrate FOTO outcome > or = 72% to indicated reduced disability due to condition.    Time 10    Period Weeks    Status New    Target Date 08/21/21      PT LONG TERM GOAL #4   Title Patient will demonstrate Rt knee AROM 0-110 degrees to facilitate ability to perform transfers, sitting, ambulation, stair navigation s restriction due to mobility.    Time 10    Period Weeks    Status New    Target Date 08/21/21      PT LONG TERM GOAL #5   Title Patient will demonstrate Rt LE MMT 5/5 throughout to facilitate ability to perform usual standing, walking, stairs at PLOF s limitation due to symptoms.    Time 10    Period Weeks    Status New    Target Date 08/21/21      Additional Long Term Goals   Additional Long Term Goals Yes      PT LONG TERM GOAL #6   Title Pt. will demonstrate bilateral SLS > 15 seconds to facilitate stability in ambulation on even and uneven surfaces.    Time 10    Period Weeks    Status New    Target Date 08/21/21      PT LONG TERM GOAL #7   Title Pt. will demonstrate reciprocal gait pattern up/down 3 stairs s handrail for household entry.    Time 10    Period Weeks    Status New    Target Date 08/21/21  Plan - 07/10/21 1453     Clinical Impression Statement Overall symptom report similar with arrival today.  Pt. was able to continue c established exercise plan c goals of strengthening and mobility gains. Quality in isometric holds was improved today vs. previous.    Personal Factors and  Comorbidities Comorbidity 2    Comorbidities History of skin cancer, Hyperthyroidism    Examination-Activity Limitations Sit;Sleep;Bed Mobility;Squat;Bend;Stairs;Stand;Transfers;Locomotion Level    Examination-Participation Restrictions Community Activity;Driving;Cleaning;Laundry;Meal Prep;Other   walking, golf   Stability/Clinical Decision Making Stable/Uncomplicated    Rehab Potential Good    PT Frequency 2x / week    PT Duration Other (comment)   10 weeks   PT Treatment/Interventions ADLs/Self Care Home Management;Cryotherapy;Electrical Stimulation;Iontophoresis 4mg /ml Dexamethasone;Moist Heat;Balance training;Therapeutic exercise;Therapeutic activities;Functional mobility training;Stair training;Ultrasound;Neuromuscular re-education;Patient/family education;Passive range of motion;Spinal Manipulations;Joint Manipulations;Dry needling;Taping;Vasopneumatic Device;Manual techniques    PT Next Visit Plan Continue plan with incremental increases as tolerated based off symptoms.  Incorporate functional balance/ambulation improvements    PT Home Exercise Plan [id number]    Consulted and Agree with Plan of Care Patient             Patient will benefit from skilled therapeutic intervention in order to improve the following deficits and impairments:  Abnormal gait, Decreased endurance, Hypomobility, Increased edema, Decreased activity tolerance, Decreased strength, Pain, Decreased balance, Decreased mobility, Difficulty walking, Impaired perceived functional ability, Improper body mechanics, Impaired flexibility, Decreased coordination, Decreased range of motion  Visit Diagnosis: Chronic pain of right knee  Muscle weakness (generalized)  Difficulty in walking, not elsewhere classified  Localized edema     Problem List Patient Active Problem List   Diagnosis Date Noted   Status post total right knee replacement 05/30/2021   Unilateral primary osteoarthritis, right knee 05/29/2021    Unilateral primary osteoarthritis, left knee 04/01/2021   Acute appendicitis 01/28/2021   Hyperthyroidism 04/14/2020   Heterozygous factor V Leiden mutation ([place]) 09/23/2015   Lupus anticoagulant positive 09/23/2015   Varicose veins of both lower extremities 09/23/2015   Thrombophlebitis of superficial veins of right lower extremity 04/24/2014    Scot Jun, PT, DPT, OCS, ATC 07/10/21  3:09 PM    [facility] [address] [place], [place], [address] Phone: [phone]   Fax:  [phone]  Name: Kathleen Peters MRN: [id number] Date of Birth: May 10, 1951

## 2021-07-15 ENCOUNTER — Encounter: Payer: Medicare HMO | Admitting: Rehabilitative and Restorative Service Providers"

## 2021-07-16 ENCOUNTER — Other Ambulatory Visit: Payer: Self-pay

## 2021-07-16 ENCOUNTER — Encounter: Payer: Self-pay | Admitting: Rehabilitative and Restorative Service Providers"

## 2021-07-16 ENCOUNTER — Ambulatory Visit (INDEPENDENT_AMBULATORY_CARE_PROVIDER_SITE_OTHER): Payer: Medicare HMO | Admitting: Rehabilitative and Restorative Service Providers"

## 2021-07-16 DIAGNOSIS — M25561 Pain in right knee: Secondary | ICD-10-CM

## 2021-07-16 DIAGNOSIS — M6281 Muscle weakness (generalized): Secondary | ICD-10-CM

## 2021-07-16 DIAGNOSIS — R600 Localized edema: Secondary | ICD-10-CM | POA: Diagnosis not present

## 2021-07-16 DIAGNOSIS — G8929 Other chronic pain: Secondary | ICD-10-CM

## 2021-07-16 DIAGNOSIS — R262 Difficulty in walking, not elsewhere classified: Secondary | ICD-10-CM

## 2021-07-16 NOTE — Therapy (Signed)
[facility] [address] [place], [place], [address] Phone: [phone]   Fax:  [phone]  Physical Therapy Treatment  Patient Details  Name: Kathleen Peters MRN: [id number] Date of Birth: 1951-06-17 Referring Provider (PT): Mcarthur Rossetti, MD   Encounter Date: 07/16/2021   PT End of Session - 07/16/21 1430     Visit Number 9    Number of Visits 20    Date for PT Re-Evaluation 08/21/21    Authorization Type [employer] $10 copay    Authorization Time Period through 08/21/2021    Authorization - Visit Number 9    Authorization - Number of Visits 12    Progress Note Due on Visit 10    PT Start Time 0623    PT Stop Time 7628    PT Time Calculation (min) 51 min    Activity Tolerance Patient tolerated treatment well   similar overall pain reporting   Behavior During Therapy WFL for tasks assessed/performed             Past Medical History:  Diagnosis Date   Arthritis    Back pain    Cancer ([place])    hx of skin cancer   Graves disease    Hyperthyroidism    Insomnia    Migraines     Past Surgical History:  Procedure Laterality Date   ABDOMINAL HYSTERECTOMY     ELBOW FRACTURE SURGERY     LAPAROSCOPIC APPENDECTOMY N/A 01/29/2021   Procedure: APPENDECTOMY LAPAROSCOPIC;  Surgeon: Clovis Riley, MD;  Location: [facility];  Service: General;  Laterality: N/A;   TONSILLECTOMY     TOTAL KNEE ARTHROPLASTY Right 05/30/2021   Procedure: RIGHT TOTAL KNEE ARTHROPLASTY;  Surgeon: Mcarthur Rossetti, MD;  Location: [facility];  Service: Orthopedics;  Laterality: Right;   TYMPANOSTOMY TUBE PLACEMENT      There were no vitals filed for this visit.   Subjective Assessment - 07/16/21 1428     Subjective Pt. indicated feeling a bit better in last few days.  Pt. indicated feeling more today upon arrival.    Limitations Sitting;Lifting;Standing;Walking;House hold activities    Patient Stated Goals Reduce pain, walk normally    Currently in  Pain? Yes    Pain Score 3     Pain Location Knee    Pain Orientation Right    Pain Descriptors / Indicators Aching;Constant;Sore;Tightness    Pain Type Surgical pain    Pain Onset More than a month ago   surgery   Pain Frequency Constant    Aggravating Factors  woke up feeling it    Pain Relieving Factors nothing specific reported for today                [facility] PT Assessment - 07/16/21 0001       Assessment   Medical Diagnosis M17.11 (ICD-10-CM) - Unilateral primary osteoarthritis, right knee  Z96.651 (ICD-10-CM) - Status post total right knee replacement    Referring Provider (PT) Mcarthur Rossetti, MD    Onset Date/Surgical Date 05/30/21    Hand Dominance Right                           [facility] Adult PT Treatment/Exercise - 07/16/21 0001       Knee/Hip Exercises: Stretches   Gastroc Stretch 30 seconds;5 reps;Both   incilne board     Knee/Hip Exercises: Aerobic   Nustep Lvl 6 10 mins UE/LE  Knee/Hip Exercises: Machines for Strengthening   Total Gym Leg Press single leg Rt 31 lbs 3 x 10      Knee/Hip Exercises: Standing   Forward Step Up Step Height: 4";2 sets;10 reps;Both   TKE focus on stance     Knee/Hip Exercises: Seated   Long Arc Quad Right;2 sets;10 reps   pause in end ranges for stretching   Long Arc Quad Weight 6 lbs.    Other Seated Knee/Hip Exercises seated isometric alternating flexion/ext 5 sec each 2 mins in mid range Rt leg      Knee/Hip Exercises: Supine   Other Supine Knee/Hip Exercises --      Vasopneumatic   Number Minutes Vasopneumatic  10 minutes    Vasopnuematic Location  Knee    Vasopneumatic Pressure Medium    Vasopneumatic Temperature  34                       PT Short Term Goals - 06/26/21 1242       PT SHORT TERM GOAL #1   Title Patient will demonstrate independent use of home exercise program to maintain progress from in clinic treatments.    Time 3    Period Weeks    Status On-going     Target Date 07/03/21               PT Long Term Goals - 07/16/21 1443       PT LONG TERM GOAL #1   Title Patient will demonstrate/report pain at worst less than or equal to 2/10 to facilitate minimal limitation in daily activity secondary to pain symptoms.    Time 10    Period Weeks    Status On-going    Target Date 08/21/21      PT LONG TERM GOAL #2   Title Patient will demonstrate independent use of home exercise program to facilitate ability to maintain/progress functional gains from skilled physical therapy services.    Time 10    Period Weeks    Status On-going    Target Date 08/21/21      PT LONG TERM GOAL #3   Title Pt. will demonstrate FOTO outcome > or = 72% to indicated reduced disability due to condition.    Time 10    Period Weeks    Status On-going    Target Date 08/21/21      PT LONG TERM GOAL #4   Title Patient will demonstrate Rt knee AROM 0-110 degrees to facilitate ability to perform transfers, sitting, ambulation, stair navigation s restriction due to mobility.    Time 10    Period Weeks    Status On-going    Target Date 08/21/21      PT LONG TERM GOAL #5   Title Patient will demonstrate Rt LE MMT 5/5 throughout to facilitate ability to perform usual standing, walking, stairs at PLOF s limitation due to symptoms.    Time 10    Period Weeks    Status On-going    Target Date 08/21/21      PT LONG TERM GOAL #6   Title Pt. will demonstrate bilateral SLS > 15 seconds to facilitate stability in ambulation on even and uneven surfaces.    Time 10    Period Weeks    Status On-going    Target Date 08/21/21      PT LONG TERM GOAL #7   Title Pt. will demonstrate reciprocal gait pattern up/down 3 stairs s handrail  for household entry.    Time 10    Period Weeks    Status On-going    Target Date 08/21/21                   Plan - 07/16/21 1445     Clinical Impression Statement Pt. continued to ambulate into clinic c reduced knee flexion  in swing (improved c cues verbally).  Pt. was able to tolerate performing increased strengthening in stair and leg press movements today.  Discussion about not using treadmill for ambulation at this time (in part due to antalgic gait).    Personal Factors and Comorbidities Comorbidity 2    Comorbidities History of skin cancer, Hyperthyroidism    Examination-Activity Limitations Sit;Sleep;Bed Mobility;Squat;Bend;Stairs;Stand;Transfers;Locomotion Level    Examination-Participation Restrictions Community Activity;Driving;Cleaning;Laundry;Meal Prep;Other   walking, golf   Stability/Clinical Decision Making Stable/Uncomplicated    Rehab Potential Good    PT Frequency 2x / week    PT Duration Other (comment)   10 weeks   PT Treatment/Interventions ADLs/Self Care Home Management;Cryotherapy;Electrical Stimulation;Iontophoresis 4mg /ml Dexamethasone;Moist Heat;Balance training;Therapeutic exercise;Therapeutic activities;Functional mobility training;Stair training;Ultrasound;Neuromuscular re-education;Patient/family education;Passive range of motion;Spinal Manipulations;Joint Manipulations;Dry needling;Taping;Vasopneumatic Device;Manual techniques    PT Next Visit Plan Continue plan with incremental increases as tolerated based off symptoms continue to challenge quad strength as able.    PT Home Exercise Plan [id number]    Consulted and Agree with Plan of Care Patient             Patient will benefit from skilled therapeutic intervention in order to improve the following deficits and impairments:  Abnormal gait, Decreased endurance, Hypomobility, Increased edema, Decreased activity tolerance, Decreased strength, Pain, Decreased balance, Decreased mobility, Difficulty walking, Impaired perceived functional ability, Improper body mechanics, Impaired flexibility, Decreased coordination, Decreased range of motion  Visit Diagnosis: Chronic pain of right knee  Muscle weakness (generalized)  Difficulty in  walking, not elsewhere classified  Localized edema     Problem List Patient Active Problem List   Diagnosis Date Noted   Status post total right knee replacement 05/30/2021   Unilateral primary osteoarthritis, right knee 05/29/2021   Unilateral primary osteoarthritis, left knee 04/01/2021   Acute appendicitis 01/28/2021   Hyperthyroidism 04/14/2020   Heterozygous factor V Leiden mutation ([place]) 09/23/2015   Lupus anticoagulant positive 09/23/2015   Varicose veins of both lower extremities 09/23/2015   Thrombophlebitis of superficial veins of right lower extremity 04/24/2014   Scot Jun, PT, DPT, OCS, ATC 07/16/21  3:02 PM    [facility] [address] [place], [place], [address] Phone: [phone]   Fax:  [phone]  Name: BRYLEA PITA MRN: [id number] Date of Birth: June 09, 1951

## 2021-07-21 ENCOUNTER — Telehealth: Payer: Self-pay | Admitting: *Deleted

## 2021-07-21 NOTE — Telephone Encounter (Signed)
Patient called this morning and states she has had several terrible days. Pain described as "worse than post-op; can't touch the knee; pain with every step". She is taking the Gabapentin as well as the Ibuprofen and Hydrocodone as needed. She continues to attend therapy and has 2 sessions this week. She says her pain has increased since last visit. Would you like to see her back? Any other recommendations?

## 2021-07-22 ENCOUNTER — Other Ambulatory Visit: Payer: Self-pay

## 2021-07-22 ENCOUNTER — Ambulatory Visit: Payer: Medicare HMO | Admitting: Physical Therapy

## 2021-07-22 DIAGNOSIS — R262 Difficulty in walking, not elsewhere classified: Secondary | ICD-10-CM

## 2021-07-22 DIAGNOSIS — M25561 Pain in right knee: Secondary | ICD-10-CM

## 2021-07-22 DIAGNOSIS — R600 Localized edema: Secondary | ICD-10-CM | POA: Diagnosis not present

## 2021-07-22 DIAGNOSIS — M6281 Muscle weakness (generalized): Secondary | ICD-10-CM | POA: Diagnosis not present

## 2021-07-22 DIAGNOSIS — G8929 Other chronic pain: Secondary | ICD-10-CM | POA: Diagnosis not present

## 2021-07-22 NOTE — Therapy (Signed)
[facility] [address] [place], [place], [address] Phone: [phone]   Fax:  [phone]  Physical Therapy Treatment/10th visit progress note Progress Note reporting period date 06/12/21 to 07/22/21  See below for objective and subjective measurements relating to patients progress with PT.   Patient Details  Name: Kathleen Peters MRN: [id number] Date of Birth: 02-24-1951 Referring Provider (PT): Mcarthur Rossetti, MD   Encounter Date: 07/22/2021   PT End of Session - 07/22/21 1306     Visit Number 10    Number of Visits 20    Date for PT Re-Evaluation 08/21/21    Authorization Type [employer] $10 copay    Authorization Time Period through 08/21/2021    Authorization - Visit Number 10    Authorization - Number of Visits 12    Progress Note Due on Visit 33    PT Start Time 1761    PT Stop Time 6073    PT Time Calculation (min) 50 min    Activity Tolerance Patient tolerated treatment well   similar overall pain reporting   Behavior During Therapy WFL for tasks assessed/performed             Past Medical History:  Diagnosis Date   Arthritis    Back pain    Cancer ([place])    hx of skin cancer   Graves disease    Hyperthyroidism    Insomnia    Migraines     Past Surgical History:  Procedure Laterality Date   ABDOMINAL HYSTERECTOMY     ELBOW FRACTURE SURGERY     LAPAROSCOPIC APPENDECTOMY N/A 01/29/2021   Procedure: APPENDECTOMY LAPAROSCOPIC;  Surgeon: Clovis Riley, MD;  Location: [facility];  Service: General;  Laterality: N/A;   TONSILLECTOMY     TOTAL KNEE ARTHROPLASTY Right 05/30/2021   Procedure: RIGHT TOTAL KNEE ARTHROPLASTY;  Surgeon: Mcarthur Rossetti, MD;  Location: [facility];  Service: Orthopedics;  Laterality: Right;   TYMPANOSTOMY TUBE PLACEMENT      There were no vitals filed for this visit.   Subjective Assessment - 07/22/21 1231     Subjective she relays she has had a tough few days with lots of pain  around her lateral knee and difficulty walking.    Limitations Sitting;Lifting;Standing;Walking;House hold activities    Patient Stated Goals Reduce pain, walk normally    Pain Onset More than a month ago   surgery               [facility] PT Assessment - 07/22/21 0001       Assessment   Medical Diagnosis M17.11 (ICD-10-CM) - Unilateral primary osteoarthritis, right knee  Z96.651 (ICD-10-CM) - Status post total right knee replacement    Referring Provider (PT) Mcarthur Rossetti, MD    Onset Date/Surgical Date 05/30/21                           [facility] Adult PT Treatment/Exercise - 07/22/21 0001       Knee/Hip Exercises: Stretches   Knee: Self-Stretch Limitations seated tailgate stretch 5 sec X15      Knee/Hip Exercises: Seated   Long Arc Quad Right;2 sets;10 reps    Long Arc Quad Weight 3 lbs.    Sit to Sand 10 reps;without UE support   slightly raised mat height taller than chair     Modalities   Modalities Energy manager  Rt knee    Electrical Stimulation Action Pre mod X10 min    Electrical Stimulation Parameters to tolerance    Electrical Stimulation Goals Pain      Vasopneumatic   Number Minutes Vasopneumatic  10 minutes    Vasopnuematic Location  Knee    Vasopneumatic Pressure Medium    Vasopneumatic Temperature  34      Manual Therapy   Manual therapy comments Rt knee PROM with overpressure to tolerance                       PT Short Term Goals - 06/26/21 1242       PT SHORT TERM GOAL #1   Title Patient will demonstrate independent use of home exercise program to maintain progress from in clinic treatments.    Time 3    Period Weeks    Status On-going    Target Date 07/03/21               PT Long Term Goals - 07/16/21 1443       PT LONG TERM GOAL #1   Title Patient will demonstrate/report pain at worst less than or equal to 2/10 to  facilitate minimal limitation in daily activity secondary to pain symptoms.    Time 10    Period Weeks    Status On-going    Target Date 08/21/21      PT LONG TERM GOAL #2   Title Patient will demonstrate independent use of home exercise program to facilitate ability to maintain/progress functional gains from skilled physical therapy services.    Time 10    Period Weeks    Status On-going    Target Date 08/21/21      PT LONG TERM GOAL #3   Title Pt. will demonstrate FOTO outcome > or = 72% to indicated reduced disability due to condition.    Time 10    Period Weeks    Status On-going    Target Date 08/21/21      PT LONG TERM GOAL #4   Title Patient will demonstrate Rt knee AROM 0-110 degrees to facilitate ability to perform transfers, sitting, ambulation, stair navigation s restriction due to mobility.    Time 10    Period Weeks    Status On-going    Target Date 08/21/21      PT LONG TERM GOAL #5   Title Patient will demonstrate Rt LE MMT 5/5 throughout to facilitate ability to perform usual standing, walking, stairs at PLOF s limitation due to symptoms.    Time 10    Period Weeks    Status On-going    Target Date 08/21/21      PT LONG TERM GOAL #6   Title Pt. will demonstrate bilateral SLS > 15 seconds to facilitate stability in ambulation on even and uneven surfaces.    Time 10    Period Weeks    Status On-going    Target Date 08/21/21      PT LONG TERM GOAL #7   Title Pt. will demonstrate reciprocal gait pattern up/down 3 stairs s handrail for household entry.    Time 10    Period Weeks    Status On-going    Target Date 08/21/21                   Plan - 07/22/21 1307     Clinical Impression Statement She has more overall knee pain over the last week. I  examined her knee an she is not having a lot of swelling, warmth, or redness so I feel nothing is alarming right now and she likely just has some inflammation and irritation with maybe some nerve pain. We  used TENS therapy to calm this down some and we just did some light exercise and ROM today. I let her know that less is better right now until the inflammation improves and as it calms down then we will work toward more activity and strengthening.  I still feel she is at an okay point of progress post op but progress has been slower due to pain. I did not update measurments today for progress note and instead focused on pain control and rehab guidance this session. She will continue to benefit from skilled PT and we will update measuments next visit.    Personal Factors and Comorbidities Comorbidity 2    Comorbidities History of skin cancer, Hyperthyroidism    Examination-Activity Limitations Sit;Sleep;Bed Mobility;Squat;Bend;Stairs;Stand;Transfers;Locomotion Level    Examination-Participation Restrictions Community Activity;Driving;Cleaning;Laundry;Meal Prep;Other   walking, golf   Stability/Clinical Decision Making Stable/Uncomplicated    Rehab Potential Good    PT Frequency 2x / week    PT Duration Other (comment)   10 weeks   PT Treatment/Interventions ADLs/Self Care Home Management;Cryotherapy;Electrical Stimulation;Iontophoresis 4mg /ml Dexamethasone;Moist Heat;Balance training;Therapeutic exercise;Therapeutic activities;Functional mobility training;Stair training;Ultrasound;Neuromuscular re-education;Patient/family education;Passive range of motion;Spinal Manipulations;Joint Manipulations;Dry needling;Taping;Vasopneumatic Device;Manual techniques    PT Next Visit Plan calm down pain and inflammation then gentle exercise progression as tolerated    PT Home Exercise Plan [id number]    Consulted and Agree with Plan of Care Patient             Patient will benefit from skilled therapeutic intervention in order to improve the following deficits and impairments:  Abnormal gait, Decreased endurance, Hypomobility, Increased edema, Decreased activity tolerance, Decreased strength, Pain, Decreased  balance, Decreased mobility, Difficulty walking, Impaired perceived functional ability, Improper body mechanics, Impaired flexibility, Decreased coordination, Decreased range of motion  Visit Diagnosis: Chronic pain of right knee  Muscle weakness (generalized)  Difficulty in walking, not elsewhere classified  Localized edema     Problem List Patient Active Problem List   Diagnosis Date Noted   Status post total right knee replacement 05/30/2021   Unilateral primary osteoarthritis, right knee 05/29/2021   Unilateral primary osteoarthritis, left knee 04/01/2021   Acute appendicitis 01/28/2021   Hyperthyroidism 04/14/2020   Heterozygous factor V Leiden mutation ([place]) 09/23/2015   Lupus anticoagulant positive 09/23/2015   Varicose veins of both lower extremities 09/23/2015   Thrombophlebitis of superficial veins of right lower extremity 04/24/2014    Debbe Odea, PT,DPT 07/22/2021, 1:17 PM  [facility] [address] [place], [place], [address] Phone: [phone]   Fax:  [phone]  Name: Kathleen Peters MRN: [id number] Date of Birth: August 26, 1950

## 2021-07-24 ENCOUNTER — Other Ambulatory Visit: Payer: Self-pay

## 2021-07-24 ENCOUNTER — Ambulatory Visit: Payer: Medicare HMO | Admitting: Physical Therapy

## 2021-07-24 DIAGNOSIS — M25561 Pain in right knee: Secondary | ICD-10-CM

## 2021-07-24 DIAGNOSIS — R600 Localized edema: Secondary | ICD-10-CM

## 2021-07-24 DIAGNOSIS — R262 Difficulty in walking, not elsewhere classified: Secondary | ICD-10-CM | POA: Diagnosis not present

## 2021-07-24 DIAGNOSIS — M6281 Muscle weakness (generalized): Secondary | ICD-10-CM | POA: Diagnosis not present

## 2021-07-24 DIAGNOSIS — G8929 Other chronic pain: Secondary | ICD-10-CM

## 2021-07-24 NOTE — Therapy (Signed)
[facility] [address] [place], [place], [address] Phone: [phone]   Fax:  [phone]  Physical Therapy Treatment  Patient Details  Name: Kathleen Peters MRN: [id number] Date of Birth: 1951/06/06 Referring Provider (PT): Mcarthur Rossetti, MD   Encounter Date: 07/24/2021   PT End of Session - 07/24/21 1534     Visit Number 11    Number of Visits 20    Date for PT Re-Evaluation 08/21/21    Authorization Type [employer] $10 copay    Authorization Time Period through 08/21/2021    Authorization - Visit Number 11    Authorization - Number of Visits 12    Progress Note Due on Visit 36    PT Start Time 6203    PT Stop Time 5597    PT Time Calculation (min) 52 min    Activity Tolerance Patient tolerated treatment well   similar overall pain reporting   Behavior During Therapy WFL for tasks assessed/performed             Past Medical History:  Diagnosis Date   Arthritis    Back pain    Cancer ([place])    hx of skin cancer   Graves disease    Hyperthyroidism    Insomnia    Migraines     Past Surgical History:  Procedure Laterality Date   ABDOMINAL HYSTERECTOMY     ELBOW FRACTURE SURGERY     LAPAROSCOPIC APPENDECTOMY N/A 01/29/2021   Procedure: APPENDECTOMY LAPAROSCOPIC;  Surgeon: Clovis Riley, MD;  Location: [facility];  Service: General;  Laterality: N/A;   TONSILLECTOMY     TOTAL KNEE ARTHROPLASTY Right 05/30/2021   Procedure: RIGHT TOTAL KNEE ARTHROPLASTY;  Surgeon: Mcarthur Rossetti, MD;  Location: [facility];  Service: Orthopedics;  Laterality: Right;   TYMPANOSTOMY TUBE PLACEMENT      There were no vitals filed for this visit.   Subjective Assessment - 07/24/21 1533     Subjective relays about 12/27/08 pain today in her knee, she got a home TENS unit and would like to make sure she has it set up properly    Limitations Sitting;Lifting;Standing;Walking;House hold activities    Patient Stated Goals Reduce pain, walk  normally    Pain Onset More than a month ago   surgery             [facility] Adult PT Treatment/Exercise - 07/24/21 0001       Knee/Hip Exercises: Stretches   Knee: Self-Stretch Limitations seated tailgate stretch 5 sec X15    Gastroc Stretch Both;3 reps;30 seconds    Gastroc Stretch Limitations slantboard      Knee/Hip Exercises: Aerobic   Nustep Lvl 5 10 mins UE/LE      Knee/Hip Exercises: Standing   Lateral Step Up Right;10 reps;Hand Hold: 1;Step Height: 4"    Forward Step Up Right;10 reps;Hand Hold: 1;Step Height: 4"      Knee/Hip Exercises: Seated   Sit to [employer] 2 sets;5 reps   chair with airex pad     Acupuncturist Location Rt knee    Electrical Stimulation Action pre mod  X12 min    Electrical Stimulation Parameters to tolerance    Electrical Stimulation Goals Pain      Vasopneumatic   Number Minutes Vasopneumatic  10 minutes    Vasopnuematic Location  Knee    Vasopneumatic Pressure Medium    Vasopneumatic Temperature  34  PT Short Term Goals - 06/26/21 1242       PT SHORT TERM GOAL #1   Title Patient will demonstrate independent use of home exercise program to maintain progress from in clinic treatments.    Time 3    Period Weeks    Status On-going    Target Date 07/03/21               PT Long Term Goals - 07/16/21 1443       PT LONG TERM GOAL #1   Title Patient will demonstrate/report pain at worst less than or equal to 2/10 to facilitate minimal limitation in daily activity secondary to pain symptoms.    Time 10    Period Weeks    Status On-going    Target Date 08/21/21      PT LONG TERM GOAL #2   Title Patient will demonstrate independent use of home exercise program to facilitate ability to maintain/progress functional gains from skilled physical therapy services.    Time 10    Period Weeks    Status On-going    Target Date 08/21/21      PT LONG TERM GOAL #3   Title  Pt. will demonstrate FOTO outcome > or = 72% to indicated reduced disability due to condition.    Time 10    Period Weeks    Status On-going    Target Date 08/21/21      PT LONG TERM GOAL #4   Title Patient will demonstrate Rt knee AROM 0-110 degrees to facilitate ability to perform transfers, sitting, ambulation, stair navigation s restriction due to mobility.    Time 10    Period Weeks    Status On-going    Target Date 08/21/21      PT LONG TERM GOAL #5   Title Patient will demonstrate Rt LE MMT 5/5 throughout to facilitate ability to perform usual standing, walking, stairs at PLOF s limitation due to symptoms.    Time 10    Period Weeks    Status On-going    Target Date 08/21/21      PT LONG TERM GOAL #6   Title Pt. will demonstrate bilateral SLS > 15 seconds to facilitate stability in ambulation on even and uneven surfaces.    Time 10    Period Weeks    Status On-going    Target Date 08/21/21      PT LONG TERM GOAL #7   Title Pt. will demonstrate reciprocal gait pattern up/down 3 stairs s handrail for household entry.    Time 10    Period Weeks    Status On-going    Target Date 08/21/21                   Plan - 07/24/21 1609     Clinical Impression Statement She had less pain and more activity tolreance from last session, she feels the TENS therapy helped. She did get a home unit but is unsure if it works as well. I spent time showing her how to set this up and to try different programs that hers has to see if any of those are more effective. Her unit only has a 2 pad set up so I advised her that if she does not get relief from her unit to return it and try one that has 4 pad set up. We then worked on gentle ROM and strengthening, we attemped march walking to further increase her hip/knee flexion during swing  phase of gait however this was painful for her upon extending her knee back down so we discontinued this.  We will update her measumrents for humana recert  next visit.    Personal Factors and Comorbidities Comorbidity 2    Comorbidities History of skin cancer, Hyperthyroidism    Examination-Activity Limitations Sit;Sleep;Bed Mobility;Squat;Bend;Stairs;Stand;Transfers;Locomotion Level    Examination-Participation Restrictions Community Activity;Driving;Cleaning;Laundry;Meal Prep;Other   walking, golf   Stability/Clinical Decision Making Stable/Uncomplicated    Rehab Potential Good    PT Frequency 2x / week    PT Duration Other (comment)   10 weeks   PT Treatment/Interventions ADLs/Self Care Home Management;Cryotherapy;Electrical Stimulation;Iontophoresis 4mg /ml Dexamethasone;Moist Heat;Balance training;Therapeutic exercise;Therapeutic activities;Functional mobility training;Stair training;Ultrasound;Neuromuscular re-education;Patient/family education;Passive range of motion;Spinal Manipulations;Joint Manipulations;Dry needling;Taping;Vasopneumatic Device;Manual techniques    PT Next Visit Plan calm down pain and inflammation then gentle exercise progression as tolerated. Needs new humana recert    PT Home Exercise Plan [phone]    Consulted and Agree with Plan of Care Patient             Patient will benefit from skilled therapeutic intervention in order to improve the following deficits and impairments:  Abnormal gait, Decreased endurance, Hypomobility, Increased edema, Decreased activity tolerance, Decreased strength, Pain, Decreased balance, Decreased mobility, Difficulty walking, Impaired perceived functional ability, Improper body mechanics, Impaired flexibility, Decreased coordination, Decreased range of motion  Visit Diagnosis: Chronic pain of right knee  Muscle weakness (generalized)  Difficulty in walking, not elsewhere classified  Localized edema     Problem List Patient Active Problem List   Diagnosis Date Noted   Status post total right knee replacement 05/30/2021   Unilateral primary osteoarthritis, right knee  05/29/2021   Unilateral primary osteoarthritis, left knee 04/01/2021   Acute appendicitis 01/28/2021   Hyperthyroidism 04/14/2020   Heterozygous factor V Leiden mutation ([place]) 09/23/2015   Lupus anticoagulant positive 09/23/2015   Varicose veins of both lower extremities 09/23/2015   Thrombophlebitis of superficial veins of right lower extremity 04/24/2014    Debbe Odea, PT,DPT 07/24/2021, 4:14 PM  [facility] Physical Therapy [address] [place], [place], [address] Phone: [phone]   Fax:  [phone]  Name: Kathleen Peters MRN: [id number] Date of Birth: 11/20/1950

## 2021-07-29 DIAGNOSIS — E0590 Thyrotoxicosis, unspecified without thyrotoxic crisis or storm: Secondary | ICD-10-CM | POA: Diagnosis not present

## 2021-07-29 DIAGNOSIS — D649 Anemia, unspecified: Secondary | ICD-10-CM | POA: Diagnosis not present

## 2021-07-30 ENCOUNTER — Encounter: Payer: Self-pay | Admitting: Physical Therapy

## 2021-07-30 ENCOUNTER — Other Ambulatory Visit: Payer: Self-pay

## 2021-07-30 ENCOUNTER — Ambulatory Visit: Payer: Medicare HMO | Admitting: Physical Therapy

## 2021-07-30 DIAGNOSIS — G8929 Other chronic pain: Secondary | ICD-10-CM

## 2021-07-30 DIAGNOSIS — R600 Localized edema: Secondary | ICD-10-CM

## 2021-07-30 DIAGNOSIS — M6281 Muscle weakness (generalized): Secondary | ICD-10-CM | POA: Diagnosis not present

## 2021-07-30 DIAGNOSIS — R262 Difficulty in walking, not elsewhere classified: Secondary | ICD-10-CM

## 2021-07-30 DIAGNOSIS — M25561 Pain in right knee: Secondary | ICD-10-CM

## 2021-07-30 NOTE — Therapy (Signed)
[facility] [address] [place], [place], [address] Phone: [phone]   Fax:  [phone]  Physical Therapy Treatment  Patient Details  Name: Kathleen Peters MRN: [id number] Date of Birth: [birth year] Referring Provider (PT): Mcarthur Rossetti, MD   Encounter Date: 07/30/2021   PT End of Session - 07/30/21 1255     Visit Number 12    Number of Visits 20    Date for PT Re-Evaluation 08/21/21    Authorization Type [employer] $10 copay    Authorization Time Period through 08/21/2021    Authorization - Visit Number 12    Authorization - Number of Visits 12    Progress Note Due on Visit 20    PT Start Time 8127    PT Stop Time 1400    PT Time Calculation (min) 64 min    Activity Tolerance Patient tolerated treatment well   similar overall pain reporting   Behavior During Therapy WFL for tasks assessed/performed             Past Medical History:  Diagnosis Date   Arthritis    Back pain    Cancer ([place])    hx of skin cancer   Graves disease    Hyperthyroidism    Insomnia    Migraines     Past Surgical History:  Procedure Laterality Date   ABDOMINAL HYSTERECTOMY     ELBOW FRACTURE SURGERY     LAPAROSCOPIC APPENDECTOMY N/A 01/29/2021   Procedure: APPENDECTOMY LAPAROSCOPIC;  Surgeon: Clovis Riley, MD;  Location: [facility];  Service: General;  Laterality: N/A;   TONSILLECTOMY     TOTAL KNEE ARTHROPLASTY Right 05/30/2021   Procedure: RIGHT TOTAL KNEE ARTHROPLASTY;  Surgeon: Mcarthur Rossetti, MD;  Location: [facility];  Service: Orthopedics;  Laterality: Right;   TYMPANOSTOMY TUBE PLACEMENT      There were no vitals filed for this visit.   Subjective Assessment - 07/30/21 1259     Subjective Her knee continues to hurt with walking. Some of numbness is better.    Limitations Sitting;Lifting;Standing;Walking;House hold activities    Patient Stated Goals Reduce pain, walk normally    Currently in Pain? Yes    Pain Score 4     since last PT, lowest 0/10 to highest 8/10   Pain Location Knee    Pain Orientation Right    Pain Descriptors / Indicators Aching;Constant;Sore;Tightness    Pain Type Surgical pain    Pain Onset More than a month ago   surgery   Pain Frequency Intermittent    Aggravating Factors  walking    Pain Relieving Factors sitting or night when not using leg    Effect of Pain on Daily Activities walking                [facility] PT Assessment - 07/30/21 1300       Assessment   Medical Diagnosis M17.11 (ICD-10-CM) - Unilateral primary osteoarthritis, right knee  Z96.651 (ICD-10-CM) - Status post total right knee replacement    Referring Provider (PT) Mcarthur Rossetti, MD    Onset Date/Surgical Date 05/30/21    Hand Dominance Right      Observation/Other Assessments   Focus on Therapeutic Outcomes (FOTO)  43.3548   initial 38%, predicted 72%     AROM   Right Knee Extension 2   seated LAQ   Right Knee Flexion 92   standing antigravity 92*,  supine heel slide 106*     PROM   Right  Knee Extension -1   supine   Right Knee Flexion 111   supine                          [facility] Adult PT Treatment/Exercise - 07/30/21 1256       Neuro Re-ed    Neuro Re-ed Details  tandem stance on foam beam 1 min RLE in front & 1 min RLE in back intermittent touch for balance,      Knee/Hip Exercises: Stretches   Knee: Self-Stretch Limitations seated tailgate stretch 5 sec X15    Gastroc Stretch Both;3 reps;30 seconds    Gastroc Stretch Limitations slantboard      Knee/Hip Exercises: Aerobic   Nustep Lvl 5 10 mins seat 9 UE/LE      Knee/Hip Exercises: Standing   Heel Raises Both;1 set;10 reps;3 seconds    Heel Raises Limitations tennis ball squeeze to maintain LE alignment.    Lateral Step Up Right;10 reps;Hand Hold: 1;Step Height: 6"    Forward Step Up Right;10 reps;Hand Hold: 1;Step Height: 6"      Knee/Hip Exercises: Seated   Sit to [employer] 2 sets;5 reps   chair with airex pad      Electrical Stimulation   Electrical Stimulation Location Rt knee    Electrical Stimulation Action premod x 60min    Electrical Stimulation Parameters 11 on medial & lateral leads    Electrical Stimulation Goals Pain      Vasopneumatic   Number Minutes Vasopneumatic  10 minutes    Vasopnuematic Location  Knee    Vasopneumatic Pressure Medium    Vasopneumatic Temperature  34                       PT Short Term Goals - 07/30/21 1417       PT SHORT TERM GOAL #1   Title Patient will demonstrate independent use of home exercise program to maintain progress from in clinic treatments.    Time 3    Period Weeks    Status Achieved    Target Date 07/03/21               PT Long Term Goals - 07/16/21 1443       PT LONG TERM GOAL #1   Title Patient will demonstrate/report pain at worst less than or equal to 2/10 to facilitate minimal limitation in daily activity secondary to pain symptoms.    Time 10    Period Weeks    Status On-going    Target Date 08/21/21      PT LONG TERM GOAL #2   Title Patient will demonstrate independent use of home exercise program to facilitate ability to maintain/progress functional gains from skilled physical therapy services.    Time 10    Period Weeks    Status On-going    Target Date 08/21/21      PT LONG TERM GOAL #3   Title Pt. will demonstrate FOTO outcome > or = 72% to indicated reduced disability due to condition.    Time 10    Period Weeks    Status On-going    Target Date 08/21/21      PT LONG TERM GOAL #4   Title Patient will demonstrate Rt knee AROM 0-110 degrees to facilitate ability to perform transfers, sitting, ambulation, stair navigation s restriction due to mobility.    Time 10    Period Weeks    Status On-going  Target Date 08/21/21      PT LONG TERM GOAL #5   Title Patient will demonstrate Rt LE MMT 5/5 throughout to facilitate ability to perform usual standing, walking, stairs at PLOF s limitation  due to symptoms.    Time 10    Period Weeks    Status On-going    Target Date 08/21/21      PT LONG TERM GOAL #6   Title Pt. will demonstrate bilateral SLS > 15 seconds to facilitate stability in ambulation on even and uneven surfaces.    Time 10    Period Weeks    Status On-going    Target Date 08/21/21      PT LONG TERM GOAL #7   Title Pt. will demonstrate reciprocal gait pattern up/down 3 stairs s handrail for household entry.    Time 10    Period Weeks    Status On-going    Target Date 08/21/21                   Plan - 07/30/21 1256     Clinical Impression Statement Patient's range and strength appear to be improving. Her pain is slightly improved with now able to achieve no pain when not weight bearing, however her pain continues to limit her walking. Patient is responding to E-stim for pain.  PT recommended using TENS while walking to see if that helps her pain which is going to try. Patient would benefit from further skilled PT.    Personal Factors and Comorbidities Comorbidity 2    Comorbidities History of skin cancer, Hyperthyroidism    Examination-Activity Limitations Sit;Sleep;Bed Mobility;Squat;Bend;Stairs;Stand;Transfers;Locomotion Level    Examination-Participation Restrictions Community Activity;Driving;Cleaning;Laundry;Meal Prep;Other   walking, golf   Stability/Clinical Decision Making Stable/Uncomplicated    Rehab Potential Good    PT Frequency 2x / week    PT Duration Other (comment)   10 weeks   PT Treatment/Interventions ADLs/Self Care Home Management;Cryotherapy;Electrical Stimulation;Iontophoresis 4mg /ml Dexamethasone;Moist Heat;Balance training;Therapeutic exercise;Therapeutic activities;Functional mobility training;Stair training;Ultrasound;Neuromuscular re-education;Patient/family education;Passive range of motion;Spinal Manipulations;Joint Manipulations;Dry needling;Taping;Vasopneumatic Device;Manual techniques    PT Next Visit Plan calm down pain  and inflammation then gentle exercise progression as tolerated. check if TENS with walking helped her pain    PT Home Exercise Plan [id number]    Consulted and Agree with Plan of Care Patient             Patient will benefit from skilled therapeutic intervention in order to improve the following deficits and impairments:  Abnormal gait, Decreased endurance, Hypomobility, Increased edema, Decreased activity tolerance, Decreased strength, Pain, Decreased balance, Decreased mobility, Difficulty walking, Impaired perceived functional ability, Improper body mechanics, Impaired flexibility, Decreased coordination, Decreased range of motion  Visit Diagnosis: Chronic pain of right knee  Muscle weakness (generalized)  Difficulty in walking, not elsewhere classified  Localized edema  Referring diagnosis? B14.782 (ICD-10-CM) - Status post total right knee replacement Treatment diagnosis? (if different than referring diagnosis)   Chronic pain of right knee M25.561 ...  Change Dx        2.   Muscle weakness (generalized) M62.81  Change Dx     3.   Difficulty in walking, not elsewhere classified R26.2  Change Dx     4.   Localized edema R60.0  Change Dx              What was this (referring dx) caused by? [x]  Surgery []  Fall []  Ongoing issue []  Arthritis []  Other: ____________  Laterality: [x]  Rt []  Lt []   Both  Check all possible CPT codes:      [x]  97110 (Therapeutic Exercise)  []  92507 (SLP Treatment)  [x]  97112 (Neuro Re-ed)   []  92526 (Swallowing Treatment)   [x]  97116 (Gait Training)   []  [phone] (Cognitive Training, 1st 15 minutes) [x]  97140 (Manual Therapy)   []  97130 (Cognitive Training, each add'l 15 minutes)  [x]  97530 (Therapeutic Activities)  []  Other, List CPT Code ____________    [x]  97535 (Self Care)       []  All codes above (97110 - 97535)  []  97012 (Mechanical Traction)  [x]  97014 (E-stim Unattended)  [x]  97032 (E-stim manual)  []  97033 (Ionto)  []  97035  (Ultrasound)  []  97760 (Orthotic Fit) []  97750 (Physical Performance Training) []  43329 (Aquatic Therapy) []  97034 (Contrast Bath) []  [id number] (Paraffin) []  97597 (Wound Care 1st 20 sq cm) []  97598 (Wound Care each add'l 20 sq cm) [x]  97016 (Vasopneumatic Device) []  [id number] (Orthotic Training) []  [id number] (Prosthetic Training)    Problem List Patient Active Problem List   Diagnosis Date Noted   Status post total right knee replacement 05/30/2021   Unilateral primary osteoarthritis, right knee 05/29/2021   Unilateral primary osteoarthritis, left knee 04/01/2021   Acute appendicitis 01/28/2021   Hyperthyroidism 04/14/2020   Heterozygous factor V Leiden mutation ([place]) 09/23/2015   Lupus anticoagulant positive 09/23/2015   Varicose veins of both lower extremities 09/23/2015   Thrombophlebitis of superficial veins of right lower extremity 04/24/2014    [name], PT, DPT 07/30/2021, 2:27 PM  [facility] Physical Therapy [address] [place], [place], [address] Phone: [phone]   Fax:  [phone]  Name: Kathleen Peters MRN: [id number] Date of Birth: [birth year]

## 2021-07-31 ENCOUNTER — Telehealth: Payer: Self-pay | Admitting: *Deleted

## 2021-07-31 ENCOUNTER — Other Ambulatory Visit: Payer: Self-pay | Admitting: Orthopaedic Surgery

## 2021-07-31 MED ORDER — HYDROCODONE-ACETAMINOPHEN 5-325 MG PO TABS: 1.0000 | ORAL_TABLET | Freq: Four times a day (QID) | ORAL | 0 refills | Status: DC | PRN

## 2021-07-31 NOTE — Telephone Encounter (Signed)
Patient requesting refill of pain medication. Thanks.

## 2021-08-01 ENCOUNTER — Encounter: Payer: Self-pay | Admitting: Rehabilitative and Restorative Service Providers"

## 2021-08-01 ENCOUNTER — Other Ambulatory Visit: Payer: Self-pay

## 2021-08-01 ENCOUNTER — Ambulatory Visit: Payer: Medicare HMO | Admitting: Rehabilitative and Restorative Service Providers"

## 2021-08-01 DIAGNOSIS — R600 Localized edema: Secondary | ICD-10-CM

## 2021-08-01 DIAGNOSIS — R262 Difficulty in walking, not elsewhere classified: Secondary | ICD-10-CM | POA: Diagnosis not present

## 2021-08-01 DIAGNOSIS — G8929 Other chronic pain: Secondary | ICD-10-CM | POA: Diagnosis not present

## 2021-08-01 DIAGNOSIS — M6281 Muscle weakness (generalized): Secondary | ICD-10-CM

## 2021-08-01 DIAGNOSIS — M25561 Pain in right knee: Secondary | ICD-10-CM | POA: Diagnosis not present

## 2021-08-01 NOTE — Therapy (Signed)
[facility] [address] [place], [place], [address] Phone: [phone]   Fax:  [phone]  Physical Therapy Treatment  Patient Details  Name: Kathleen Peters MRN: [id number] Date of Birth: 12-02-1950 Referring Provider (PT): Mcarthur Rossetti, MD   Encounter Date: 08/01/2021   PT End of Session - 08/01/21 0930     Visit Number 13    Number of Visits 23   changed to reflected approved humana visits   Date for PT Re-Evaluation 08/21/21    Authorization Type [employer] $10 copay    Authorization Time Period through 08/21/2021    Authorization - Visit Number 2    Authorization - Number of Visits 12    Progress Note Due on Visit 23    PT Start Time 0930    PT Stop Time 1022    PT Time Calculation (min) 52 min    Activity Tolerance Patient limited by pain   similar overall pain reporting   Behavior During Therapy WFL for tasks assessed/performed             Past Medical History:  Diagnosis Date   Arthritis    Back pain    Cancer ([place])    hx of skin cancer   Graves disease    Hyperthyroidism    Insomnia    Migraines     Past Surgical History:  Procedure Laterality Date   ABDOMINAL HYSTERECTOMY     ELBOW FRACTURE SURGERY     LAPAROSCOPIC APPENDECTOMY N/A 01/29/2021   Procedure: APPENDECTOMY LAPAROSCOPIC;  Surgeon: Clovis Riley, MD;  Location: [facility];  Service: General;  Laterality: N/A;   TONSILLECTOMY     TOTAL KNEE ARTHROPLASTY Right 05/30/2021   Procedure: RIGHT TOTAL KNEE ARTHROPLASTY;  Surgeon: Mcarthur Rossetti, MD;  Location: [facility];  Service: Orthopedics;  Laterality: Right;   TYMPANOSTOMY TUBE PLACEMENT      There were no vitals filed for this visit.   Subjective Assessment - 08/01/21 0936     Subjective Pt. indicated feeling similar overall complaints.    Limitations Sitting;Lifting;Standing;Walking;House hold activities    Patient Stated Goals Reduce pain, walk normally    Currently in Pain? Yes     Pain Score 4     Pain Location Knee    Pain Orientation Right    Pain Descriptors / Indicators Aching;Constant;Sore;Tightness    Pain Type Surgical pain    Pain Onset More than a month ago   surgery   Pain Frequency Intermittent    Aggravating Factors  walking, standing    Pain Relieving Factors rest                               [facility] Adult PT Treatment/Exercise - 08/01/21 0001       Neuro Re-ed    Neuro Re-ed Details  tandem stance on foam 1 min x 1 bilateral      Knee/Hip Exercises: Stretches   Knee: Self-Stretch Limitations seated tailgate stretch 15 sec X5    Gastroc Stretch 30 seconds;3 reps;Both      Knee/Hip Exercises: Aerobic   Nustep Lvl 6 10 mins UE/LE      Knee/Hip Exercises: Standing   Heel Raises Both;2 sets;10 reps    Heel Raises Limitations tennis ball squeeze to maintain LE alignment.    Lateral Step Up Step Height: 6";Right;Hand Hold: 2   x 10, x 5   Forward Step Up Step Height: 6";Hand  Hold: 2;Right   2 x10     Knee/Hip Exercises: Seated   Sit to Sand 2 sets;10 reps;without UE support   18 inch chair c airex pad to make 20 inch height, slower lowering focus     Electrical Stimulation   Electrical Stimulation Location Rt knee    Electrical Stimulation Action IFC in combination c vaso use    Electrical Stimulation Parameters to tolerance    Electrical Stimulation Goals Pain      Vasopneumatic   Number Minutes Vasopneumatic  10 minutes    Vasopnuematic Location  Knee    Vasopneumatic Pressure Medium    Vasopneumatic Temperature  34                     PT Education - 08/01/21 1006     Education Details Continued generalized POC education and emphasis on why performing interventions.    Person(s) Educated Patient    Methods Explanation    Comprehension Verbalized understanding              PT Short Term Goals - 07/30/21 1417       PT SHORT TERM GOAL #1   Title Patient will demonstrate independent use of home  exercise program to maintain progress from in clinic treatments.    Time 3    Period Weeks    Status Achieved    Target Date 07/03/21               PT Long Term Goals - 07/16/21 1443       PT LONG TERM GOAL #1   Title Patient will demonstrate/report pain at worst less than or equal to 2/10 to facilitate minimal limitation in daily activity secondary to pain symptoms.    Time 10    Period Weeks    Status On-going    Target Date 08/21/21      PT LONG TERM GOAL #2   Title Patient will demonstrate independent use of home exercise program to facilitate ability to maintain/progress functional gains from skilled physical therapy services.    Time 10    Period Weeks    Status On-going    Target Date 08/21/21      PT LONG TERM GOAL #3   Title Pt. will demonstrate FOTO outcome > or = 72% to indicated reduced disability due to condition.    Time 10    Period Weeks    Status On-going    Target Date 08/21/21      PT LONG TERM GOAL #4   Title Patient will demonstrate Rt knee AROM 0-110 degrees to facilitate ability to perform transfers, sitting, ambulation, stair navigation s restriction due to mobility.    Time 10    Period Weeks    Status On-going    Target Date 08/21/21      PT LONG TERM GOAL #5   Title Patient will demonstrate Rt LE MMT 5/5 throughout to facilitate ability to perform usual standing, walking, stairs at PLOF s limitation due to symptoms.    Time 10    Period Weeks    Status On-going    Target Date 08/21/21      PT LONG TERM GOAL #6   Title Pt. will demonstrate bilateral SLS > 15 seconds to facilitate stability in ambulation on even and uneven surfaces.    Time 10    Period Weeks    Status On-going    Target Date 08/21/21      PT  LONG TERM GOAL #7   Title Pt. will demonstrate reciprocal gait pattern up/down 3 stairs s handrail for household entry.    Time 10    Period Weeks    Status On-going    Target Date 08/21/21                    Plan - 08/01/21 1012     Clinical Impression Statement Plan today was to continue recent performance techniques and ther ex interventions with post activity modalities in effort to help continue progression in strength and performance and manage symptom complaints.  Similar overall reportings noted today.  Continued skilled PT services indicated at this time.    Personal Factors and Comorbidities Comorbidity 2    Comorbidities History of skin cancer, Hyperthyroidism    Examination-Activity Limitations Sit;Sleep;Bed Mobility;Squat;Bend;Stairs;Stand;Transfers;Locomotion Level    Examination-Participation Restrictions Community Activity;Driving;Cleaning;Laundry;Meal Prep;Other   walking, golf   Stability/Clinical Decision Making Stable/Uncomplicated    Rehab Potential Good    PT Frequency 2x / week    PT Duration Other (comment)   10 weeks   PT Treatment/Interventions ADLs/Self Care Home Management;Cryotherapy;Electrical Stimulation;Iontophoresis 4mg /ml Dexamethasone;Moist Heat;Balance training;Therapeutic exercise;Therapeutic activities;Functional mobility training;Stair training;Ultrasound;Neuromuscular re-education;Patient/family education;Passive range of motion;Spinal Manipulations;Joint Manipulations;Dry needling;Taping;Vasopneumatic Device;Manual techniques    PT Next Visit Plan Use of modalities as tolerated, easy steady progression as able.    PT Home Exercise Plan [id number]    Consulted and Agree with Plan of Care Patient             Patient will benefit from skilled therapeutic intervention in order to improve the following deficits and impairments:  Abnormal gait, Decreased endurance, Hypomobility, Increased edema, Decreased activity tolerance, Decreased strength, Pain, Decreased balance, Decreased mobility, Difficulty walking, Impaired perceived functional ability, Improper body mechanics, Impaired flexibility, Decreased coordination, Decreased range of motion  Visit  Diagnosis: Chronic pain of right knee  Muscle weakness (generalized)  Difficulty in walking, not elsewhere classified  Localized edema     Problem List Patient Active Problem List   Diagnosis Date Noted   Status post total right knee replacement 05/30/2021   Unilateral primary osteoarthritis, right knee 05/29/2021   Unilateral primary osteoarthritis, left knee 04/01/2021   Acute appendicitis 01/28/2021   Hyperthyroidism 04/14/2020   Heterozygous factor V Leiden mutation ([place]) 09/23/2015   Lupus anticoagulant positive 09/23/2015   Varicose veins of both lower extremities 09/23/2015   Thrombophlebitis of superficial veins of right lower extremity 04/24/2014   Scot Jun, PT, DPT, OCS, ATC 08/01/21  10:14 AM    [facility] [address] [place], [place], [address] Phone: [phone]   Fax:  [phone]  Name: DANYEAL AKENS MRN: [id number] Date of Birth: 13-Jul-1951

## 2021-08-05 ENCOUNTER — Ambulatory Visit: Payer: Medicare HMO | Admitting: Physical Therapy

## 2021-08-05 ENCOUNTER — Other Ambulatory Visit: Payer: Self-pay

## 2021-08-05 ENCOUNTER — Encounter: Payer: Self-pay | Admitting: Physical Therapy

## 2021-08-05 DIAGNOSIS — R262 Difficulty in walking, not elsewhere classified: Secondary | ICD-10-CM

## 2021-08-05 DIAGNOSIS — R600 Localized edema: Secondary | ICD-10-CM | POA: Diagnosis not present

## 2021-08-05 DIAGNOSIS — M25561 Pain in right knee: Secondary | ICD-10-CM

## 2021-08-05 DIAGNOSIS — M6281 Muscle weakness (generalized): Secondary | ICD-10-CM

## 2021-08-05 DIAGNOSIS — G8929 Other chronic pain: Secondary | ICD-10-CM | POA: Diagnosis not present

## 2021-08-05 NOTE — Therapy (Addendum)
[facility] [address] [place], [place], [address] Phone: [phone]   Fax:  [phone]  PHYSICAL THERAPY DISCHARGE SUMMARY (written on 09/11/2021)  Visits from Start of Care: 14  Current functional level related to goals / functional outcomes: See below.    Remaining deficits: Patient saw Dr. Ninfa Linden on 08/06/2021 who recommended stopping PT.    Education / Equipment: HEP   Patient agrees to discharge. Patient goals were not met. Patient is being discharged due to the physician's request.  Kathleen Peters, PT, DPT Physical Therapist Specializing in Prosthetic Rehab [facility] [place], [place] [address] Phone [phone] FAX [phone]      Physical Therapy Treatment  Patient Details  Name: Kathleen Peters MRN: [id number] Date of Birth: 09/05/1950 Referring Provider (PT): Mcarthur Rossetti, MD   Encounter Date: 08/05/2021   PT End of Session - 08/05/21 1305     Visit Number 14    Number of Visits 23   changed to reflected approved humana visits   Date for PT Re-Evaluation 08/21/21    Authorization Type [employer] $10 copay    Authorization Time Period through 08/21/2021    Authorization - Visit Number 3    Authorization - Number of Visits 12    Progress Note Due on Visit 23    PT Start Time 1300    PT Stop Time 8088    PT Time Calculation (min) 55 min    Activity Tolerance Patient limited by pain   similar overall pain reporting   Behavior During Therapy WFL for tasks assessed/performed             Past Medical History:  Diagnosis Date   Arthritis    Back pain    Cancer ([place])    hx of skin cancer   Graves disease    Hyperthyroidism    Insomnia    Migraines     Past Surgical History:  Procedure Laterality Date   ABDOMINAL HYSTERECTOMY     ELBOW FRACTURE SURGERY     LAPAROSCOPIC APPENDECTOMY N/A 01/29/2021   Procedure: APPENDECTOMY LAPAROSCOPIC;  Surgeon:  Kathleen Riley, MD;  Location: [facility];  Service: General;  Laterality: N/A;   TONSILLECTOMY     TOTAL KNEE ARTHROPLASTY Right 05/30/2021   Procedure: RIGHT TOTAL KNEE ARTHROPLASTY;  Surgeon: Mcarthur Rossetti, MD;  Location: [facility];  Service: Orthopedics;  Laterality: Right;   TYMPANOSTOMY TUBE PLACEMENT      There were no vitals filed for this visit.   Subjective Assessment - 08/05/21 1259     Subjective She was sick with congestion & cough over weekend.  She did her exercises.  She is no longer having constant pain.    Limitations Sitting;Lifting;Standing;Walking;House hold activities    Patient Stated Goals Reduce pain, walk normally    Currently in Pain? Yes    Pain Score 1    in last week, lowest 0/10 - highest 6/10   Pain Location Knee    Pain Orientation Right    Pain Descriptors / Indicators Aching;Sore    Pain Type Surgical pain    Pain Onset More than a month ago   surgery   Pain Frequency Intermittent    Aggravating Factors  after standing or walking more ~15 minutes    Pain Relieving Factors rest    Effect of Pain on Daily Activities walking  [facility] PT Assessment - 08/05/21 0001       Assessment   Medical Diagnosis M17.11 (ICD-10-CM) - Unilateral primary osteoarthritis, right knee  Z96.651 (ICD-10-CM) - Status post total right knee replacement    Referring Provider (PT) Mcarthur Rossetti, MD    Onset Date/Surgical Date 05/30/21      AROM   Right Knee Extension -2   seated LAQ   Right Knee Flexion 99   88* standing antigravity, 99* heel slide supine     PROM   Right Knee Extension -1   supine   Right Knee Flexion 111   seated                          [facility] Adult PT Treatment/Exercise - 08/05/21 1300       Ambulation/Gait   Ambulation/Gait Yes    Ambulation/Gait Assistance 7: Independent    Ambulation/Gait Assistance Details pt able to pick up pace without knee pain      Neuro Re-ed    Neuro Re-ed Details   tandem stance on foam 1 min x 1 bilateral,  stepping to cones - ant-lat, lat, post-lat 10 reps ea. LE.      Knee/Hip Exercises: Stretches   Knee: Self-Stretch Limitations --    Gastroc Stretch 30 seconds;3 reps;Both      Knee/Hip Exercises: Aerobic   Recumbent Bike SciFit seat 9 level 1 with LEs only for 8 min      Knee/Hip Exercises: Machines for Strengthening   Cybex Knee Extension 5# concentric BLEs eccentric RLE 10 reps 2 sets    Cybex Knee Flexion LLE 20# 10 reps 1 set RLE 10# 10reps      Knee/Hip Exercises: Standing   Heel Raises Both;2 sets;10 reps    Heel Raises Limitations --    Lateral Step Up --    Forward Step Up --      Knee/Hip Exercises: Seated   Sit to Sand 10 reps;without UE support;1 set   18 inch chair, slower lowering focus     Knee/Hip Exercises: Supine   Quad Sets Strengthening;Left;1 set;10 reps    Quad Sets Limitations leg press elongating LE    Straight Leg Raises Strengthening;Left;1 set;10 reps      Acupuncturist Location --    Printmaker Goals --      Vasopneumatic   Number Minutes Vasopneumatic  10 minutes    Vasopnuematic Location  Knee    Vasopneumatic Pressure Medium    Vasopneumatic Temperature  34      Manual Therapy   Manual therapy comments Rt knee PROM with overpressure to tolerance    Muscle Energy Technique For knee flexion gentle manual traction with mild tibial internal rotation and contralateral LAQ.  gentle overpressure with slight femur int rotation for knee ext.                       PT Short Term Goals - 07/30/21 1417       PT SHORT TERM GOAL #1   Title Patient will demonstrate independent use of home exercise program to maintain progress from in clinic treatments.    Time 3    Period Weeks    Status Achieved    Target Date 07/03/21               PT Long Term Goals - 07/16/21 1443       PT LONG TERM  GOAL #1   Title Patient will demonstrate/report  pain at worst less than or equal to 2/10 to facilitate minimal limitation in daily activity secondary to pain symptoms.    Time 10    Period Weeks    Status On-going    Target Date 08/21/21      PT LONG TERM GOAL #2   Title Patient will demonstrate independent use of home exercise program to facilitate ability to maintain/progress functional gains from skilled physical therapy services.    Time 10    Period Weeks    Status On-going    Target Date 08/21/21      PT LONG TERM GOAL #3   Title Pt. will demonstrate FOTO outcome > or = 72% to indicated reduced disability due to condition.    Time 10    Period Weeks    Status On-going    Target Date 08/21/21      PT LONG TERM GOAL #4   Title Patient will demonstrate Rt knee AROM 0-110 degrees to facilitate ability to perform transfers, sitting, ambulation, stair navigation s restriction due to mobility.    Time 10    Period Weeks    Status On-going    Target Date 08/21/21      PT LONG TERM GOAL #5   Title Patient will demonstrate Rt LE MMT 5/5 throughout to facilitate ability to perform usual standing, walking, stairs at PLOF s limitation due to symptoms.    Time 10    Period Weeks    Status On-going    Target Date 08/21/21      PT LONG TERM GOAL #6   Title Pt. will demonstrate bilateral SLS > 15 seconds to facilitate stability in ambulation on even and uneven surfaces.    Time 10    Period Weeks    Status On-going    Target Date 08/21/21      PT LONG TERM GOAL #7   Title Pt. will demonstrate reciprocal gait pattern up/down 3 stairs s handrail for household entry.    Time 10    Period Weeks    Status On-going    Target Date 08/21/21                   Plan - 08/05/21 1300     Clinical Impression Statement Patient had improved PROM & AROM including anti-gravity.  She ambulates and moves with less pain today. She is also reporting lower pain including lowest at 0/10 & highest 6/10.  Patient continues to benefit from  skilled PT to improve function with less pain.    Personal Factors and Comorbidities Comorbidity 2    Comorbidities History of skin cancer, Hyperthyroidism    Examination-Activity Limitations Sit;Sleep;Bed Mobility;Squat;Bend;Stairs;Stand;Transfers;Locomotion Level    Examination-Participation Restrictions Community Activity;Driving;Cleaning;Laundry;Meal Prep;Other   walking, golf   Stability/Clinical Decision Making Stable/Uncomplicated    Rehab Potential Good    PT Frequency 2x / week    PT Duration Other (comment)   10 weeks   PT Treatment/Interventions ADLs/Self Care Home Management;Cryotherapy;Electrical Stimulation;Iontophoresis 63m/ml Dexamethasone;Moist Heat;Balance training;Therapeutic exercise;Therapeutic activities;Functional mobility training;Stair training;Ultrasound;Neuromuscular re-education;Patient/family education;Passive range of motion;Spinal Manipulations;Joint Manipulations;Dry needling;Taping;Vasopneumatic Device;Manual techniques    PT Next Visit Plan progress functional strengthening exercises through full range, balance exercises,  end with vaso    PT Home Exercise Plan [id number]   Consulted and Agree with Plan of Care Patient             Patient will benefit from skilled therapeutic intervention in  order to improve the following deficits and impairments:  Abnormal gait, Decreased endurance, Hypomobility, Increased edema, Decreased activity tolerance, Decreased strength, Pain, Decreased balance, Decreased mobility, Difficulty walking, Impaired perceived functional ability, Improper body mechanics, Impaired flexibility, Decreased coordination, Decreased range of motion  Visit Diagnosis: Chronic pain of right knee  Muscle weakness (generalized)  Difficulty in walking, not elsewhere classified  Localized edema     Problem List Patient Active Problem List   Diagnosis Date Noted   Status post total right knee replacement 05/30/2021   Unilateral primary  osteoarthritis, right knee 05/29/2021   Unilateral primary osteoarthritis, left knee 04/01/2021   Acute appendicitis 01/28/2021   Hyperthyroidism 04/14/2020   Heterozygous factor V Leiden mutation ([place]) 09/23/2015   Lupus anticoagulant positive 09/23/2015   Varicose veins of both lower extremities 09/23/2015   Thrombophlebitis of superficial veins of right lower extremity 04/24/2014    Kathleen Peters, PT, DPT 08/05/2021, 1:58 PM  [facility] [address] [place], [place], [address] Phone: [phone]   Fax:  [phone]  Name: MALKA BOCEK MRN: [id number] Date of Birth: 1951/07/09

## 2021-08-06 ENCOUNTER — Ambulatory Visit (INDEPENDENT_AMBULATORY_CARE_PROVIDER_SITE_OTHER): Payer: Medicare HMO | Admitting: Orthopaedic Surgery

## 2021-08-06 ENCOUNTER — Encounter: Payer: Self-pay | Admitting: Orthopaedic Surgery

## 2021-08-06 DIAGNOSIS — Z96651 Presence of right artificial knee joint: Secondary | ICD-10-CM

## 2021-08-06 MED ORDER — PREDNISONE 50 MG PO TABS: ORAL_TABLET | ORAL | 0 refills | Status: DC

## 2021-08-06 NOTE — Progress Notes (Signed)
The patient is now between 9 and 10 weeks status post a right total knee arthroplasty.  She is currently doing little bit better in terms of pain control.  She has been going to physical therapy.  She had really good 3 days of no pain and then started having pain again yesterday after therapy.  On exam her range of motion is really good and I am proud of how she is push herself to range of motion.  She is walking without assistive device and not really a significant limp.  Her incisions healed nicely.  She does have moderate swelling to be expected.  I would like her to stop physical therapy at this standpoint since she is doing better on her own in terms of the pain control aspect of things.  She will continue her hydrocodone as needed and occasional Neurontin.  I am going to send in 5 days of prednisone which I think will help calm down the inflammation in the since she is far enough out from her surgery.  I would like to see her back in 4 weeks to see how she is doing overall but no x-rays are needed.

## 2021-08-07 ENCOUNTER — Encounter: Payer: Medicare HMO | Admitting: Physical Therapy

## 2021-08-12 ENCOUNTER — Encounter: Payer: Medicare HMO | Admitting: Physical Therapy

## 2021-08-14 ENCOUNTER — Encounter: Payer: Medicare HMO | Admitting: Physical Therapy

## 2021-08-19 ENCOUNTER — Encounter: Payer: Medicare HMO | Admitting: Physical Therapy

## 2021-08-21 ENCOUNTER — Encounter: Payer: Medicare HMO | Admitting: Physical Therapy

## 2021-08-27 ENCOUNTER — Telehealth: Payer: Self-pay | Admitting: *Deleted

## 2021-08-27 ENCOUNTER — Other Ambulatory Visit: Payer: Self-pay | Admitting: Orthopaedic Surgery

## 2021-08-27 MED ORDER — HYDROCODONE-ACETAMINOPHEN 5-325 MG PO TABS: 1.0000 | ORAL_TABLET | Freq: Four times a day (QID) | ORAL | 0 refills | Status: DC | PRN

## 2021-08-27 MED ORDER — GABAPENTIN 300 MG PO CAPS: 300.0000 mg | ORAL_CAPSULE | Freq: Three times a day (TID) | ORAL | 0 refills | Status: DC

## 2021-08-27 NOTE — Telephone Encounter (Signed)
Patient called requesting refills of pain medication and Gabapentin. She also states she was doing well for a while, now pain has returned along with stiffness. Appt next week, but asked if she should return to therapy at this point to help with stiffness. Thanks.

## 2021-09-03 ENCOUNTER — Encounter: Payer: Self-pay | Admitting: Orthopaedic Surgery

## 2021-09-03 ENCOUNTER — Ambulatory Visit (INDEPENDENT_AMBULATORY_CARE_PROVIDER_SITE_OTHER): Payer: Medicare HMO | Admitting: Orthopaedic Surgery

## 2021-09-03 DIAGNOSIS — Z96651 Presence of right artificial knee joint: Secondary | ICD-10-CM

## 2021-09-03 MED ORDER — TRAMADOL HCL 50 MG PO TABS: 50.0000 mg | ORAL_TABLET | Freq: Four times a day (QID) | ORAL | 0 refills | Status: DC | PRN

## 2021-09-03 MED ORDER — LIDOCAINE 5 % EX PTCH: 1.0000 | MEDICATED_PATCH | CUTANEOUS | 0 refills | Status: DC

## 2021-09-03 NOTE — Progress Notes (Signed)
The patient is now just past 3 months status post a right total knee arthroplasty.  She is very frustrated about the lateral pain that she is having in her knee.  Fortunately she has pushed through this in terms of her determination and getting her knee bending and moving.  She has done incredibly well in terms of the range of motion of her knee.  She just unfortunately is still dealing with persistent pain the lateral aspect of her knee which can be quite common status post knee replacement surgery and I told her that majority of patients that are young and have vibrant nerves and muscles can take 6 months to a year to recover.  On my exam today her incisions healed nicely.  She has mild swelling of the knee to be expected but there is no redness.  Her range of motion is excellent and full and the knee feels ligamentously stable.  She does have some lateral knee pain and some of this to me is at the IT band itself.  I did review the x-rays from the last visit and her implant appears to be well-seated with no oversizing or complicating features.  Will try Lidoderm patch for the lateral side of her knee and will send in some tramadol for pain because she has tolerated this well in the past.  I would like to see her back in 4 weeks to see how he she is doing overall.  I would like an AP and lateral standing of her right knee at that visit.

## 2021-09-04 ENCOUNTER — Other Ambulatory Visit: Payer: Self-pay | Admitting: Orthopaedic Surgery

## 2021-09-04 ENCOUNTER — Telehealth: Payer: Self-pay | Admitting: *Deleted

## 2021-09-04 DIAGNOSIS — L821 Other seborrheic keratosis: Secondary | ICD-10-CM | POA: Diagnosis not present

## 2021-09-04 DIAGNOSIS — D225 Melanocytic nevi of trunk: Secondary | ICD-10-CM | POA: Diagnosis not present

## 2021-09-04 DIAGNOSIS — L400 Psoriasis vulgaris: Secondary | ICD-10-CM | POA: Diagnosis not present

## 2021-09-04 DIAGNOSIS — D2272 Melanocytic nevi of left lower limb, including hip: Secondary | ICD-10-CM | POA: Diagnosis not present

## 2021-09-04 DIAGNOSIS — L438 Other lichen planus: Secondary | ICD-10-CM | POA: Diagnosis not present

## 2021-09-04 DIAGNOSIS — Z85828 Personal history of other malignant neoplasm of skin: Secondary | ICD-10-CM | POA: Diagnosis not present

## 2021-09-04 DIAGNOSIS — D2271 Melanocytic nevi of right lower limb, including hip: Secondary | ICD-10-CM | POA: Diagnosis not present

## 2021-09-04 MED ORDER — LIDOCAINE 5 % EX PTCH: 1.0000 | MEDICATED_PATCH | CUTANEOUS | 0 refills | Status: DC

## 2021-09-04 NOTE — Telephone Encounter (Signed)
Ortho bundle 90 day call completed. 

## 2021-09-04 NOTE — Telephone Encounter (Signed)
Patient called saying her pharmacy did not get Rx for the lidocaine patch. When I look at it, it looks like it printed instead of going to the pharmacy. Could this be resent? Thanks.

## 2021-09-04 NOTE — Telephone Encounter (Signed)
CM called patient and updated that medication was re-sent.

## 2021-09-11 ENCOUNTER — Encounter: Payer: Self-pay | Admitting: Physical Therapy

## 2021-09-22 ENCOUNTER — Other Ambulatory Visit: Payer: Self-pay

## 2021-09-22 MED ORDER — IBUPROFEN 800 MG PO TABS: 800.0000 mg | ORAL_TABLET | Freq: Three times a day (TID) | ORAL | 1 refills | Status: DC | PRN

## 2021-09-23 ENCOUNTER — Other Ambulatory Visit: Payer: Self-pay | Admitting: Orthopaedic Surgery

## 2021-09-23 ENCOUNTER — Telehealth: Payer: Self-pay | Admitting: *Deleted

## 2021-09-23 MED ORDER — HYDROCODONE-ACETAMINOPHEN 5-325 MG PO TABS: 1.0000 | ORAL_TABLET | Freq: Four times a day (QID) | ORAL | 0 refills | Status: DC | PRN

## 2021-09-23 NOTE — Telephone Encounter (Signed)
Patient called today requesting refill of Hydrocodone. She also verbalized she sees you next week. She is still frustrated over the pain in the outer part of the knee. Lidocaine and all the interventions she has tried haven't helped. I explained this may continue to resolve with time, but you guys could discuss next week. Thanks.

## 2021-09-25 ENCOUNTER — Other Ambulatory Visit: Payer: Self-pay

## 2021-09-25 MED ORDER — IBUPROFEN 800 MG PO TABS: 800.0000 mg | ORAL_TABLET | Freq: Three times a day (TID) | ORAL | 1 refills | Status: DC | PRN

## 2021-10-01 DIAGNOSIS — E0500 Thyrotoxicosis with diffuse goiter without thyrotoxic crisis or storm: Secondary | ICD-10-CM | POA: Diagnosis not present

## 2021-10-01 DIAGNOSIS — R768 Other specified abnormal immunological findings in serum: Secondary | ICD-10-CM | POA: Diagnosis not present

## 2021-10-01 DIAGNOSIS — M25562 Pain in left knee: Secondary | ICD-10-CM | POA: Diagnosis not present

## 2021-10-01 DIAGNOSIS — I7300 Raynaud's syndrome without gangrene: Secondary | ICD-10-CM | POA: Diagnosis not present

## 2021-10-01 DIAGNOSIS — M2550 Pain in unspecified joint: Secondary | ICD-10-CM | POA: Diagnosis not present

## 2021-10-01 DIAGNOSIS — M25561 Pain in right knee: Secondary | ICD-10-CM | POA: Diagnosis not present

## 2021-10-01 DIAGNOSIS — M25462 Effusion, left knee: Secondary | ICD-10-CM | POA: Diagnosis not present

## 2021-10-01 DIAGNOSIS — M549 Dorsalgia, unspecified: Secondary | ICD-10-CM | POA: Diagnosis not present

## 2021-10-01 DIAGNOSIS — M1990 Unspecified osteoarthritis, unspecified site: Secondary | ICD-10-CM | POA: Diagnosis not present

## 2021-10-01 DIAGNOSIS — E1122 Type 2 diabetes mellitus with diabetic chronic kidney disease: Secondary | ICD-10-CM | POA: Diagnosis not present

## 2021-10-01 DIAGNOSIS — Z79899 Other long term (current) drug therapy: Secondary | ICD-10-CM | POA: Diagnosis not present

## 2021-10-02 ENCOUNTER — Other Ambulatory Visit: Payer: Self-pay

## 2021-10-02 ENCOUNTER — Encounter: Payer: Self-pay | Admitting: Orthopaedic Surgery

## 2021-10-02 ENCOUNTER — Ambulatory Visit (INDEPENDENT_AMBULATORY_CARE_PROVIDER_SITE_OTHER): Payer: Medicare HMO

## 2021-10-02 ENCOUNTER — Ambulatory Visit: Payer: Medicare HMO | Admitting: Orthopaedic Surgery

## 2021-10-02 DIAGNOSIS — Z96651 Presence of right artificial knee joint: Secondary | ICD-10-CM

## 2021-10-02 NOTE — Progress Notes (Signed)
The patient is now just past 4 months status post a right total knee arthroplasty.  She is still been quite frustrated with the lateral knee pain she is having.  On exam her knee moves smoothly and fluidly.  She has full range of motion of the knee with minimal swelling.  Her extensor mechanism is intact.  She is tender over the IT band at the lateral femoral condyle and I do feel some grinding there and that bothers her the most.  2 views of the right knee show well-seated total knee arthroplasty and I see no complicating features of this.  It does not appear to be oversized.  I did provide hopefully a therapeutic and diagnostic injection with 1 cc of lidocaine and 1 cc of steroid all around that IT band area where she was tender.  She did feel better with the lidocaine injection.  At this point I will send her to [facility] physical therapy for any modalities to treat severe IT band pain and hopefully this can get this to calm down.  I will see her back in 4 weeks to see how she is doing overall.

## 2021-10-09 DIAGNOSIS — M25561 Pain in right knee: Secondary | ICD-10-CM | POA: Diagnosis not present

## 2021-10-13 DIAGNOSIS — M25561 Pain in right knee: Secondary | ICD-10-CM | POA: Diagnosis not present

## 2021-10-16 DIAGNOSIS — M25561 Pain in right knee: Secondary | ICD-10-CM | POA: Diagnosis not present

## 2021-10-21 DIAGNOSIS — M25561 Pain in right knee: Secondary | ICD-10-CM | POA: Diagnosis not present

## 2021-10-22 DIAGNOSIS — E0500 Thyrotoxicosis with diffuse goiter without thyrotoxic crisis or storm: Secondary | ICD-10-CM | POA: Diagnosis not present

## 2021-10-22 DIAGNOSIS — E0590 Thyrotoxicosis, unspecified without thyrotoxic crisis or storm: Secondary | ICD-10-CM | POA: Diagnosis not present

## 2021-10-28 ENCOUNTER — Other Ambulatory Visit: Payer: Self-pay | Admitting: Orthopaedic Surgery

## 2021-10-28 ENCOUNTER — Telehealth: Payer: Self-pay | Admitting: *Deleted

## 2021-10-28 MED ORDER — HYDROCODONE-ACETAMINOPHEN 5-325 MG PO TABS: 1.0000 | ORAL_TABLET | Freq: Three times a day (TID) | ORAL | 0 refills | Status: DC | PRN

## 2021-10-28 NOTE — Telephone Encounter (Signed)
Patient called requesting refill of pain medication (Hydrocodone). Thank you. ?

## 2021-10-29 ENCOUNTER — Ambulatory Visit: Payer: Medicare HMO | Admitting: Orthopaedic Surgery

## 2021-10-29 DIAGNOSIS — I7300 Raynaud's syndrome without gangrene: Secondary | ICD-10-CM | POA: Diagnosis not present

## 2021-10-29 DIAGNOSIS — E0590 Thyrotoxicosis, unspecified without thyrotoxic crisis or storm: Secondary | ICD-10-CM | POA: Diagnosis not present

## 2021-10-29 DIAGNOSIS — D72810 Lymphocytopenia: Secondary | ICD-10-CM | POA: Diagnosis not present

## 2021-10-29 DIAGNOSIS — E0500 Thyrotoxicosis with diffuse goiter without thyrotoxic crisis or storm: Secondary | ICD-10-CM | POA: Diagnosis not present

## 2021-10-29 DIAGNOSIS — Z808 Family history of malignant neoplasm of other organs or systems: Secondary | ICD-10-CM | POA: Diagnosis not present

## 2021-11-06 ENCOUNTER — Ambulatory Visit (INDEPENDENT_AMBULATORY_CARE_PROVIDER_SITE_OTHER): Payer: Medicare HMO | Admitting: Orthopedic Surgery

## 2021-11-06 ENCOUNTER — Other Ambulatory Visit: Payer: Self-pay

## 2021-11-06 DIAGNOSIS — Z96651 Presence of right artificial knee joint: Secondary | ICD-10-CM | POA: Diagnosis not present

## 2021-11-07 ENCOUNTER — Encounter: Payer: Self-pay | Admitting: Orthopedic Surgery

## 2021-11-07 NOTE — Progress Notes (Signed)
? ?Office Visit Note ?  ?Patient: Kathleen Peters           ?Date of Birth: May 31, 1951           ?MRN: [id number] ?Visit Date: 11/06/2021 ?Requested by: Kathleen Contras, Kathleen Peters ?[facility] ?[address] ?[place],  [place] [address] ?PCP: Kathleen Contras, Kathleen Peters ? ?Subjective: ?Chief Complaint  ?Patient presents with  ? Right Knee - Pain  ? ? ?HPI: Kathleen Peters is a 71 year old patient here for second opinion regarding lateral sided knee pain.  She underwent uncomplicated right total knee arthroplasty 05/30/2021.  Op note is reviewed.  To knee placed which was cemented.  Slight preop valgus alignment was noted.  No lateral release was required for patella tracking.  She was doing well the first 2 weeks after surgery.  During her early physical therapy appointments some event happened where a twisting force was applied to the knee and she really has reported pain in the right knee since that time.  Describes pain when going from the flexed to the straight position when she is ambulating.  She stopped physical therapy in December.  She has had an injection over the lateral condyle iliotibial band interface which gave her about 30% relief.  She has tried ice heat and topical rubs without relief.  She states this is really a pain that she cannot live with.  Denies any fevers or chills or instability symptoms in the knee. ?             ?ROS: All systems reviewed are negative as they relate to the chief complaint within the history of present illness.  Patient denies  fevers or chills. ? ? ?Assessment & Plan: ?Visit Diagnoses:  ?1. Status post total right knee replacement   ? ? ?Plan: Impression is lateral sided pain after right knee replacement.  Postop radiographs show well aligned prosthesis and it does feel very balanced on exam.  She is having some crepitus over that iliotibial band region at the joint line on the lateral side.  Ultrasound examination today does demonstrate some thickening of the iliotibial band in this region.  Hard to  say exactly what the irritating force is.  I have had this in 1 my patients and it did resolve by 1 year postop.  I did show her how to do iliotibial band stretching which would be a conservative treatment option to start with for this problem.  May or may not help.  Unclear exactly how the inciting event because of this problem.  Surgical options are discussed including arthroscopy with debridement on that lateral side and possible partial release of that iliotibial band either at the attachment of Kathleen Peters or through a lateral release itself.  The risk of that invasive procedure would be introducing infection into the knee.  She has had a family member who had an adverse outcome from that intervention.  All these things are discussed at length with the patient.  She will consider her options and discuss further treatment plans with Kathleen Peters. Ninfa Peters.  Happy to assist in any way if needed.  I do think that operative intervention could be helpful but is somewhat unpredictable in the situation. ? ?Follow-Up Instructions: Return if symptoms worsen or fail to improve.  ? ?Orders:  ?No orders of the defined types were placed in this encounter. ? ?No orders of the defined types were placed in this encounter. ? ? ? ? Procedures: ?No procedures performed ? ? ?Clinical Data: ?No additional  findings. ? ?Objective: ?Vital Signs: There were no vitals taken for this visit. ? ?Physical Exam:  ? ?Constitutional: Patient appears well-developed ?HEENT:  ?Head: Normocephalic ?Eyes:EOM are normal ?Neck: Normal range of motion ?Cardiovascular: Normal rate ?Pulmonary/chest: Effort normal ?Neurologic: Patient is alert ?Skin: Skin is warm ?Psychiatric: Patient has normal mood and affect ? ? ?Ortho Exam: Ortho exam demonstrates antalgic gait to the right.  She has some palpable crepitus and grinding with the knee going from extension to flexion and flexion to extension in the early ranges of motion.  Most palpable around the joint  line.  Tender in this region as well.  Patella has good mobility consistent with good range of motion of her knee.  No excessive unexpected warmth in the knee.  Collaterals are stable to varus valgus stress at 0 30 and 90 degrees. ? ?Specialty Comments:  ?No specialty comments available. ? ?Imaging: ?No results found. ? ? ?PMFS History: ?Patient Active Problem List  ? Diagnosis Date Noted  ? Status post total right knee replacement 05/30/2021  ? Unilateral primary osteoarthritis, right knee 05/29/2021  ? Unilateral primary osteoarthritis, left knee 04/01/2021  ? Acute appendicitis 01/28/2021  ? Hyperthyroidism 04/14/2020  ? Heterozygous factor V Leiden mutation ([place]) 09/23/2015  ? Lupus anticoagulant positive 09/23/2015  ? Varicose veins of both lower extremities 09/23/2015  ? Thrombophlebitis of superficial veins of right lower extremity 04/24/2014  ? ?Past Medical History:  ?Diagnosis Date  ? Arthritis   ? Back pain   ? Cancer [facility])   ? hx of skin cancer  ? Graves disease   ? Hyperthyroidism   ? Insomnia   ? Migraines   ?  ?Family History  ?Problem Relation Age of Onset  ? Thyroid cancer Sister   ?  ?Past Surgical History:  ?Procedure Laterality Date  ? ABDOMINAL HYSTERECTOMY    ? ELBOW FRACTURE SURGERY    ? LAPAROSCOPIC APPENDECTOMY N/A 01/29/2021  ? Procedure: APPENDECTOMY LAPAROSCOPIC;  Surgeon: Clovis Riley, Kathleen Peters;  Location: [facility];  Service: General;  Laterality: N/A;  ? TONSILLECTOMY    ? TOTAL KNEE ARTHROPLASTY Right 05/30/2021  ? Procedure: RIGHT TOTAL KNEE ARTHROPLASTY;  Surgeon: Mcarthur Rossetti, Kathleen Peters;  Location: [facility];  Service: Orthopedics;  Laterality: Right;  ? TYMPANOSTOMY TUBE PLACEMENT    ? ?Social History  ? ?Occupational History  ? Not on file  ?Tobacco Use  ? Smoking status: Never  ? Smokeless tobacco: Never  ?Vaping Use  ? Vaping Use: Never used  ?Substance and Sexual Activity  ? Alcohol use: Yes  ?  Comment: soc  ? Drug use: Never  ? Sexual activity: Not on file  ? ? ? ? ? ?

## 2021-11-10 ENCOUNTER — Ambulatory Visit: Payer: Medicare HMO | Admitting: Orthopaedic Surgery

## 2021-11-18 ENCOUNTER — Ambulatory Visit: Payer: Medicare HMO | Admitting: Orthopaedic Surgery

## 2021-11-27 DIAGNOSIS — E0590 Thyrotoxicosis, unspecified without thyrotoxic crisis or storm: Secondary | ICD-10-CM | POA: Diagnosis not present

## 2021-11-27 DIAGNOSIS — D72810 Lymphocytopenia: Secondary | ICD-10-CM | POA: Diagnosis not present

## 2021-11-27 DIAGNOSIS — E0500 Thyrotoxicosis with diffuse goiter without thyrotoxic crisis or storm: Secondary | ICD-10-CM | POA: Diagnosis not present

## 2021-12-02 ENCOUNTER — Ambulatory Visit: Payer: Medicare HMO | Admitting: Orthopaedic Surgery

## 2021-12-02 ENCOUNTER — Encounter: Payer: Self-pay | Admitting: Orthopaedic Surgery

## 2021-12-02 DIAGNOSIS — Z96651 Presence of right artificial knee joint: Secondary | ICD-10-CM | POA: Diagnosis not present

## 2021-12-02 DIAGNOSIS — T8484XD Pain due to internal orthopedic prosthetic devices, implants and grafts, subsequent encounter: Secondary | ICD-10-CM | POA: Diagnosis not present

## 2021-12-02 MED ORDER — TRAMADOL HCL 50 MG PO TABS: 50.0000 mg | ORAL_TABLET | Freq: Four times a day (QID) | ORAL | 0 refills | Status: DC | PRN

## 2021-12-02 NOTE — Progress Notes (Signed)
Kathleen Peters comes in today as a relates to her right total knee arthroplasty.  We replaced her knee this past October which is now getting close to 6 months since surgery.  She still has significant lateral knee pain.  There has been irritation of the lateral IT band and this is caused her pain with the knee.  She has been able to do get full motion back of that knee.  On my exam today is ligamentously stable and there is no redness and no swelling.  There is grinding of the lateral aspect of the knee at the IT band.  Previous x-rays showed no overhang of the implants themselves and I am pleased with the size and alignment.  I did have one of my partners here as a second opinion who recommended her wait this out versus an arthroscopic assessment of the lateral compartment of the knee as well as potentially a small open I&D of the IT band.  She has seen physical therapy and they have recommended her potentially getting another opinion at a tertiary center.  She is frustrated and I totally empathize with her and understand that given her pain. ? ?We did place another steroid injection along the area of tenderness to the IT band.  I would like to see her back in 4 weeks to see how she is doing overall.  I sent in some more tramadol as well. ?

## 2021-12-12 DIAGNOSIS — E0590 Thyrotoxicosis, unspecified without thyrotoxic crisis or storm: Secondary | ICD-10-CM | POA: Diagnosis not present

## 2021-12-30 ENCOUNTER — Ambulatory Visit: Payer: Medicare HMO | Admitting: Orthopaedic Surgery

## 2022-01-05 DIAGNOSIS — Z1231 Encounter for screening mammogram for malignant neoplasm of breast: Secondary | ICD-10-CM | POA: Diagnosis not present

## 2022-01-05 DIAGNOSIS — R519 Headache, unspecified: Secondary | ICD-10-CM | POA: Diagnosis not present

## 2022-01-05 DIAGNOSIS — Z01419 Encounter for gynecological examination (general) (routine) without abnormal findings: Secondary | ICD-10-CM | POA: Diagnosis not present

## 2022-01-05 DIAGNOSIS — G4700 Insomnia, unspecified: Secondary | ICD-10-CM | POA: Diagnosis not present

## 2022-01-12 ENCOUNTER — Other Ambulatory Visit: Payer: Self-pay

## 2022-01-12 ENCOUNTER — Ambulatory Visit (INDEPENDENT_AMBULATORY_CARE_PROVIDER_SITE_OTHER): Payer: Medicare HMO

## 2022-01-12 ENCOUNTER — Ambulatory Visit: Payer: Medicare HMO | Admitting: Orthopaedic Surgery

## 2022-01-12 DIAGNOSIS — G8929 Other chronic pain: Secondary | ICD-10-CM

## 2022-01-12 DIAGNOSIS — M25562 Pain in left knee: Secondary | ICD-10-CM | POA: Diagnosis not present

## 2022-01-12 DIAGNOSIS — Z96651 Presence of right artificial knee joint: Secondary | ICD-10-CM

## 2022-01-12 MED ORDER — LIDOCAINE HCL 1 % IJ SOLN
3.0000 mL | INTRAMUSCULAR | Status: AC | PRN
  Administered 2022-01-12: 3 mL

## 2022-01-12 MED ORDER — METHYLPREDNISOLONE ACETATE 40 MG/ML IJ SUSP
40.0000 mg | INTRAMUSCULAR | Status: AC | PRN
  Administered 2022-01-12: 40 mg via INTRA_ARTICULAR

## 2022-01-12 NOTE — Progress Notes (Signed)
Office Visit Note   Patient: Kathleen Peters           Date of Birth: 1951-03-21           MRN: [id number] Visit Date: 01/12/2022              Requested by: Antony Contras, MD [address],  [place] [address] PCP: Antony Contras, MD   Assessment & Plan: Visit Diagnoses:  1. Chronic pain of left knee   2. Status post total right knee replacement     Plan: I did place a steroid injection in her left knee today to calm down some of the acute pain.  She cannot have an MRI so we will obtain a CT scan of the left knee to assess hopefully the cartilage in the meniscus to the lateral aspect of her knee.  We will see her back in follow-up to go over that CT scan and to see how the steroid injection is done for her left knee.  Follow-Up Instructions: Return in about 3 weeks (around 02/02/2022).   Orders:  Orders Placed This Encounter  Procedures   Large Joint Inj   XR Knee 1-2 Views Left   No orders of the defined types were placed in this encounter.     Procedures: Large Joint Inj: L knee on 01/12/2022 10:54 AM Indications: diagnostic evaluation and pain Details: 22 G 1.5 in needle, superolateral approach  Arthrogram: No  Medications: 3 mL lidocaine 1 %; 40 mg methylPREDNISolone acetate 40 MG/ML Outcome: tolerated well, no immediate complications Procedure, treatment alternatives, risks and benefits explained, specific risks discussed. Consent was given by the patient. Immediately prior to procedure a time out was called to verify the correct patient, procedure, equipment, support staff and site/side marked as required. Patient was prepped and draped in the usual sterile fashion.      Clinical Data: No additional findings.   Subjective: Chief Complaint  Patient presents with   Right Knee - Follow-up   Left Knee - Pain  The patient comes in today with significant left knee pain to the lateral aspect of her knee and has been getting worse with time.  She  is also been dealing with her right knee replacement that has been painful ever since surgery which was performed 7 months ago.  She has had a lot of pain on the lateral aspect of the right knee.  The left knee is now been hurting significant on the left side and radiates down past the knee itself.  She has had no acute change in medical status however her husband did pass away 4 weeks ago and that has been a significant hit on her life.  HPI  Review of Systems There is currently listed no fever, chills, nausea, vomiting  Objective: Vital Signs: There were no vitals taken for this visit.  Physical Exam She is alert and oriented x3 and in no acute distress Ortho Exam Examination of her left knee shows a positive Murray sign to the lateral compartment her knee.  There is valgus malalignment.  There is a slight effusion.  There is significant lateral joint line tenderness.  The right knee has full range of motion.  There is still some grinding at the IT band laterally but there is no effusion in her knee feels ligamentously stable on the right side. Specialty Comments:  No specialty comments available.  Imaging: XR Knee 1-2 Views Left  Result Date: 01/12/2022 2 views of  the left knee shows slight valgus malalignment with lateral joint space narrowing.    PMFS History: Patient Active Problem List   Diagnosis Date Noted   Status post total right knee replacement 05/30/2021   Unilateral primary osteoarthritis, right knee 05/29/2021   Unilateral primary osteoarthritis, left knee 04/01/2021   Acute appendicitis 01/28/2021   Hyperthyroidism 04/14/2020   Heterozygous factor V Leiden mutation ([place]) 09/23/2015   Lupus anticoagulant positive 09/23/2015   Varicose veins of both lower extremities 09/23/2015   Thrombophlebitis of superficial veins of right lower extremity 04/24/2014   Past Medical History:  Diagnosis Date   Arthritis    Back pain    Cancer ([place])    hx of skin cancer    Graves disease    Hyperthyroidism    Insomnia    Migraines     Family History  Problem Relation Age of Onset   Thyroid cancer Sister     Past Surgical History:  Procedure Laterality Date   ABDOMINAL HYSTERECTOMY     ELBOW FRACTURE SURGERY     LAPAROSCOPIC APPENDECTOMY N/A 01/29/2021   Procedure: APPENDECTOMY LAPAROSCOPIC;  Surgeon: Clovis Riley, MD;  Location: [facility];  Service: General;  Laterality: N/A;   TONSILLECTOMY     TOTAL KNEE ARTHROPLASTY Right 05/30/2021   Procedure: RIGHT TOTAL KNEE ARTHROPLASTY;  Surgeon: Mcarthur Rossetti, MD;  Location: [facility];  Service: Orthopedics;  Laterality: Right;   TYMPANOSTOMY TUBE PLACEMENT     Social History   Occupational History   Not on file  Tobacco Use   Smoking status: Never   Smokeless tobacco: Never  Vaping Use   Vaping Use: Never used  Substance and Sexual Activity   Alcohol use: Yes    Comment: soc   Drug use: Never   Sexual activity: Not on file

## 2022-01-14 ENCOUNTER — Telehealth: Payer: Self-pay | Admitting: *Deleted

## 2022-01-14 ENCOUNTER — Other Ambulatory Visit: Payer: Self-pay | Admitting: Orthopaedic Surgery

## 2022-01-14 MED ORDER — TRAMADOL HCL 50 MG PO TABS: 50.0000 mg | ORAL_TABLET | Freq: Four times a day (QID) | ORAL | 0 refills | Status: DC | PRN

## 2022-01-14 NOTE — Telephone Encounter (Signed)
Call to patient and updated on response from MD.

## 2022-01-14 NOTE — Telephone Encounter (Signed)
Patient called and asked for refill of Tramadol- she forgot when she was here earlier in the week. She also asked if a CT of her Right knee would be possible since she is going for one for her Left knee at your recommendation? Thank you.

## 2022-01-15 ENCOUNTER — Ambulatory Visit: Payer: Self-pay | Admitting: Physical Therapy

## 2022-01-15 DIAGNOSIS — N958 Other specified menopausal and perimenopausal disorders: Secondary | ICD-10-CM | POA: Diagnosis not present

## 2022-01-15 DIAGNOSIS — Z79890 Hormone replacement therapy (postmenopausal): Secondary | ICD-10-CM | POA: Diagnosis not present

## 2022-01-15 DIAGNOSIS — E0590 Thyrotoxicosis, unspecified without thyrotoxic crisis or storm: Secondary | ICD-10-CM | POA: Diagnosis not present

## 2022-01-15 DIAGNOSIS — Z8262 Family history of osteoporosis: Secondary | ICD-10-CM | POA: Diagnosis not present

## 2022-01-15 DIAGNOSIS — M8588 Other specified disorders of bone density and structure, other site: Secondary | ICD-10-CM | POA: Diagnosis not present

## 2022-01-21 ENCOUNTER — Ambulatory Visit
Admission: RE | Admit: 2022-01-21 | Discharge: 2022-01-21 | Disposition: A | Discharge status: Home / Self Care | Payer: Medicare HMO | Source: Ambulatory Visit | Attending: Orthopaedic Surgery | Admitting: Orthopaedic Surgery

## 2022-01-21 DIAGNOSIS — G8929 Other chronic pain: Secondary | ICD-10-CM

## 2022-01-21 DIAGNOSIS — S8992XA Unspecified injury of left lower leg, initial encounter: Secondary | ICD-10-CM | POA: Diagnosis not present

## 2022-01-21 DIAGNOSIS — M7122 Synovial cyst of popliteal space [Baker], left knee: Secondary | ICD-10-CM | POA: Diagnosis not present

## 2022-01-21 DIAGNOSIS — M25462 Effusion, left knee: Secondary | ICD-10-CM | POA: Diagnosis not present

## 2022-01-23 IMAGING — CT CT ABDOMEN WO/W CM
2 of 11 series · 11 of 46 positions shown, 16 images · IV contrast (iopamidol)
Comparison: 02/23/2019 CT abdomen.

CLINICAL DATA: Follow-up pancreatic, renal and liver cysts.

EXAM:
CT ABDOMEN WITHOUT AND WITH CONTRAST
TECHNIQUE: Multidetector CT imaging of the abdomen was performed following the
standard protocol before and following the bolus administration of
intravenous contrast.
CONTRAST:  80mL BQR71K-3TT IOPAMIDOL (BQR71K-3TT) INJECTION 61%

[Series 7: arterial phase 2.00 br36 s3 cor arterial · coronal · arterial · 0.52mm/px · 3 of 124 slices shown]
[im 31/124  soft-tissue]
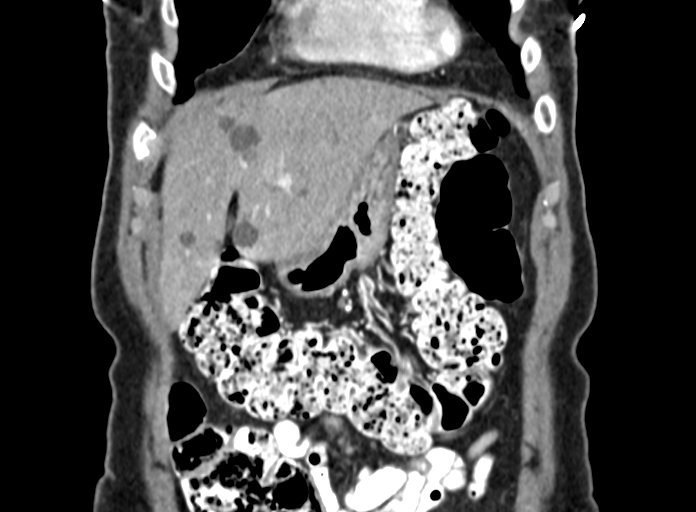
[im 62/124  soft-tissue]
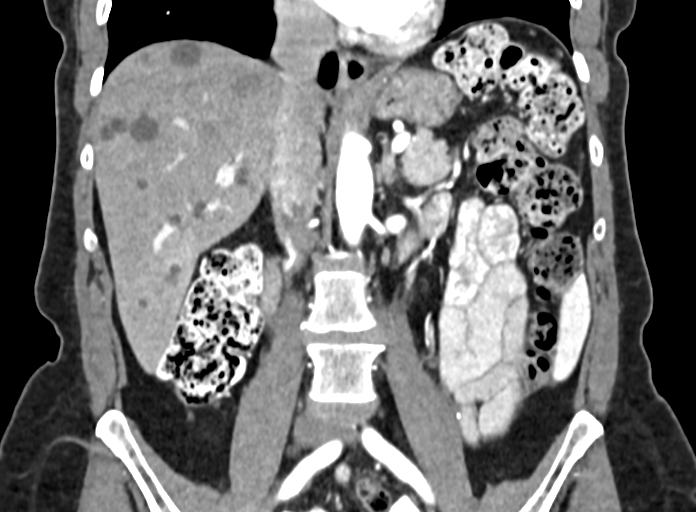
[im 93/124  soft-tissue]
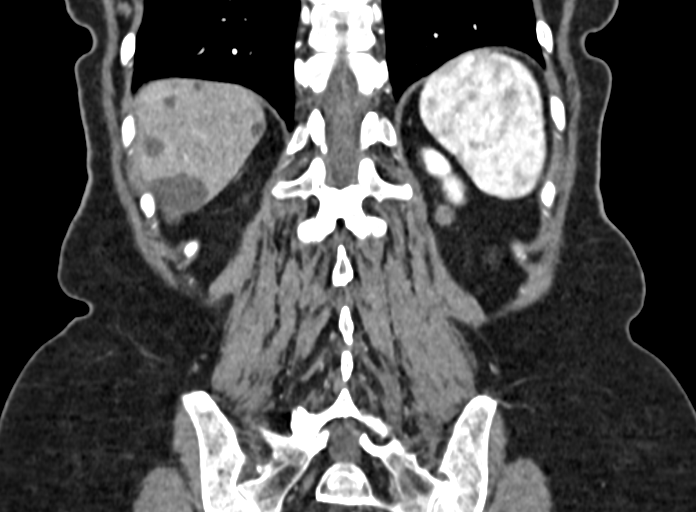

[Series 11: venous phase 2.00 br40 s3 pancreas venous · axial · portal-venous · 0.71mm/px · z∈[+1363,+1571]mm · 8 of 134 slices shown, 13 images]
[im 15/134  soft-tissue]
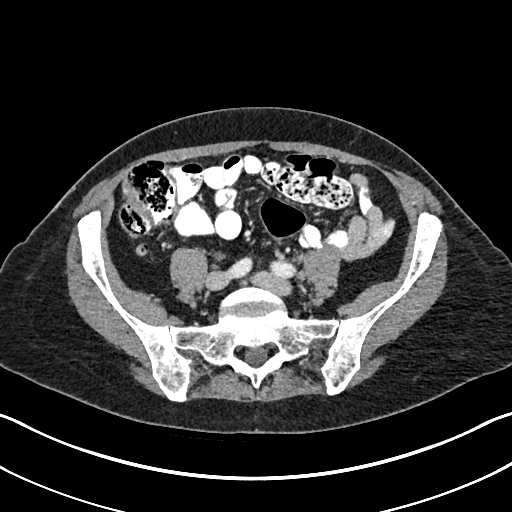
[im 15/134  bone]
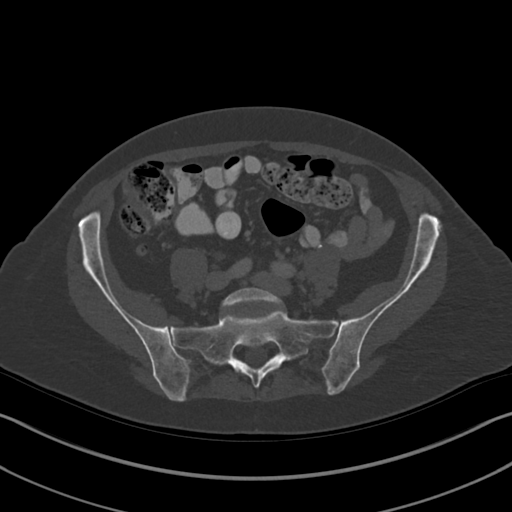
[im 30/134  soft-tissue]
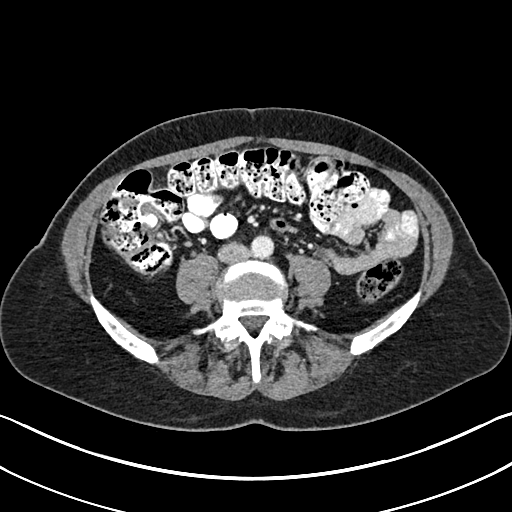
[im 45/134  soft-tissue]
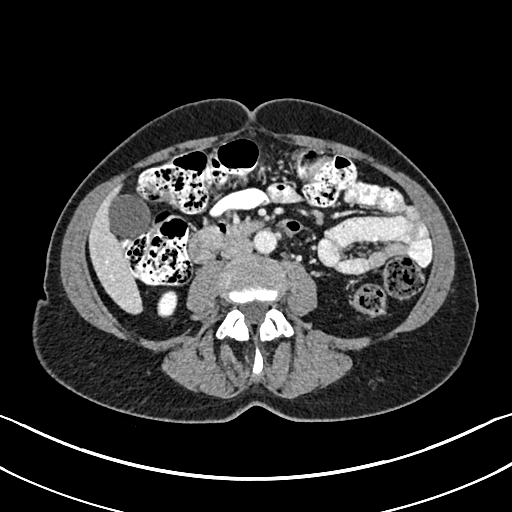
[im 60/134  soft-tissue]
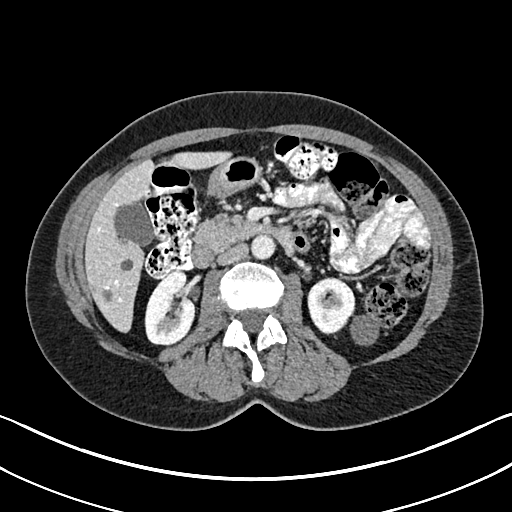
[im 74/134  soft-tissue]
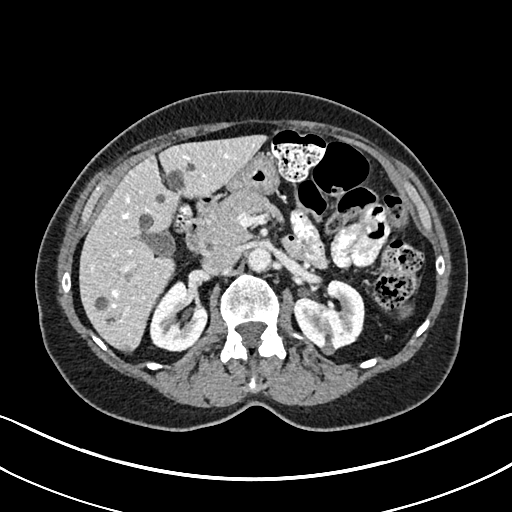
[im 74/134  lung]
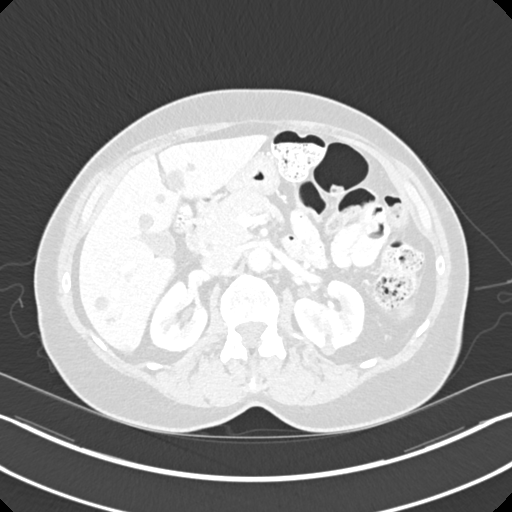
[im 89/134  soft-tissue]
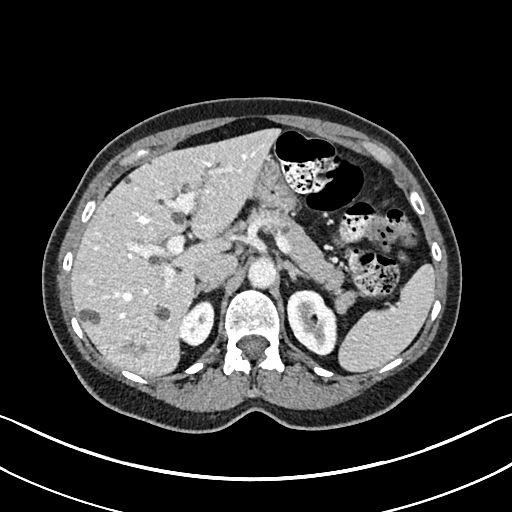
[im 89/134  lung]
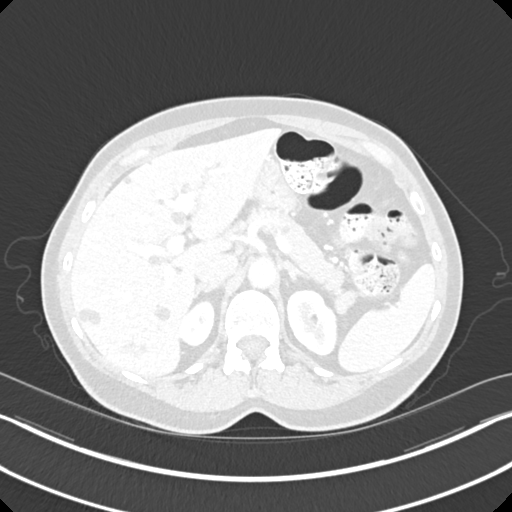
[im 104/134  soft-tissue]
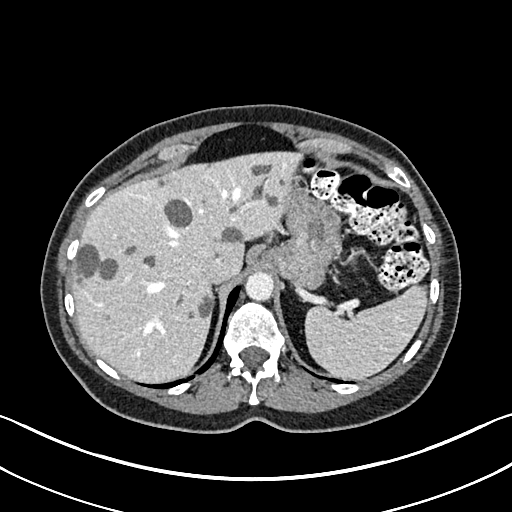
[im 104/134  lung]
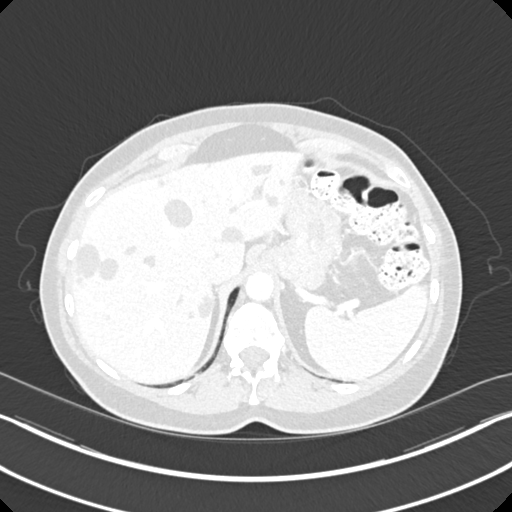
[im 119/134  soft-tissue]
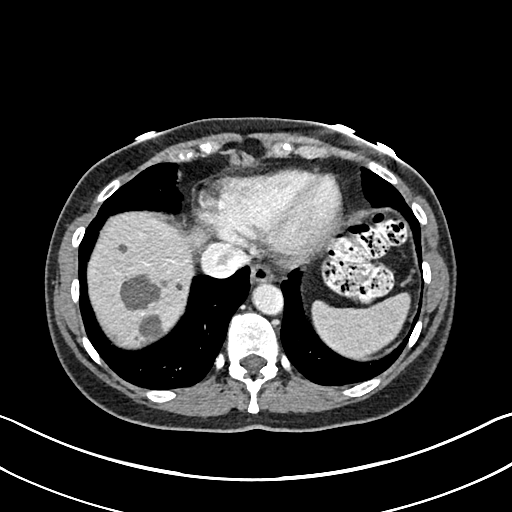
[im 119/134  lung]
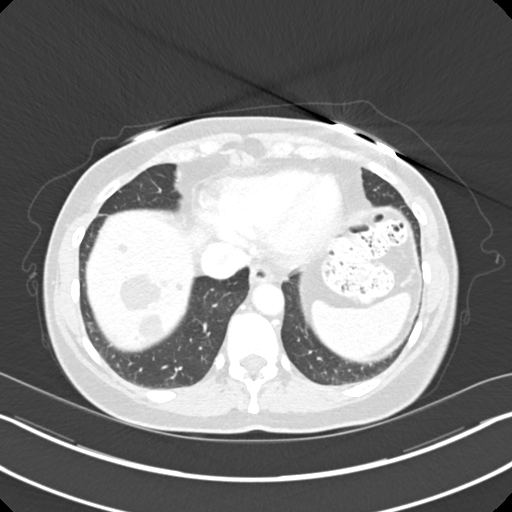

[11 of 46 positions shown; findings below may reference images not displayed]

FINDINGS: Lower chest: No significant pulmonary nodules or acute consolidative
airspace disease.

Hepatobiliary: Normal liver size. Innumerable (greater than 10)
benign-appearing liver cysts scattered throughout the liver, several
which demonstrated lobulated outer contour and a few of which
demonstrate thin internal septations. Representative lobulated 3.1 x
3.0 cm right liver dome cyst with thin internal septation (series
11/image 14), previously 3.1 x 3.0 cm, stable. Innumerable
subcentimeter hypodense liver lesions scattered throughout the
liver, too small to characterize, not definitely changed. No
appreciable new or suspicious liver masses. Normal gallbladder with
no radiopaque cholelithiasis. No biliary ductal dilatation.

Pancreas: No pancreatic duct dilation. Tiny low-attenuation 0.6 cm
pancreatic body lesion (series 4/image 45), stable in size, without
appreciable enhancement. No additional pancreatic lesions. No
pancreatic duct dilation.

Spleen: Normal size. No mass.

Adrenals/Urinary Tract: Normal adrenals. No renal stones. No
hydronephrosis. There are 2 isodense exophytic renal cortical
lesions in the lateral lower left kidney, largest 2.6 cm (series
4/image 67), demonstrating no enhancement, stable in size,
compatible with Bosniak category 2 hemorrhagic/proteinaceous renal
cysts. Several additional subcentimeter low-attenuation renal
cortical lesions scattered in the left kidney are too small to
characterize and are not appreciably changed. No right renal masses.

Stomach/Bowel: Normal non-distended stomach. Visualized small and
large bowel is normal caliber, with no bowel wall thickening.

Vascular/Lymphatic: Normal caliber abdominal aorta. Patent portal,
splenic, hepatic and renal veins. No pathologically enlarged lymph
nodes in the abdomen.

Other: No pneumoperitoneum, ascites or focal fluid collection.

Musculoskeletal: No aggressive appearing focal osseous lesions.
IMPRESSION: 1. No overtly suspicious renal masses. Stable benign Bosniak
category 2 hemorrhagic/proteinaceous renal cysts in the left kidney.
Subcentimeter low-attenuation left renal lesions are stable in size
and too small to accurately characterize.
2. Stable nonaggressive subcentimeter low-attenuation pancreatic
body lesion. Follow-up MRI (preferred) or CT abdomen without and
with IV contrast is indicated in 2 years. This recommendation
follows ACR consensus guidelines: Management of Incidental
Pancreatic Cysts: A White Paper of the ACR Incidental Findings
Committee. [HOSPITAL] [id number];[DATE].
3. No biliary or pancreatic duct dilation.
4. Innumerable benign-appearing liver cysts.

## 2022-02-02 ENCOUNTER — Telehealth: Payer: Self-pay

## 2022-02-02 ENCOUNTER — Encounter: Payer: Self-pay | Admitting: Orthopaedic Surgery

## 2022-02-02 ENCOUNTER — Ambulatory Visit: Payer: Medicare HMO | Admitting: Orthopaedic Surgery

## 2022-02-02 DIAGNOSIS — Z96651 Presence of right artificial knee joint: Secondary | ICD-10-CM

## 2022-02-02 DIAGNOSIS — M25562 Pain in left knee: Secondary | ICD-10-CM

## 2022-02-02 DIAGNOSIS — M1712 Unilateral primary osteoarthritis, left knee: Secondary | ICD-10-CM | POA: Diagnosis not present

## 2022-02-02 DIAGNOSIS — G8929 Other chronic pain: Secondary | ICD-10-CM | POA: Diagnosis not present

## 2022-02-02 DIAGNOSIS — M25561 Pain in right knee: Secondary | ICD-10-CM | POA: Diagnosis not present

## 2022-02-02 MED ORDER — HYDROCODONE-ACETAMINOPHEN 5-325 MG PO TABS: 1.0000 | ORAL_TABLET | Freq: Three times a day (TID) | ORAL | 0 refills | Status: DC | PRN

## 2022-02-02 NOTE — Telephone Encounter (Signed)
Left knee gel injection ?

## 2022-02-02 NOTE — Telephone Encounter (Signed)
Noted  

## 2022-02-02 NOTE — Progress Notes (Signed)
The patient comes in today for continued follow-up as it relates to her painful right total knee arthroplasty.  She has had excellent range of motion since surgery but has had significant pain over the IT band and the lateral aspect of her right knee.  Her x-rays show good placement and good alignment of the knee replacement.  There has been pain over the IT band that did subside some with injections on that side of her knee with a steroid.  She is also had a CT scan recently of her left knee.  She cannot have an MRI due to an implant.  The CT scan of her left knee is reviewed with her today and it does show moderate to severe arthritis of the left knee that is tricompartmental and mainly in the lateral compartment of her knee.  She is now having worsening left knee pain as a relates to trying to recover from her right knee replacement and this is created back pain.  She is a very active 71 year old.  It hurts her quite a bit going up and down stairs and walking.  Examination of her left knee today shows slight valgus malalignment with global tenderness and pain especially of the lateral joint line.  Her right knee has excellent range of motion with the swelling is subsiding.  There is some crepitation over the IT band distally and pain over the lateral aspect of her right total knee arthroplasty.  There is no significant numbness and tingling.  She has tried Neurontin in the past and that has not helped.  She is requesting a refill of hydrocodone which she does take sparingly.  I will send her to [facility] for physical therapy for her right knee and mainly the IT band with any modalities that can hopefully help this get better.  I do feel that she is a candidate for hyaluronic acid for her left knee given the arthritis that is seen on the CT scan of her left knee combined with the failure of conservative treatment including anti-inflammatories, physical therapy and a steroid injection.  I have also  recommended likely a second opinion for her right knee as well.

## 2022-02-04 ENCOUNTER — Encounter: Payer: Self-pay | Admitting: Physical Therapy

## 2022-02-04 ENCOUNTER — Ambulatory Visit: Payer: Medicare HMO | Attending: Obstetrics and Gynecology | Admitting: Physical Therapy

## 2022-02-04 DIAGNOSIS — R279 Unspecified lack of coordination: Secondary | ICD-10-CM | POA: Insufficient documentation

## 2022-02-04 DIAGNOSIS — M6281 Muscle weakness (generalized): Secondary | ICD-10-CM | POA: Diagnosis not present

## 2022-02-04 NOTE — Therapy (Addendum)
OUTPATIENT PHYSICAL THERAPY FEMALE PELVIC EVALUATION   Patient Name: Kathleen Peters MRN: [id number] DOB:[birth year], 71 y.o., female Today's Date: 02/04/2022   PT End of Session - 02/04/22 1102     Visit Number 1    Date for PT Re-Evaluation 04/29/22    Authorization Type [employer]    PT Start Time 1100    PT Stop Time 1137    PT Time Calculation (min) 37 min    Activity Tolerance Patient tolerated treatment well    Behavior During Therapy WFL for tasks assessed/performed             Past Medical History:  Diagnosis Date   Arthritis    Back pain    Cancer ([place])    hx of skin cancer   Graves disease    Hyperthyroidism    Insomnia    Migraines    Past Surgical History:  Procedure Laterality Date   ABDOMINAL HYSTERECTOMY     ELBOW FRACTURE SURGERY     LAPAROSCOPIC APPENDECTOMY N/A 01/29/2021   Procedure: APPENDECTOMY LAPAROSCOPIC;  Surgeon: Clovis Riley, MD;  Location: [facility];  Service: General;  Laterality: N/A;   TONSILLECTOMY     TOTAL KNEE ARTHROPLASTY Right 05/30/2021   Procedure: RIGHT TOTAL KNEE ARTHROPLASTY;  Surgeon: Mcarthur Rossetti, MD;  Location: [facility];  Service: Orthopedics;  Laterality: Right;   TYMPANOSTOMY TUBE PLACEMENT     Patient Active Problem List   Diagnosis Date Noted   Status post total right knee replacement 05/30/2021   Unilateral primary osteoarthritis, right knee 05/29/2021   Unilateral primary osteoarthritis, left knee 04/01/2021   Acute appendicitis 01/28/2021   Hyperthyroidism 04/14/2020   Heterozygous factor V Leiden mutation ([place]) 09/23/2015   Lupus anticoagulant positive 09/23/2015   Varicose veins of both lower extremities 09/23/2015   Thrombophlebitis of superficial veins of right lower extremity 04/24/2014    PCP: Antony Contras, MD  REFERRING PROVIDER: Molli Posey, MD  REFERRING DIAG: N39.3 (ICD-10-CM) - Stress incontinence (female) (female)  THERAPY DIAG:  Muscle weakness (generalized)  Unspecified lack of  coordination  Rationale for Evaluation and Treatment Rehabilitation  ONSET DATE: over the last few years, worse since April  SUBJECTIVE:                                                                                                                                                                                           SUBJECTIVE STATEMENT: I have had a small problem over the years.  I had a bad cough end of April and leaked very badly then.  Since then it happens just when coughing a lot. And it leaks  through my clothes.  Fluid intake: Yes: 8 glasses     PAIN:  Are you having pain? Yes NPRS scale: 2/10 Pain location:  back  Pain type: aching Pain description:  intermittent    Aggravating factors: being up for a while Relieving factors: rest  PRECAUTIONS: None  WEIGHT BEARING RESTRICTIONS No  FALLS:  Has patient fallen in last 6 months? No  LIVING ENVIRONMENT: Lives with: lives alone Lives in: House/apartment   OCCUPATION: retired  PLOF: Independent  PATIENT GOALS not have the leakage when  PERTINENT HISTORY:  S/P Right total knee arthroplasty on 05/30/21  History of skin cancer, Hyperthyroidism, abdomial hysterectomy, appendectomy, migrains, Lt knee pain Sexual abuse: No  BOWEL MOVEMENT Pain with bowel movement: No Type of bowel movement:Frequency daily or every other day  and Strain No Fully empty rectum: Yes:     URINATION Pain with urination: No Fully empty bladder: Yes:   Stream:  both strong and weak and sometimes not as strong as it seems Urgency: No Frequency: 2-3 hours Leakage: Coughing Pads: No not usually  INTERCOURSE Pain with intercourse: not active  PREGNANCY none  PROLAPSE None    OBJECTIVE:   PATIENT SURVEYS:    COGNITION:  Overall cognitive status: Within functional limits for tasks assessed     MUSCLE LENGTH: Hamstrings: Right 80 deg; Left 70 deg   LUMBAR SPECIAL TESTS:  Straight leg raise test:  Negative  FUNCTIONAL TESTS:  Single leg stand - mild trendelenburg bil  GAIT:  Comments: slightly antalgic and decreased knee ext Lt               POSTURE: rounded shoulders, forward head, increased thoracic kyphosis, and flexed trunk    PELVIC ALIGNMENT:  LUMBARAROM/PROM  A/PROM A/PROM  eval  Flexion WFL  Extension   Right lateral flexion   Left lateral flexion   Right rotation   Left rotation    (Blank rows = not tested)  LOWER EXTREMITY ROM:  Passive ROM Right eval Left eval  Hip flexion 80% 80%  Hip extension    Hip abduction    Hip adduction    Hip internal rotation  50% +pain  Hip external rotation [facility]  Knee flexion    Knee extension    Ankle dorsiflexion    Ankle plantarflexion    Ankle inversion    Ankle eversion     (Blank rows = not tested)  LOWER EXTREMITY MMT:  MMT Right eval Left eval  Hip flexion 4/5 5/5  Hip extension  5/5  Hip abduction 4/5 4/5  Hip adduction  4/5  Hip internal rotation    Hip external rotation    Knee flexion    Knee extension    Ankle dorsiflexion    Ankle plantarflexion    Ankle inversion    Ankle eversion      PALPATION:   General  thoracic and lumbar tight; palpated adductor and gluteal tension during kegel;                 External Perineal Exam normal                             Internal Pelvic Floor more tone on the right, smooth vaginal canal, bladder dropping during cough; can hold 5 sec 1x; can do 2 reps of kegel in a row  Patient confirms identification and approves PT to assess internal pelvic floor and treatment Yes  PELVIC  MMT:   MMT eval  Vaginal 3/5  Internal Anal Sphincter   External Anal Sphincter   Puborectalis   Diastasis Recti   (Blank rows = not tested)        TONE: normal  PROLAPSE: Not noticed in supine  TODAY'S TREATMENT  Date: 02/04/22 HEP established-see below  Understands how to kegel and to do without co-contraction in supine   PATIENT EDUCATION:  Education  details: Access Code: [email or web address] Person educated: Patient Education method: Explanation, Demonstration, Verbal cues, and Handouts Education comprehension: verbalized understanding   HOME EXERCISE PROGRAM: Access Code: [email or web address] URL: [email or web address] Date: 02/04/2022 Prepared by: Jari Favre  Exercises - Ball squeeze with Kegel  - 3 x daily - 7 x weekly - 1 sets - 10 reps - 3 sec hold  ASSESSMENT:  CLINICAL IMPRESSION: Patient is a very friendly 71 y.o. female who was seen today for physical therapy evaluation and treatment for stress incontinence. Pt secondarily has knee and back pain and recently widowed.  She has not been able to walk as much over the last 3 years due to knee and back pain.  Pt demonstrates pelvic floor weakness 3/5 and lowered endurance of only 5 sec for one rep.  Can do two quick flicks before she fatigues.  Pt was not able to maintain kegel during a cough.  Pt has posture as noted above.  Palpation of pelvic floor also reveals more tension on the right side.  When prompted to cough, bladder descending is palpable and closure not palpated of the pelvic floor.  Pt will benefit from skilled PT to address all impairments and improved function in order to be able to do functional activities without leakage   OBJECTIVE IMPAIRMENTS Abnormal gait, decreased balance, decreased coordination, decreased endurance, decreased ROM, decreased strength, increased muscle spasms, impaired flexibility, postural dysfunction, and pain.   ACTIVITY LIMITATIONS continence, toileting, and couging/sneezing  PARTICIPATION LIMITATIONS: community activity  PERSONAL FACTORS 3+ comorbidities: S/P Right total knee arthroplasty on 05/30/21 , History of skin cancer, Hyperthyroidism, abdomial hysterectomy, appendectomy, migrains, Lt knee pain  are also affecting patient's functional outcome.   REHAB POTENTIAL: Excellent  CLINICAL DECISION MAKING: Evolving/moderate  complexity  EVALUATION COMPLEXITY: Moderate   GOALS: Goals reviewed with patient? Yes  SHORT TERM GOALS: Target date: 03/04/2022  Ind with initial HEP Baseline: Goal status: INITIAL  2.  Pt will be able to knack Baseline:  Goal status: INITIAL   LONG TERM GOALS: Target date: 04/29/2022   Pt will be independent with advanced HEP to maintain improvements made throughout therapy  Baseline:  Goal status: INITIAL  2.  Pt will be able to functional actions such as coughing without leakage  Baseline:  Goal status: INITIAL  3.  Pt will be able sustain kegel without co-contracting for at least 15 seconds so she can not have leakage during a coughing fit.  Baseline:  Goal status: INITIAL  4.  Pt will be able to perform at least 10 quick flicks at 3/5 or more MMT for prevention of leakage during functional activities Baseline:  Goal status: INITIAL    PLAN: PT FREQUENCY: 1x/week  PT DURATION: 12 weeks  PLANNED INTERVENTIONS: Therapeutic exercises, Therapeutic activity, Neuromuscular re-education, Balance training, Gait training, Patient/Family education, Joint mobilization, Dry Needling, Electrical stimulation, Cryotherapy, Moist heat, Taping, Biofeedback, and Manual therapy  PLAN FOR NEXT SESSION: review kegel and knack; exhale with kegel and transversus abdominus activating correctly; discuss hormones and pelvic floor tissue health   Camillo Flaming Keonta Monceaux, PT 02/04/2022,  12:53 PM

## 2022-02-10 ENCOUNTER — Telehealth: Payer: Self-pay

## 2022-02-10 NOTE — Telephone Encounter (Signed)
VOB submitted for Monovisc, left knee. BV pending.

## 2022-02-16 DIAGNOSIS — Z96651 Presence of right artificial knee joint: Secondary | ICD-10-CM | POA: Diagnosis not present

## 2022-02-16 DIAGNOSIS — M1712 Unilateral primary osteoarthritis, left knee: Secondary | ICD-10-CM | POA: Diagnosis not present

## 2022-02-16 DIAGNOSIS — M25561 Pain in right knee: Secondary | ICD-10-CM | POA: Diagnosis not present

## 2022-02-17 ENCOUNTER — Other Ambulatory Visit: Payer: Self-pay

## 2022-02-17 DIAGNOSIS — M1712 Unilateral primary osteoarthritis, left knee: Secondary | ICD-10-CM

## 2022-02-18 ENCOUNTER — Ambulatory Visit (INDEPENDENT_AMBULATORY_CARE_PROVIDER_SITE_OTHER): Payer: Medicare HMO | Admitting: Orthopaedic Surgery

## 2022-02-18 ENCOUNTER — Encounter: Payer: Self-pay | Admitting: Orthopaedic Surgery

## 2022-02-18 DIAGNOSIS — M1712 Unilateral primary osteoarthritis, left knee: Secondary | ICD-10-CM | POA: Diagnosis not present

## 2022-02-18 MED ORDER — HYALURONAN 88 MG/4ML IX SOSY
88.0000 mg | PREFILLED_SYRINGE | INTRA_ARTICULAR | Status: AC | PRN
  Administered 2022-02-18: 88 mg via INTRA_ARTICULAR

## 2022-02-18 MED ORDER — LIDOCAINE HCL 1 % IJ SOLN
3.0000 mL | INTRAMUSCULAR | Status: AC | PRN
  Administered 2022-02-18: 3 mL

## 2022-02-18 MED ORDER — HYDROCODONE-ACETAMINOPHEN 5-325 MG PO TABS: 1.0000 | ORAL_TABLET | Freq: Three times a day (TID) | ORAL | 0 refills | Status: DC | PRN

## 2022-02-18 NOTE — Progress Notes (Signed)
   Procedure Note  Patient: Kathleen Peters             Date of Birth: [birth year]           MRN: [id number]             Visit Date: 02/18/2022  Procedures: Visit Diagnoses:  1. Unilateral primary osteoarthritis, left knee     Large Joint Inj on 02/18/2022 11:19 AM Indications: diagnostic evaluation and pain Details: 22 G 1.5 in needle, superolateral approach  Arthrogram: No  Medications: 3 mL lidocaine 1 %; 88 mg Hyaluronan 88 MG/4ML Outcome: tolerated well, no immediate complications Procedure, treatment alternatives, risks and benefits explained, specific risks discussed. Consent was given by the patient. Immediately prior to procedure a time out was called to verify the correct patient, procedure, equipment, support staff and site/side marked as required. Patient was prepped and draped in the usual sterile fashion.    The patient comes in today for hyaluronic acid with Monovisc for her left knee to treat the pain from osteoarthritis.  Her right total knee arthroplasty is still very painful to her and it wakes her up at night.  The left knee has well-documented osteoarthritis of the this knee and is failed conservative treatment including steroid injections.  I did place Monovisc in the left knee without difficulty.  I would like to see her back in 6 weeks to see how she is doing overall.  The only other treatment for the left knee would be a knee replacement.  However I understand how cautious she is given the fact that she still hurts on a daily basis with her right total knee arthroplasty.  On my exam of that right knee today and has full range of motion with no effusion and is ligamentously stable.  I have told her again that I am not opposed to her seeking a second opinion outside of our office just to make sure nothing is being missed.  Also let her know that a bone scan is not warranted until some months at least 18 months out from surgery to rule out prosthetic loosening but her plain  films did not show that.

## 2022-02-20 ENCOUNTER — Ambulatory Visit: Payer: Medicare HMO | Admitting: Physical Therapy

## 2022-03-02 ENCOUNTER — Ambulatory Visit: Payer: Medicare HMO | Admitting: Physical Therapy

## 2022-03-05 DIAGNOSIS — G8929 Other chronic pain: Secondary | ICD-10-CM | POA: Diagnosis not present

## 2022-03-05 DIAGNOSIS — M1712 Unilateral primary osteoarthritis, left knee: Secondary | ICD-10-CM | POA: Diagnosis not present

## 2022-03-05 DIAGNOSIS — M25561 Pain in right knee: Secondary | ICD-10-CM | POA: Diagnosis not present

## 2022-03-05 DIAGNOSIS — Z96651 Presence of right artificial knee joint: Secondary | ICD-10-CM | POA: Diagnosis not present

## 2022-03-09 ENCOUNTER — Telehealth: Payer: Self-pay | Admitting: *Deleted

## 2022-03-09 ENCOUNTER — Other Ambulatory Visit: Payer: Self-pay | Admitting: Orthopaedic Surgery

## 2022-03-09 MED ORDER — TRAMADOL HCL 50 MG PO TABS: 50.0000 mg | ORAL_TABLET | Freq: Four times a day (QID) | ORAL | 0 refills | Status: DC | PRN

## 2022-03-09 MED ORDER — IBUPROFEN 800 MG PO TABS: 800.0000 mg | ORAL_TABLET | Freq: Three times a day (TID) | ORAL | 1 refills | Status: DC | PRN

## 2022-03-09 NOTE — Telephone Encounter (Signed)
Patient called and left VM stating the shot given in her left knee did not provide any relief. Also her right knee "remains as painful as ever and pain with every step". She would like to a refill of Tramadol and 800 mg Ibuprofen if she could. She just wanted you aware. Thanks.

## 2022-03-16 ENCOUNTER — Ambulatory Visit: Payer: Medicare HMO | Admitting: Physical Therapy

## 2022-03-30 ENCOUNTER — Encounter: Payer: Medicare HMO | Admitting: Physical Therapy

## 2022-04-06 ENCOUNTER — Encounter: Payer: Medicare HMO | Admitting: Physical Therapy

## 2022-04-07 DIAGNOSIS — M25562 Pain in left knee: Secondary | ICD-10-CM | POA: Diagnosis not present

## 2022-04-07 DIAGNOSIS — G8929 Other chronic pain: Secondary | ICD-10-CM | POA: Diagnosis not present

## 2022-04-07 DIAGNOSIS — Z96651 Presence of right artificial knee joint: Secondary | ICD-10-CM | POA: Diagnosis not present

## 2022-04-07 DIAGNOSIS — M1712 Unilateral primary osteoarthritis, left knee: Secondary | ICD-10-CM | POA: Diagnosis not present

## 2022-04-07 DIAGNOSIS — T8484XA Pain due to internal orthopedic prosthetic devices, implants and grafts, initial encounter: Secondary | ICD-10-CM | POA: Diagnosis not present

## 2022-04-08 ENCOUNTER — Ambulatory Visit: Payer: Medicare HMO | Admitting: Orthopaedic Surgery

## 2022-04-10 DIAGNOSIS — M8588 Other specified disorders of bone density and structure, other site: Secondary | ICD-10-CM | POA: Diagnosis not present

## 2022-04-10 DIAGNOSIS — G43909 Migraine, unspecified, not intractable, without status migrainosus: Secondary | ICD-10-CM | POA: Diagnosis not present

## 2022-04-10 DIAGNOSIS — E1169 Type 2 diabetes mellitus with other specified complication: Secondary | ICD-10-CM | POA: Diagnosis not present

## 2022-04-10 DIAGNOSIS — E7800 Pure hypercholesterolemia, unspecified: Secondary | ICD-10-CM | POA: Diagnosis not present

## 2022-04-10 DIAGNOSIS — M1712 Unilateral primary osteoarthritis, left knee: Secondary | ICD-10-CM | POA: Diagnosis not present

## 2022-04-10 DIAGNOSIS — G4700 Insomnia, unspecified: Secondary | ICD-10-CM | POA: Diagnosis not present

## 2022-04-10 DIAGNOSIS — E611 Iron deficiency: Secondary | ICD-10-CM | POA: Diagnosis not present

## 2022-04-10 DIAGNOSIS — D649 Anemia, unspecified: Secondary | ICD-10-CM | POA: Diagnosis not present

## 2022-04-10 DIAGNOSIS — Z0000 Encounter for general adult medical examination without abnormal findings: Secondary | ICD-10-CM | POA: Diagnosis not present

## 2022-04-10 DIAGNOSIS — E0590 Thyrotoxicosis, unspecified without thyrotoxic crisis or storm: Secondary | ICD-10-CM | POA: Diagnosis not present

## 2022-04-10 DIAGNOSIS — Z1331 Encounter for screening for depression: Secondary | ICD-10-CM | POA: Diagnosis not present

## 2022-04-13 ENCOUNTER — Encounter: Payer: Medicare HMO | Admitting: Physical Therapy

## 2022-04-17 DIAGNOSIS — T8484XA Pain due to internal orthopedic prosthetic devices, implants and grafts, initial encounter: Secondary | ICD-10-CM | POA: Diagnosis not present

## 2022-04-17 DIAGNOSIS — Z96651 Presence of right artificial knee joint: Secondary | ICD-10-CM | POA: Diagnosis not present

## 2022-05-14 DIAGNOSIS — E0590 Thyrotoxicosis, unspecified without thyrotoxic crisis or storm: Secondary | ICD-10-CM | POA: Diagnosis not present

## 2022-05-26 DIAGNOSIS — E0590 Thyrotoxicosis, unspecified without thyrotoxic crisis or storm: Secondary | ICD-10-CM | POA: Diagnosis not present

## 2022-05-26 DIAGNOSIS — Z808 Family history of malignant neoplasm of other organs or systems: Secondary | ICD-10-CM | POA: Diagnosis not present

## 2022-05-26 DIAGNOSIS — E0500 Thyrotoxicosis with diffuse goiter without thyrotoxic crisis or storm: Secondary | ICD-10-CM | POA: Diagnosis not present

## 2022-05-28 DIAGNOSIS — Z23 Encounter for immunization: Secondary | ICD-10-CM | POA: Diagnosis not present

## 2022-06-04 DIAGNOSIS — M1712 Unilateral primary osteoarthritis, left knee: Secondary | ICD-10-CM | POA: Diagnosis not present

## 2022-06-11 ENCOUNTER — Encounter: Payer: Self-pay | Admitting: Orthopaedic Surgery

## 2022-07-10 DIAGNOSIS — E0500 Thyrotoxicosis with diffuse goiter without thyrotoxic crisis or storm: Secondary | ICD-10-CM | POA: Diagnosis not present

## 2022-07-10 DIAGNOSIS — E0590 Thyrotoxicosis, unspecified without thyrotoxic crisis or storm: Secondary | ICD-10-CM | POA: Diagnosis not present

## 2022-07-23 DIAGNOSIS — R7989 Other specified abnormal findings of blood chemistry: Secondary | ICD-10-CM | POA: Diagnosis not present

## 2022-08-03 DIAGNOSIS — E0500 Thyrotoxicosis with diffuse goiter without thyrotoxic crisis or storm: Secondary | ICD-10-CM | POA: Diagnosis not present

## 2022-08-25 DIAGNOSIS — M1712 Unilateral primary osteoarthritis, left knee: Secondary | ICD-10-CM | POA: Diagnosis not present

## 2022-09-03 DIAGNOSIS — E0500 Thyrotoxicosis with diffuse goiter without thyrotoxic crisis or storm: Secondary | ICD-10-CM | POA: Diagnosis not present

## 2022-09-03 DIAGNOSIS — E0590 Thyrotoxicosis, unspecified without thyrotoxic crisis or storm: Secondary | ICD-10-CM | POA: Diagnosis not present

## 2022-09-03 DIAGNOSIS — Z808 Family history of malignant neoplasm of other organs or systems: Secondary | ICD-10-CM | POA: Diagnosis not present

## 2022-09-07 DIAGNOSIS — Z85828 Personal history of other malignant neoplasm of skin: Secondary | ICD-10-CM | POA: Diagnosis not present

## 2022-09-07 DIAGNOSIS — L821 Other seborrheic keratosis: Secondary | ICD-10-CM | POA: Diagnosis not present

## 2022-09-07 DIAGNOSIS — D2271 Melanocytic nevi of right lower limb, including hip: Secondary | ICD-10-CM | POA: Diagnosis not present

## 2022-09-07 DIAGNOSIS — L718 Other rosacea: Secondary | ICD-10-CM | POA: Diagnosis not present

## 2022-09-07 DIAGNOSIS — L570 Actinic keratosis: Secondary | ICD-10-CM | POA: Diagnosis not present

## 2022-09-07 DIAGNOSIS — B351 Tinea unguium: Secondary | ICD-10-CM | POA: Diagnosis not present

## 2022-09-07 DIAGNOSIS — D2272 Melanocytic nevi of left lower limb, including hip: Secondary | ICD-10-CM | POA: Diagnosis not present

## 2022-09-07 DIAGNOSIS — D225 Melanocytic nevi of trunk: Secondary | ICD-10-CM | POA: Diagnosis not present

## 2022-09-16 DIAGNOSIS — Z96652 Presence of left artificial knee joint: Secondary | ICD-10-CM | POA: Diagnosis not present

## 2022-09-16 DIAGNOSIS — M1712 Unilateral primary osteoarthritis, left knee: Secondary | ICD-10-CM | POA: Diagnosis not present

## 2022-09-16 DIAGNOSIS — G8918 Other acute postprocedural pain: Secondary | ICD-10-CM | POA: Diagnosis not present

## 2022-09-24 DIAGNOSIS — M25562 Pain in left knee: Secondary | ICD-10-CM | POA: Diagnosis not present

## 2022-09-24 DIAGNOSIS — M25462 Effusion, left knee: Secondary | ICD-10-CM | POA: Diagnosis not present

## 2022-09-24 DIAGNOSIS — Z96652 Presence of left artificial knee joint: Secondary | ICD-10-CM | POA: Diagnosis not present

## 2022-09-24 DIAGNOSIS — Z7409 Other reduced mobility: Secondary | ICD-10-CM | POA: Diagnosis not present

## 2022-09-24 DIAGNOSIS — R29898 Other symptoms and signs involving the musculoskeletal system: Secondary | ICD-10-CM | POA: Diagnosis not present

## 2022-09-24 DIAGNOSIS — M25662 Stiffness of left knee, not elsewhere classified: Secondary | ICD-10-CM | POA: Diagnosis not present

## 2022-09-29 DIAGNOSIS — M25562 Pain in left knee: Secondary | ICD-10-CM | POA: Diagnosis not present

## 2022-09-29 DIAGNOSIS — M25662 Stiffness of left knee, not elsewhere classified: Secondary | ICD-10-CM | POA: Diagnosis not present

## 2022-09-29 DIAGNOSIS — Z96652 Presence of left artificial knee joint: Secondary | ICD-10-CM | POA: Diagnosis not present

## 2022-09-29 DIAGNOSIS — R29898 Other symptoms and signs involving the musculoskeletal system: Secondary | ICD-10-CM | POA: Diagnosis not present

## 2022-09-29 DIAGNOSIS — M25462 Effusion, left knee: Secondary | ICD-10-CM | POA: Diagnosis not present

## 2022-09-29 DIAGNOSIS — Z7409 Other reduced mobility: Secondary | ICD-10-CM | POA: Diagnosis not present

## 2022-10-01 DIAGNOSIS — M25662 Stiffness of left knee, not elsewhere classified: Secondary | ICD-10-CM | POA: Diagnosis not present

## 2022-10-01 DIAGNOSIS — M25562 Pain in left knee: Secondary | ICD-10-CM | POA: Diagnosis not present

## 2022-10-01 DIAGNOSIS — Z96652 Presence of left artificial knee joint: Secondary | ICD-10-CM | POA: Diagnosis not present

## 2022-10-01 DIAGNOSIS — Z7409 Other reduced mobility: Secondary | ICD-10-CM | POA: Diagnosis not present

## 2022-10-01 DIAGNOSIS — R29898 Other symptoms and signs involving the musculoskeletal system: Secondary | ICD-10-CM | POA: Diagnosis not present

## 2022-10-01 DIAGNOSIS — M25462 Effusion, left knee: Secondary | ICD-10-CM | POA: Diagnosis not present

## 2022-10-08 DIAGNOSIS — R29898 Other symptoms and signs involving the musculoskeletal system: Secondary | ICD-10-CM | POA: Diagnosis not present

## 2022-10-08 DIAGNOSIS — Z7409 Other reduced mobility: Secondary | ICD-10-CM | POA: Diagnosis not present

## 2022-10-08 DIAGNOSIS — Z96652 Presence of left artificial knee joint: Secondary | ICD-10-CM | POA: Diagnosis not present

## 2022-10-08 DIAGNOSIS — M25662 Stiffness of left knee, not elsewhere classified: Secondary | ICD-10-CM | POA: Diagnosis not present

## 2022-10-08 DIAGNOSIS — M25462 Effusion, left knee: Secondary | ICD-10-CM | POA: Diagnosis not present

## 2022-10-08 DIAGNOSIS — M25562 Pain in left knee: Secondary | ICD-10-CM | POA: Diagnosis not present

## 2022-10-12 DIAGNOSIS — Z7409 Other reduced mobility: Secondary | ICD-10-CM | POA: Diagnosis not present

## 2022-10-12 DIAGNOSIS — M25462 Effusion, left knee: Secondary | ICD-10-CM | POA: Diagnosis not present

## 2022-10-12 DIAGNOSIS — M25562 Pain in left knee: Secondary | ICD-10-CM | POA: Diagnosis not present

## 2022-10-12 DIAGNOSIS — R29898 Other symptoms and signs involving the musculoskeletal system: Secondary | ICD-10-CM | POA: Diagnosis not present

## 2022-10-12 DIAGNOSIS — M25662 Stiffness of left knee, not elsewhere classified: Secondary | ICD-10-CM | POA: Diagnosis not present

## 2022-10-12 DIAGNOSIS — Z96652 Presence of left artificial knee joint: Secondary | ICD-10-CM | POA: Diagnosis not present

## 2022-10-19 ENCOUNTER — Ambulatory Visit: Payer: Medicare Other | Attending: Orthopedic Surgery | Admitting: Rehabilitative and Restorative Service Providers"

## 2022-10-19 ENCOUNTER — Other Ambulatory Visit: Payer: Self-pay

## 2022-10-19 ENCOUNTER — Encounter: Payer: Self-pay | Admitting: Rehabilitative and Restorative Service Providers"

## 2022-10-19 DIAGNOSIS — M25562 Pain in left knee: Secondary | ICD-10-CM | POA: Diagnosis not present

## 2022-10-19 DIAGNOSIS — R600 Localized edema: Secondary | ICD-10-CM | POA: Insufficient documentation

## 2022-10-19 DIAGNOSIS — M6281 Muscle weakness (generalized): Secondary | ICD-10-CM | POA: Insufficient documentation

## 2022-10-19 DIAGNOSIS — M25662 Stiffness of left knee, not elsewhere classified: Secondary | ICD-10-CM | POA: Insufficient documentation

## 2022-10-19 DIAGNOSIS — R262 Difficulty in walking, not elsewhere classified: Secondary | ICD-10-CM | POA: Insufficient documentation

## 2022-10-19 NOTE — Therapy (Signed)
OUTPATIENT PHYSICAL THERAPY LOWER EXTREMITY EVALUATION   Patient Name: Kathleen Peters MRN: [id number] DOB:[birth year], 72 y.o., female Today's Date: 10/19/2022  END OF SESSION:  PT End of Session - 10/19/22 1240     Visit Number 1    Date for PT Re-Evaluation 12/11/22    Authorization Type [employer]    Progress Note Due on Visit 10    PT Start Time 1230    PT Stop Time 1310    PT Time Calculation (min) 40 min    Activity Tolerance Patient tolerated treatment well    Behavior During Therapy WFL for tasks assessed/performed             Past Medical History:  Diagnosis Date   Arthritis    Back pain    Cancer ([place])    hx of skin cancer   Graves disease    Hyperthyroidism    Insomnia    Migraines    Past Surgical History:  Procedure Laterality Date   ABDOMINAL HYSTERECTOMY     ELBOW FRACTURE SURGERY     LAPAROSCOPIC APPENDECTOMY N/A 01/29/2021   Procedure: APPENDECTOMY LAPAROSCOPIC;  Surgeon: Clovis Riley, MD;  Location: [facility];  Service: General;  Laterality: N/A;   TONSILLECTOMY     TOTAL KNEE ARTHROPLASTY Right 05/30/2021   Procedure: RIGHT TOTAL KNEE ARTHROPLASTY;  Surgeon: Mcarthur Rossetti, MD;  Location: [facility];  Service: Orthopedics;  Laterality: Right;   TYMPANOSTOMY TUBE PLACEMENT     Patient Active Problem List   Diagnosis Date Noted   Status post total right knee replacement 05/30/2021   Unilateral primary osteoarthritis, right knee 05/29/2021   Unilateral primary osteoarthritis, left knee 04/01/2021   Acute appendicitis 01/28/2021   Hyperthyroidism 04/14/2020   Heterozygous factor V Leiden mutation ([place]) 09/23/2015   Lupus anticoagulant positive 09/23/2015   Varicose veins of both lower extremities 09/23/2015   Thrombophlebitis of superficial veins of right lower extremity 04/24/2014    PCP: Antony Contras, MD  REFERRING PROVIDER: Vickey Huger, MD  REFERRING DIAG: s/p left tka 1/24  THERAPY DIAG:  Left knee pain, unspecified  chronicity - Plan: PT plan of care cert/re-cert  Difficulty in walking, not elsewhere classified - Plan: PT plan of care cert/re-cert  Muscle weakness (generalized) - Plan: PT plan of care cert/re-cert  Stiffness of left knee, not elsewhere classified - Plan: PT plan of care cert/re-cert  Localized edema - Plan: PT plan of care cert/re-cert  Rationale for Evaluation and Treatment: Rehabilitation  ONSET DATE: 09/16/2022  SUBJECTIVE:   SUBJECTIVE STATEMENT: Pt underwent L TKA on 09/16/2022 by Dr Ronnie Derby.  Patient Has been working with another PT clinic since 09/24/2022, but has opted to switch to another PT clinic and is receiving new PT evaluation on this date. Patient has been having some concerns with pain and puckering of her L knee incision.  PERTINENT HISTORY: R TKA October 2022, Graves Disease, Hypothyroidism  PAIN:  Are you having pain? Yes: NPRS scale: 2/10 Pain location: left knee Pain description: sharp, incision still very sore Aggravating factors: knee flexion Relieving factors: rest and cold pack  PRECAUTIONS: None  WEIGHT BEARING RESTRICTIONS: No  FALLS:  Has patient fallen in last 6 months? No  LIVING ENVIRONMENT: Lives with: lives alone Lives in: House/apartment Stairs: Yes: External: 3 steps; on left going up Has following equipment at home: Single point cane, Walker - 2 wheeled, and Grab bars  OCCUPATION: Retired  PLOF: Independent and Leisure: walking, golfing  PATIENT GOALS: To be  able to walk properly without pain.  NEXT MD VISIT: 10/22/2022 with Dr Ronnie Derby  OBJECTIVE:   DIAGNOSTIC FINDINGS: Imaging following L TKA is unremarkable  PATIENT SURVEYS:  FOTO 42%  (projected 69% by visit 18)  COGNITION: Overall cognitive status: Within functional limits for tasks assessed     SENSATION: Pt reports paresthesias to left knee incision  EDEMA:  Circumferential: left knee 39 cm, right knee 37 cm  MUSCLE LENGTH: Hamstrings: tightness  noted  POSTURE: No Significant postural limitations  PALPATION: Pt with noted "puckering" of left knee incision. Pt with tenderness to palpation of areas of raised skin.  Pt reports that she has been doing some scar massage with Vitamin E.  LOWER EXTREMITY ROM:  10/19/2022: Left knee 7-100 A/ROM in sitting  LOWER EXTREMITY MMT:  10/19/2022: Left hip strength of 4/5, Left quad strength of 5-/5, left hamstring of 4/5  FUNCTIONAL TESTS:  Eval:   5 times sit to stand: 13.2 sec  GAIT: Distance walked: >100 ft Assistive device utilized: None Level of assistance: Complete Independence Comments: Antalgic gait with decreased stance time on LLE and decreased left knee flexion.   TODAY'S TREATMENT:                                                                                                                              DATE: 10/19/2022  Manual Therapy to left knee for scar tissue mobilization and soft tissue mobilization Nustep level 3 x4 min with PT present to discuss status Issued HEP and reviewed all exercises Game Ready to left knee:  3 snowflakes, medium compression, x10 min   PATIENT EDUCATION:  Education details: Issued HEP Person educated: Patient Education method: Explanation, Demonstration, and Handouts Education comprehension: verbalized understanding and returned demonstration  HOME EXERCISE PROGRAM: Access Code: [email or web address] URL: [email or web address] Date: 10/19/2022 Prepared by: Juel Burrow  Exercises - Supine Ankle Pumps  - 1 x daily - 7 x weekly - 2 sets - 10 reps - Supine Quadricep Sets  - 1 x daily - 7 x weekly - 2 sets - 10 reps - Active Straight Leg Raise with Quad Set  - 1 x daily - 7 x weekly - 2 sets - 10 reps - Supine Heel Slide  - 1 x daily - 7 x weekly - 2 sets - 10 reps - Seated Long Arc Quad  - 1 x daily - 7 x weekly - 2 sets - 10 reps - Seated Heel Slide  - 1 x daily - 7 x weekly - 2 sets - 10 reps - Standing Marching  - 1 x daily -  7 x weekly - 2 sets - 10 reps  ASSESSMENT:  CLINICAL IMPRESSION: Patient is a 72 y.o. female who was seen today for physical therapy evaluation and treatment for s/p L TKA on 09/16/2022.  Patient's PLOF is independent and able to ambulate without difficulty.  Pt with history of R TKA in October of 2022 and states  that she has been having some pain on the lateral aspect of her right knee.  Patient presents with decreased left knee A/ROM, decreased LLE strength, difficulty walking, left knee edema, and difficulty performing functional tasks without increased pain.  Patient would benefit from skilled PT to allow pt to return to pain-free mobility and ambulation.   OBJECTIVE IMPAIRMENTS: difficulty walking, decreased ROM, decreased strength, and pain.   ACTIVITY LIMITATIONS: bending, standing, squatting, and stairs  PARTICIPATION LIMITATIONS: cleaning and community activity  PERSONAL FACTORS: Past/current experiences and 1-2 comorbidities: s/p R TKA in 2022, s/p L TKA on 09/16/2022  are also affecting patient's functional outcome.   REHAB POTENTIAL: Good  CLINICAL DECISION MAKING: Stable/uncomplicated  EVALUATION COMPLEXITY: Low   GOALS: Goals reviewed with patient? Yes  SHORT TERM GOALS: Target date: 11/06/2022 Patient will be independent with initial HEP. Baseline: Goal status: INITIAL  2.  Pt will increase left knee A/ROM to 0 to 105 degrees to allow for pt to complete a full revolution on the recumbent bike. Baseline: 7-100 degrees Goal status: INITIAL    LONG TERM GOALS: Target date: 12/11/2022  Pt will be independent with advanced HEP and self progression of HEP following discharge. Baseline:  Goal status: INITIAL  2.  Patient will increase left knee A/ROM to 0 to 120 degrees to allow for easier navigation of stairs with reciprocal pattern. Baseline: 7-100 Goal status: INITIAL  3.  Patient will increase generalized left leg strength to at least 4+ to 5-/5 to allow her to  perform daily activities with increased ease. Baseline:  Goal status: INITIAL  4.  Patient will increase FOTO to at least 69% to demonstrate improvements in functional mobility. Baseline: 42% Goal status: INITIAL  5.  Patient will be able report to ambulate for at least 30 minutes outside without any increase in pain or loss of balance. Baseline:  Goal status: INITIAL    PLAN:  PT FREQUENCY: 2x/week  PT DURATION: 8 weeks  PLANNED INTERVENTIONS: Therapeutic exercises, Therapeutic activity, Neuromuscular re-education, Balance training, Gait training, Patient/Family education, Self Care, Joint mobilization, Joint manipulation, Stair training, Aquatic Therapy, Dry Needling, Cryotherapy, Moist heat, scar mobilization, Taping, Vasopneumatic device, Manual therapy, and Re-evaluation  PLAN FOR NEXT SESSION: progress and assess HEP as indicated, ROM, strengthening   Edyn Qazi, PT 10/19/2022, 1:38 PM   [facility] [address], [place], [place] [address] Phone # [phone] Fax [phone]

## 2022-10-20 NOTE — Therapy (Signed)
OUTPATIENT PHYSICAL THERAPY TREATMENT NOTE   Patient Name: Kathleen Peters MRN: [id number] DOB:October 10, 1950, 72 y.o., female Today's Date: 10/21/2022  PCP: Antony Contras, MD  REFERRING PROVIDER: Vickey Huger, MD  END OF SESSION:   PT End of Session - 10/21/22 1133     Visit Number 2    Date for PT Re-Evaluation 12/11/22    Authorization Type [employer] Medicare    Progress Note Due on Visit 10    PT Start Time 1100    PT Stop Time 1150    PT Time Calculation (min) 50 min    Activity Tolerance Patient tolerated treatment well    Behavior During Therapy WFL for tasks assessed/performed                 Past Medical History:  Diagnosis Date   Arthritis    Back pain    Cancer ([place])    hx of skin cancer   Graves disease    Hyperthyroidism    Insomnia    Migraines    Past Surgical History:  Procedure Laterality Date   ABDOMINAL HYSTERECTOMY     ELBOW FRACTURE SURGERY     LAPAROSCOPIC APPENDECTOMY N/A 01/29/2021   Procedure: APPENDECTOMY LAPAROSCOPIC;  Surgeon: Clovis Riley, MD;  Location: [facility];  Service: General;  Laterality: N/A;   TONSILLECTOMY     TOTAL KNEE ARTHROPLASTY Right 05/30/2021   Procedure: RIGHT TOTAL KNEE ARTHROPLASTY;  Surgeon: Mcarthur Rossetti, MD;  Location: [facility];  Service: Orthopedics;  Laterality: Right;   TYMPANOSTOMY TUBE PLACEMENT     Patient Active Problem List   Diagnosis Date Noted   Status post total right knee replacement 05/30/2021   Unilateral primary osteoarthritis, right knee 05/29/2021   Unilateral primary osteoarthritis, left knee 04/01/2021   Acute appendicitis 01/28/2021   Hyperthyroidism 04/14/2020   Heterozygous factor V Leiden mutation ([place]) 09/23/2015   Lupus anticoagulant positive 09/23/2015   Varicose veins of both lower extremities 09/23/2015   Thrombophlebitis of superficial veins of right lower extremity 04/24/2014    PCP: Antony Contras, MD  REFERRING PROVIDER: Vickey Huger, MD  REFERRING DIAG: s/p left  tka 1/24  THERAPY DIAG:  Left knee pain, unspecified chronicity  Difficulty in walking, not elsewhere classified  Muscle weakness (generalized)  Stiffness of left knee, not elsewhere classified  Rationale for Evaluation and Treatment: Rehabilitation  ONSET DATE: 09/16/2022  SUBJECTIVE:   SUBJECTIVE STATEMENT: Pt states that things are going well, but she had some soreness last night with shooting pain in the front of the knee. Pt is worried about the puckering of her scar.  PERTINENT HISTORY: R TKA October 2022, Graves Disease, Hypothyroidism  PAIN:  Are you having pain? Yes: NPRS scale: 4/10 Pain location: left knee Pain description: sharp, incision still very sore Aggravating factors: knee flexion Relieving factors: rest and cold pack  PRECAUTIONS: None  WEIGHT BEARING RESTRICTIONS: No  FALLS:  Has patient fallen in last 6 months? No  LIVING ENVIRONMENT: Lives with: lives alone Lives in: House/apartment Stairs: Yes: External: 3 steps; on left going up Has following equipment at home: Single point cane, Walker - 2 wheeled, and Grab bars  OCCUPATION: Retired  PLOF: Independent and Leisure: walking, golfing  PATIENT GOALS: To be able to walk properly without pain.  NEXT MD VISIT: 10/22/2022 with Dr Ronnie Derby  OBJECTIVE:   DIAGNOSTIC FINDINGS: Imaging following L TKA is unremarkable  PATIENT SURVEYS:  FOTO 42%  (projected 69% by visit 18)  COGNITION: Overall cognitive status: Within  functional limits for tasks assessed     SENSATION: Pt reports paresthesias to left knee incision  EDEMA:  Circumferential: left knee 39 cm, right knee 37 cm  MUSCLE LENGTH: Hamstrings: tightness noted  POSTURE: No Significant postural limitations  PALPATION: Pt with noted "puckering" of left knee incision. Pt with tenderness to palpation of areas of raised skin.  Pt reports that she has been doing some scar massage with Vitamin E.  LOWER EXTREMITY  ROM:  10/19/2022: Left knee 7-100 A/ROM in sitting 10/17/22 Lt knee flexion 110 deg  LOWER EXTREMITY MMT:  10/19/2022: Left hip strength of 4/5, Left quad strength of 5-/5, left hamstring of 4/5  FUNCTIONAL TESTS:  Eval:   5 times sit to stand: 13.2 sec  GAIT: Distance walked: >100 ft Assistive device utilized: None Level of assistance: Complete Independence Comments: Antalgic gait with decreased stance time on LLE and decreased left knee flexion.   TODAY'S TREATMENT:                                                                                                                              DATE:  10/21/22 Nustep L5 x5 min, PT present to discuss status  Manual: extensive scar massage, patellar mobilization Seated Lt knee flexion stretch with education on self scar massage Standing staggered heel raises x20 reps Rt step over hurdle with 1 HHA, x15 reps  Game ready: 3 snowflakes, medium compression, 10 min    10/19/2022  Manual Therapy to left knee for scar tissue mobilization and soft tissue mobilization Nustep level 3 x4 min with PT present to discuss status Issued HEP and reviewed all exercises Game Ready to left knee:  3 snowflakes, medium compression, x10 min   PATIENT EDUCATION:  Education details: scar massage Person educated: Patient Education method: Explanation, Demonstration, and Handouts Education comprehension: verbalized understanding and returned demonstration  HOME EXERCISE PROGRAM: Access Code: [email or web address] URL: [email or web address] Date: 10/19/2022 Prepared by: Juel Burrow  Exercises - Supine Ankle Pumps  - 1 x daily - 7 x weekly - 2 sets - 10 reps - Supine Quadricep Sets  - 1 x daily - 7 x weekly - 2 sets - 10 reps - Active Straight Leg Raise with Quad Set  - 1 x daily - 7 x weekly - 2 sets - 10 reps - Supine Heel Slide  - 1 x daily - 7 x weekly - 2 sets - 10 reps - Seated Long Arc Quad  - 1 x daily - 7 x weekly - 2 sets - 10 reps -  Seated Heel Slide  - 1 x daily - 7 x weekly - 2 sets - 10 reps - Standing Marching  - 1 x daily - 7 x weekly - 2 sets - 10 reps  ASSESSMENT:  CLINICAL IMPRESSION: Patient is concerned with her scar puckering and wanted this to be a majority of the focus of today's session. PT was able to break up scar adhesions  and pt was educated on ways to do this at home. Session also address gait mechanics and LE strength. Pt had some soreness of anterior knee, as expected, following manual treatment, but knee flexion improved 10 deg from her last session. Ended with vasopneumatic device to decrease pain and swelling.   OBJECTIVE IMPAIRMENTS: difficulty walking, decreased ROM, decreased strength, and pain.   ACTIVITY LIMITATIONS: bending, standing, squatting, and stairs  PARTICIPATION LIMITATIONS: cleaning and community activity  PERSONAL FACTORS: Past/current experiences and 1-2 comorbidities: s/p R TKA in 2022, s/p L TKA on 09/16/2022  are also affecting patient's functional outcome.   REHAB POTENTIAL: Good  CLINICAL DECISION MAKING: Stable/uncomplicated  EVALUATION COMPLEXITY: Low   GOALS: Goals reviewed with patient? Yes  SHORT TERM GOALS: Target date: 11/06/2022 Patient will be independent with initial HEP. Baseline: Goal status: INITIAL  2.  Pt will increase left knee A/ROM to 0 to 105 degrees to allow for pt to complete a full revolution on the recumbent bike. Baseline: 7-100 degrees Goal status: INITIAL    LONG TERM GOALS: Target date: 12/11/2022  Pt will be independent with advanced HEP and self progression of HEP following discharge. Baseline:  Goal status: INITIAL  2.  Patient will increase left knee A/ROM to 0 to 120 degrees to allow for easier navigation of stairs with reciprocal pattern. Baseline: 7-100 Goal status: INITIAL  3.  Patient will increase generalized left leg strength to at least 4+ to 5-/5 to allow her to perform daily activities with increased  ease. Baseline:  Goal status: INITIAL  4.  Patient will increase FOTO to at least 69% to demonstrate improvements in functional mobility. Baseline: 42% Goal status: INITIAL  5.  Patient will be able report to ambulate for at least 30 minutes outside without any increase in pain or loss of balance. Baseline:  Goal status: INITIAL    PLAN:  PT FREQUENCY: 2x/week  PT DURATION: 8 weeks  PLANNED INTERVENTIONS: Therapeutic exercises, Therapeutic activity, Neuromuscular re-education, Balance training, Gait training, Patient/Family education, Self Care, Joint mobilization, Joint manipulation, Stair training, Aquatic Therapy, Dry Needling, Cryotherapy, Moist heat, scar mobilization, Taping, Vasopneumatic device, Manual therapy, and Re-evaluation  PLAN FOR NEXT SESSION: consider Ktape scar; progress and assess HEP as indicated, ROM, strengthening   8:31 PM,10/21/22 Sherol Dade PT, [facility] [place] [place]

## 2022-10-21 ENCOUNTER — Ambulatory Visit: Payer: Medicare Other | Admitting: Physical Therapy

## 2022-10-21 ENCOUNTER — Encounter: Payer: Self-pay | Admitting: Physical Therapy

## 2022-10-21 DIAGNOSIS — M25662 Stiffness of left knee, not elsewhere classified: Secondary | ICD-10-CM | POA: Diagnosis not present

## 2022-10-21 DIAGNOSIS — R262 Difficulty in walking, not elsewhere classified: Secondary | ICD-10-CM

## 2022-10-21 DIAGNOSIS — M25562 Pain in left knee: Secondary | ICD-10-CM | POA: Diagnosis not present

## 2022-10-21 DIAGNOSIS — R600 Localized edema: Secondary | ICD-10-CM | POA: Diagnosis not present

## 2022-10-21 DIAGNOSIS — M6281 Muscle weakness (generalized): Secondary | ICD-10-CM

## 2022-10-23 ENCOUNTER — Ambulatory Visit: Payer: Medicare Other | Admitting: Rehabilitative and Restorative Service Providers"

## 2022-10-26 ENCOUNTER — Ambulatory Visit: Payer: Medicare Other | Attending: Orthopedic Surgery | Admitting: Rehabilitative and Restorative Service Providers"

## 2022-10-26 ENCOUNTER — Encounter: Payer: Self-pay | Admitting: Rehabilitative and Restorative Service Providers"

## 2022-10-26 DIAGNOSIS — Z96651 Presence of right artificial knee joint: Secondary | ICD-10-CM | POA: Insufficient documentation

## 2022-10-26 DIAGNOSIS — R262 Difficulty in walking, not elsewhere classified: Secondary | ICD-10-CM

## 2022-10-26 DIAGNOSIS — R600 Localized edema: Secondary | ICD-10-CM

## 2022-10-26 DIAGNOSIS — M6281 Muscle weakness (generalized): Secondary | ICD-10-CM | POA: Diagnosis present

## 2022-10-26 DIAGNOSIS — M25562 Pain in left knee: Secondary | ICD-10-CM | POA: Diagnosis not present

## 2022-10-26 DIAGNOSIS — Z96652 Presence of left artificial knee joint: Secondary | ICD-10-CM | POA: Insufficient documentation

## 2022-10-26 DIAGNOSIS — M25662 Stiffness of left knee, not elsewhere classified: Secondary | ICD-10-CM

## 2022-10-26 NOTE — Therapy (Signed)
OUTPATIENT PHYSICAL THERAPY TREATMENT NOTE   Patient Name: Kathleen Peters MRN: [id number] DOB:13-Aug-1951, 72 y.o., female Today's Date: 10/26/2022  PCP: Antony Contras, MD  REFERRING PROVIDER: Vickey Huger, MD  END OF SESSION:   PT End of Session - 10/26/22 1234     Visit Number 3    Date for PT Re-Evaluation 12/11/22    Authorization Type [employer] Medicare    Progress Note Due on Visit 10    PT Start Time 1230    PT Stop Time 1320    PT Time Calculation (min) 50 min    Activity Tolerance Patient tolerated treatment well    Behavior During Therapy WFL for tasks assessed/performed                 Past Medical History:  Diagnosis Date   Arthritis    Back pain    Cancer ([place])    hx of skin cancer   Graves disease    Hyperthyroidism    Insomnia    Migraines    Past Surgical History:  Procedure Laterality Date   ABDOMINAL HYSTERECTOMY     ELBOW FRACTURE SURGERY     LAPAROSCOPIC APPENDECTOMY N/A 01/29/2021   Procedure: APPENDECTOMY LAPAROSCOPIC;  Surgeon: Clovis Riley, MD;  Location: [facility];  Service: General;  Laterality: N/A;   TONSILLECTOMY     TOTAL KNEE ARTHROPLASTY Right 05/30/2021   Procedure: RIGHT TOTAL KNEE ARTHROPLASTY;  Surgeon: Mcarthur Rossetti, MD;  Location: [facility];  Service: Orthopedics;  Laterality: Right;   TYMPANOSTOMY TUBE PLACEMENT     Patient Active Problem List   Diagnosis Date Noted   Status post total right knee replacement 05/30/2021   Unilateral primary osteoarthritis, right knee 05/29/2021   Unilateral primary osteoarthritis, left knee 04/01/2021   Acute appendicitis 01/28/2021   Hyperthyroidism 04/14/2020   Heterozygous factor V Leiden mutation ([place]) 09/23/2015   Lupus anticoagulant positive 09/23/2015   Varicose veins of both lower extremities 09/23/2015   Thrombophlebitis of superficial veins of right lower extremity 04/24/2014    PCP: Antony Contras, MD  REFERRING PROVIDER: Vickey Huger, MD  REFERRING DIAG: s/p left  tka 1/24  THERAPY DIAG:  Left knee pain, unspecified chronicity  Difficulty in walking, not elsewhere classified  Muscle weakness (generalized)  Stiffness of left knee, not elsewhere classified  Localized edema  Rationale for Evaluation and Treatment: Rehabilitation  ONSET DATE: 09/16/2022  SUBJECTIVE:   SUBJECTIVE STATEMENT: Pt reports that she went to see Dr Ronnie Derby for her follow up and he is concerned with her puckering incision and that he may need to go back in for surgical revision/correction.  Patient states that Dr Ronnie Derby does not recommend manual therapy for incision healing at this time.  PERTINENT HISTORY: R TKA October 2022, Graves Disease, Hypothyroidism  PAIN:  Are you having pain? Yes: NPRS scale: 4/10 Pain location: left knee Pain description: sharp, incision still very sore Aggravating factors: knee flexion Relieving factors: rest and cold pack  PRECAUTIONS: None  WEIGHT BEARING RESTRICTIONS: No  FALLS:  Has patient fallen in last 6 months? No  LIVING ENVIRONMENT: Lives with: lives alone Lives in: House/apartment Stairs: Yes: External: 3 steps; on left going up Has following equipment at home: Single point cane, Walker - 2 wheeled, and Grab bars  OCCUPATION: Retired  PLOF: Independent and Leisure: walking, golfing  PATIENT GOALS: To be able to walk properly without pain.  NEXT MD VISIT: 10/22/2022 with Dr Ronnie Derby  OBJECTIVE:   DIAGNOSTIC FINDINGS: Imaging following L  TKA is unremarkable  PATIENT SURVEYS:  FOTO 42%  (projected 69% by visit 18)  COGNITION: Overall cognitive status: Within functional limits for tasks assessed     SENSATION: Pt reports paresthesias to left knee incision  EDEMA:  Circumferential: left knee 39 cm, right knee 37 cm  MUSCLE LENGTH: Hamstrings: tightness noted  POSTURE: No Significant postural limitations  PALPATION: Pt with noted "puckering" of left knee incision. Pt with tenderness to palpation of areas  of raised skin.  Pt reports that she has been doing some scar massage with Vitamin E.  LOWER EXTREMITY ROM:  10/19/2022: Left knee 7-100 A/ROM in sitting 10/17/22 Lt knee flexion 110 deg  LOWER EXTREMITY MMT:  10/19/2022: Left hip strength of 4/5, Left quad strength of 5-/5, left hamstring of 4/5  FUNCTIONAL TESTS:  Eval:   5 times sit to stand: 13.2 sec  GAIT: Distance walked: >100 ft Assistive device utilized: None Level of assistance: Complete Independence Comments: Antalgic gait with decreased stance time on LLE and decreased left knee flexion.   TODAY'S TREATMENT:                                                                                                                               DATE: 10/26/2022 Recumbent bike level 1 x6 min with PT present to discuss status Seated with 2#:  heel/toe raises, marching, LAQ.  BLE 2x10 each Sit to/from stand holding 5# kettlebell x10 Supine with 2#:  straight leg raise with quad set 2x10 bilat Supine bridge 2x10 Prone hamstring curl with 2# 2x10 bilat (towel roll under right knee) Leg Press 70# 2x10 Calf stretch on slanted rocker board 2x20 sec bilat High march on trampoline with UE support x1 min Step ups on 6" step with UE support x10 bilat Game ready: 3 snowflakes, medium compression, 10 min   DATE: 10/21/22 Nustep L5 x5 min, PT present to discuss status  Manual: extensive scar massage, patellar mobilization Seated Lt knee flexion stretch with education on self scar massage Standing staggered heel raises x20 reps Rt step over hurdle with 1 HHA, x15 reps  Game ready: 3 snowflakes, medium compression, 10 min    DATE:  10/19/2022  Manual Therapy to left knee for scar tissue mobilization and soft tissue mobilization Nustep level 3 x4 min with PT present to discuss status Issued HEP and reviewed all exercises Game Ready to left knee:  3 snowflakes, medium compression, x10 min   PATIENT EDUCATION:  Education details: scar  massage Person educated: Patient Education method: Explanation, Demonstration, and Handouts Education comprehension: verbalized understanding and returned demonstration  HOME EXERCISE PROGRAM: Access Code: [email or web address] URL: [email or web address] Date: 10/19/2022 Prepared by: Juel Burrow  Exercises - Supine Ankle Pumps  - 1 x daily - 7 x weekly - 2 sets - 10 reps - Supine Quadricep Sets  - 1 x daily - 7 x weekly - 2 sets - 10 reps - Active Straight Leg Raise with [employer]  Set  - 1 x daily - 7 x weekly - 2 sets - 10 reps - Supine Heel Slide  - 1 x daily - 7 x weekly - 2 sets - 10 reps - Seated Long Arc Quad  - 1 x daily - 7 x weekly - 2 sets - 10 reps - Seated Heel Slide  - 1 x daily - 7 x weekly - 2 sets - 10 reps - Standing Marching  - 1 x daily - 7 x weekly - 2 sets - 10 reps  ASSESSMENT:  CLINICAL IMPRESSION: Ms Brumleve presents to skilled PT reporting that Dr Ronnie Derby does not PT to perform manual therapy at this time.  Pt able to progress to complete a full revolution on the recumbent bike, but at a slow pace.  Patient reports that she is continuing to have incisional soreness and will see Dr Ronnie Derby again in 2.5 weeks to assess next steps for incision.  Patient continues to have a relief of pain with Game Ready at completion of visit.   OBJECTIVE IMPAIRMENTS: difficulty walking, decreased ROM, decreased strength, and pain.   ACTIVITY LIMITATIONS: bending, standing, squatting, and stairs  PARTICIPATION LIMITATIONS: cleaning and community activity  PERSONAL FACTORS: Past/current experiences and 1-2 comorbidities: s/p R TKA in 2022, s/p L TKA on 09/16/2022  are also affecting patient's functional outcome.   REHAB POTENTIAL: Good  CLINICAL DECISION MAKING: Stable/uncomplicated  EVALUATION COMPLEXITY: Low   GOALS: Goals reviewed with patient? Yes  SHORT TERM GOALS: Target date: 11/06/2022 Patient will be independent with initial HEP. Baseline: Goal status: MET  2.  Pt  will increase left knee A/ROM to 0 to 105 degrees to allow for pt to complete a full revolution on the recumbent bike. Baseline: 7-100 degrees Goal status: MET on 10/26/2022    LONG TERM GOALS: Target date: 12/11/2022  Pt will be independent with advanced HEP and self progression of HEP following discharge. Baseline:  Goal status: IN PROGRESS  2.  Patient will increase left knee A/ROM to 0 to 120 degrees to allow for easier navigation of stairs with reciprocal pattern. Baseline: 7-100 Goal status: INITIAL  3.  Patient will increase generalized left leg strength to at least 4+ to 5-/5 to allow her to perform daily activities with increased ease. Baseline:  Goal status: INITIAL  4.  Patient will increase FOTO to at least 69% to demonstrate improvements in functional mobility. Baseline: 42% Goal status: INITIAL  5.  Patient will be able report to ambulate for at least 30 minutes outside without any increase in pain or loss of balance. Baseline:  Goal status: INITIAL    PLAN:  PT FREQUENCY: 2x/week  PT DURATION: 8 weeks  PLANNED INTERVENTIONS: Therapeutic exercises, Therapeutic activity, Neuromuscular re-education, Balance training, Gait training, Patient/Family education, Self Care, Joint mobilization, Joint manipulation, Stair training, Aquatic Therapy, Dry Needling, Cryotherapy, Moist heat, scar mobilization, Taping, Vasopneumatic device, Manual therapy, and Re-evaluation  PLAN FOR NEXT SESSION: consider Ktape scar; progress and assess HEP as indicated, ROM, strengthening   Juel Burrow, PT 10/26/22 1:29 PM  [facility] [address] [place], [place] [address] Phone # [phone] Fax [phone]

## 2022-10-30 ENCOUNTER — Encounter: Payer: Self-pay | Admitting: Rehabilitative and Restorative Service Providers"

## 2022-10-30 ENCOUNTER — Ambulatory Visit: Payer: Medicare Other | Admitting: Rehabilitative and Restorative Service Providers"

## 2022-10-30 DIAGNOSIS — Z96651 Presence of right artificial knee joint: Secondary | ICD-10-CM | POA: Diagnosis not present

## 2022-10-30 DIAGNOSIS — Z96652 Presence of left artificial knee joint: Secondary | ICD-10-CM | POA: Diagnosis not present

## 2022-10-30 DIAGNOSIS — M6281 Muscle weakness (generalized): Secondary | ICD-10-CM

## 2022-10-30 DIAGNOSIS — M25562 Pain in left knee: Secondary | ICD-10-CM | POA: Diagnosis not present

## 2022-10-30 DIAGNOSIS — M25662 Stiffness of left knee, not elsewhere classified: Secondary | ICD-10-CM

## 2022-10-30 DIAGNOSIS — R262 Difficulty in walking, not elsewhere classified: Secondary | ICD-10-CM

## 2022-10-30 DIAGNOSIS — R600 Localized edema: Secondary | ICD-10-CM

## 2022-10-30 NOTE — Therapy (Signed)
OUTPATIENT PHYSICAL THERAPY TREATMENT NOTE   Patient Name: Kathleen Peters MRN: [id number] DOB:30-Oct-1950, 72 y.o., female Today's Date: 10/30/2022  PCP: Antony Contras, MD  REFERRING PROVIDER: Vickey Huger, MD  END OF SESSION:   PT End of Session - 10/30/22 1059     Visit Number 4    Date for PT Re-Evaluation 12/11/22    Authorization Type [employer] Medicare    Progress Note Due on Visit 10    PT Start Time 1051    PT Stop Time 1140    PT Time Calculation (min) 49 min    Activity Tolerance Patient tolerated treatment well    Behavior During Therapy WFL for tasks assessed/performed                 Past Medical History:  Diagnosis Date   Arthritis    Back pain    Cancer ([place])    hx of skin cancer   Graves disease    Hyperthyroidism    Insomnia    Migraines    Past Surgical History:  Procedure Laterality Date   ABDOMINAL HYSTERECTOMY     ELBOW FRACTURE SURGERY     LAPAROSCOPIC APPENDECTOMY N/A 01/29/2021   Procedure: APPENDECTOMY LAPAROSCOPIC;  Surgeon: Clovis Riley, MD;  Location: [facility];  Service: General;  Laterality: N/A;   TONSILLECTOMY     TOTAL KNEE ARTHROPLASTY Right 05/30/2021   Procedure: RIGHT TOTAL KNEE ARTHROPLASTY;  Surgeon: Mcarthur Rossetti, MD;  Location: [facility];  Service: Orthopedics;  Laterality: Right;   TYMPANOSTOMY TUBE PLACEMENT     Patient Active Problem List   Diagnosis Date Noted   Status post total right knee replacement 05/30/2021   Unilateral primary osteoarthritis, right knee 05/29/2021   Unilateral primary osteoarthritis, left knee 04/01/2021   Acute appendicitis 01/28/2021   Hyperthyroidism 04/14/2020   Heterozygous factor V Leiden mutation ([place]) 09/23/2015   Lupus anticoagulant positive 09/23/2015   Varicose veins of both lower extremities 09/23/2015   Thrombophlebitis of superficial veins of right lower extremity 04/24/2014    PCP: Antony Contras, MD  REFERRING PROVIDER: Vickey Huger, MD  REFERRING DIAG: s/p left  tka 1/24  THERAPY DIAG:  Left knee pain, unspecified chronicity  Difficulty in walking, not elsewhere classified  Muscle weakness (generalized)  Stiffness of left knee, not elsewhere classified  Localized edema  Rationale for Evaluation and Treatment: Rehabilitation  ONSET DATE: 09/16/2022  SUBJECTIVE:   SUBJECTIVE STATEMENT: Pt reports some stiffness this morning  PERTINENT HISTORY: R TKA October 2022, Graves Disease, Hypothyroidism  PAIN:  Are you having pain? Yes: NPRS scale: 2/10 Pain location: left knee Pain description: sharp, incision still very sore Aggravating factors: knee flexion Relieving factors: rest and cold pack  PRECAUTIONS: None  WEIGHT BEARING RESTRICTIONS: No  FALLS:  Has patient fallen in last 6 months? No  LIVING ENVIRONMENT: Lives with: lives alone Lives in: House/apartment Stairs: Yes: External: 3 steps; on left going up Has following equipment at home: Single point cane, Walker - 2 wheeled, and Grab bars  OCCUPATION: Retired  PLOF: Independent and Leisure: walking, golfing  PATIENT GOALS: To be able to walk properly without pain.  NEXT MD VISIT: 10/22/2022 with Dr Ronnie Derby  OBJECTIVE:   DIAGNOSTIC FINDINGS: Imaging following L TKA is unremarkable  PATIENT SURVEYS:  FOTO 42%  (projected 69% by visit 18)  COGNITION: Overall cognitive status: Within functional limits for tasks assessed     SENSATION: Pt reports paresthesias to left knee incision  EDEMA:  Circumferential: left knee  39 cm, right knee 37 cm  MUSCLE LENGTH: Hamstrings: tightness noted  POSTURE: No Significant postural limitations  PALPATION: Pt with noted "puckering" of left knee incision. Pt with tenderness to palpation of areas of raised skin.  Pt reports that she has been doing some scar massage with Vitamin E.  LOWER EXTREMITY ROM:  10/19/2022: Left knee 7-100 A/ROM in sitting  10/17/22 Lt knee flexion 110 deg  10/30/2022 Left knee A/ROM in supine  0-122 degrees  LOWER EXTREMITY MMT:  10/19/2022: Left hip strength of 4/5, Left quad strength of 5-/5, left hamstring of 4/5  FUNCTIONAL TESTS:  Eval:   5 times sit to stand: 13.2 sec  GAIT: Distance walked: >100 ft Assistive device utilized: None Level of assistance: Complete Independence Comments: Antalgic gait with decreased stance time on LLE and decreased left knee flexion.   TODAY'S TREATMENT:                                                                                                                               DATE: 10/30/2022 Recumbent bike level 1 x6 min with PT present to discuss status Seated heel slides with foot on slider x15 bilat Seated with 2#:  heel/toe raises, marching, LAQ.  BLE 2x10 each Sit to/from stand holding 5# kettlebell x10 Supine with 2#:  straight leg raise with quad set 2x10 bilat Supine bridge 2x10 Prone hamstring curl with 2# 2x10 bilat (towel roll under right knee) Leg Press (seat at 7) 70# 2x10 Calf stretch on slanted rocker board 2x20 sec bilat Side and back lunges with unilateral foot on slider x10 reps bilat Step up and down staircase x2 laps with reciprocal pattern and heavy reliance on BUE Game ready: 3 snowflakes, medium compression, 10 min   DATE: 10/26/2022 Recumbent bike level 1 x6 min with PT present to discuss status Seated with 2#:  heel/toe raises, marching, LAQ.  BLE 2x10 each Sit to/from stand holding 5# kettlebell x10 Supine with 2#:  straight leg raise with quad set 2x10 bilat Supine bridge 2x10 Prone hamstring curl with 2# 2x10 bilat (towel roll under right knee) Leg Press 70# 2x10 Calf stretch on slanted rocker board 2x20 sec bilat High march on trampoline with UE support x1 min Step ups on 6" step with UE support x10 bilat Game ready: 3 snowflakes, medium compression, 10 min   DATE: 10/21/22 Nustep L5 x5 min, PT present to discuss status  Manual: extensive scar massage, patellar mobilization Seated Lt knee  flexion stretch with education on self scar massage Standing staggered heel raises x20 reps Rt step over hurdle with 1 HHA, x15 reps  Game ready: 3 snowflakes, medium compression, 10 min    PATIENT EDUCATION:  Education details: scar massage Person educated: Patient Education method: Explanation, Demonstration, and Handouts Education comprehension: verbalized understanding and returned demonstration  HOME EXERCISE PROGRAM: Access Code: [email or web address] URL: [email or web address] Date: 10/30/2022 Prepared by: Juel Burrow  Exercises - Supine Ankle Pumps  -  1 x daily - 7 x weekly - 2 sets - 10 reps - Supine Quadricep Sets  - 1 x daily - 7 x weekly - 2 sets - 10 reps - Active Straight Leg Raise with Quad Set  - 1 x daily - 7 x weekly - 2 sets - 10 reps - Supine Heel Slide  - 1 x daily - 7 x weekly - 2 sets - 10 reps - Seated Long Arc Quad  - 1 x daily - 7 x weekly - 2 sets - 10 reps - Seated Heel Slide  - 1 x daily - 7 x weekly - 2 sets - 10 reps - Standing Marching  - 1 x daily - 7 x weekly - 2 sets - 10 reps - Standing Hip Abduction with Counter Support  - 1 x daily - 7 x weekly - 2 sets - 10 reps - Standing Hip Extension with Counter Support  - 1 x daily - 7 x weekly - 2 sets - 10 reps - Standing Knee Flexion AROM with Chair Support  - 1 x daily - 7 x weekly - 2 sets - 10 reps - Sit to Stand with Arms Crossed  - 1 x daily - 7 x weekly - 2 sets - 10 reps  ASSESSMENT:  CLINICAL IMPRESSION: Ms Deschamp presents to skilled PT with continued progress towards goals.  Patient requires UE support with lunging exercise.  Patient has increased her knee A/ROM to 0-122 degrees.  Patient continues to progress towards goal related activities and increased strength and ability to navigate stairs with reciprocal pattern.   OBJECTIVE IMPAIRMENTS: difficulty walking, decreased ROM, decreased strength, and pain.   ACTIVITY LIMITATIONS: bending, standing, squatting, and  stairs  PARTICIPATION LIMITATIONS: cleaning and community activity  PERSONAL FACTORS: Past/current experiences and 1-2 comorbidities: s/p R TKA in 2022, s/p L TKA on 09/16/2022  are also affecting patient's functional outcome.   REHAB POTENTIAL: Good  CLINICAL DECISION MAKING: Stable/uncomplicated  EVALUATION COMPLEXITY: Low   GOALS: Goals reviewed with patient? Yes  SHORT TERM GOALS: Target date: 11/06/2022 Patient will be independent with initial HEP. Baseline: Goal status: MET  2.  Pt will increase left knee A/ROM to 0 to 105 degrees to allow for pt to complete a full revolution on the recumbent bike. Baseline: 7-100 degrees Goal status: MET on 10/26/2022    LONG TERM GOALS: Target date: 12/11/2022  Pt will be independent with advanced HEP and self progression of HEP following discharge. Baseline:  Goal status: IN PROGRESS  2.  Patient will increase left knee A/ROM to 0 to 120 degrees to allow for easier navigation of stairs with reciprocal pattern. Baseline: 7-100 Goal status: MET on 10/30/2022  3.  Patient will increase generalized left leg strength to at least 4+ to 5-/5 to allow her to perform daily activities with increased ease. Baseline:  Goal status: INITIAL  4.  Patient will increase FOTO to at least 69% to demonstrate improvements in functional mobility. Baseline: 42% Goal status: INITIAL  5.  Patient will be able report to ambulate for at least 30 minutes outside without any increase in pain or loss of balance. Baseline:  Goal status: INITIAL    PLAN:  PT FREQUENCY: 2x/week  PT DURATION: 8 weeks  PLANNED INTERVENTIONS: Therapeutic exercises, Therapeutic activity, Neuromuscular re-education, Balance training, Gait training, Patient/Family education, Self Care, Joint mobilization, Joint manipulation, Stair training, Aquatic Therapy, Dry Needling, Cryotherapy, Moist heat, scar mobilization, Taping, Vasopneumatic device, Manual therapy, and  Re-evaluation  PLAN FOR NEXT SESSION: consider Ktape scar; progress and assess HEP as indicated, ROM, strengthening   Juel Burrow, PT 10/30/22 12:49 PM  [facility] [address] [place], [place] [address] Phone # [phone] Fax [phone]

## 2022-11-04 ENCOUNTER — Encounter: Payer: Self-pay | Admitting: Rehabilitative and Restorative Service Providers"

## 2022-11-04 ENCOUNTER — Ambulatory Visit: Payer: Medicare Other | Admitting: Rehabilitative and Restorative Service Providers"

## 2022-11-04 DIAGNOSIS — M6281 Muscle weakness (generalized): Secondary | ICD-10-CM

## 2022-11-04 DIAGNOSIS — M25662 Stiffness of left knee, not elsewhere classified: Secondary | ICD-10-CM

## 2022-11-04 DIAGNOSIS — R262 Difficulty in walking, not elsewhere classified: Secondary | ICD-10-CM | POA: Diagnosis not present

## 2022-11-04 DIAGNOSIS — Z96651 Presence of right artificial knee joint: Secondary | ICD-10-CM | POA: Diagnosis not present

## 2022-11-04 DIAGNOSIS — M25562 Pain in left knee: Secondary | ICD-10-CM

## 2022-11-04 DIAGNOSIS — R600 Localized edema: Secondary | ICD-10-CM

## 2022-11-04 DIAGNOSIS — Z96652 Presence of left artificial knee joint: Secondary | ICD-10-CM | POA: Diagnosis not present

## 2022-11-04 NOTE — Therapy (Signed)
OUTPATIENT PHYSICAL THERAPY TREATMENT NOTE   Patient Name: Kathleen Peters MRN: [id number] DOB:1950/11/11, 72 y.o., female Today's Date: 11/04/2022  PCP: Antony Contras, MD  REFERRING PROVIDER: Vickey Huger, MD  END OF SESSION:   PT End of Session - 11/04/22 0938     Visit Number 5    Date for PT Re-Evaluation 12/11/22    Authorization Type [employer] Medicare    Progress Note Due on Visit 10    PT Start Time 0934    PT Stop Time 1025    PT Time Calculation (min) 51 min    Activity Tolerance Patient tolerated treatment well    Behavior During Therapy WFL for tasks assessed/performed                 Past Medical History:  Diagnosis Date   Arthritis    Back pain    Cancer ([place])    hx of skin cancer   Graves disease    Hyperthyroidism    Insomnia    Migraines    Past Surgical History:  Procedure Laterality Date   ABDOMINAL HYSTERECTOMY     ELBOW FRACTURE SURGERY     LAPAROSCOPIC APPENDECTOMY N/A 01/29/2021   Procedure: APPENDECTOMY LAPAROSCOPIC;  Surgeon: Clovis Riley, MD;  Location: [facility];  Service: General;  Laterality: N/A;   TONSILLECTOMY     TOTAL KNEE ARTHROPLASTY Right 05/30/2021   Procedure: RIGHT TOTAL KNEE ARTHROPLASTY;  Surgeon: Mcarthur Rossetti, MD;  Location: [facility];  Service: Orthopedics;  Laterality: Right;   TYMPANOSTOMY TUBE PLACEMENT     Patient Active Problem List   Diagnosis Date Noted   Status post total right knee replacement 05/30/2021   Unilateral primary osteoarthritis, right knee 05/29/2021   Unilateral primary osteoarthritis, left knee 04/01/2021   Acute appendicitis 01/28/2021   Hyperthyroidism 04/14/2020   Heterozygous factor V Leiden mutation ([place]) 09/23/2015   Lupus anticoagulant positive 09/23/2015   Varicose veins of both lower extremities 09/23/2015   Thrombophlebitis of superficial veins of right lower extremity 04/24/2014    PCP: Antony Contras, MD  REFERRING PROVIDER: Vickey Huger, MD  REFERRING DIAG: s/p left  tka 1/24  THERAPY DIAG:  Left knee pain, unspecified chronicity  Difficulty in walking, not elsewhere classified  Muscle weakness (generalized)  Stiffness of left knee, not elsewhere classified  Localized edema  Rationale for Evaluation and Treatment: Rehabilitation  ONSET DATE: 09/16/2022  SUBJECTIVE:   SUBJECTIVE STATEMENT: Pt reports that her knee incision sensitivity seems to be a little better today.  PERTINENT HISTORY: R TKA October 2022, Graves Disease, Hypothyroidism  PAIN:  Are you having pain? Yes: NPRS scale: 1/10 Pain location: left knee Pain description: sharp, incision still very sore Aggravating factors: knee flexion Relieving factors: rest and cold pack  PRECAUTIONS: None  WEIGHT BEARING RESTRICTIONS: No  FALLS:  Has patient fallen in last 6 months? No  LIVING ENVIRONMENT: Lives with: lives alone Lives in: House/apartment Stairs: Yes: External: 3 steps; on left going up Has following equipment at home: Single point cane, Walker - 2 wheeled, and Grab bars  OCCUPATION: Retired  PLOF: Independent and Leisure: walking, golfing  PATIENT GOALS: To be able to walk properly without pain.  NEXT MD VISIT: 10/22/2022 with Dr Ronnie Derby  OBJECTIVE:   DIAGNOSTIC FINDINGS: Imaging following L TKA is unremarkable  PATIENT SURVEYS:  FOTO 42%  (projected 69% by visit 18)  COGNITION: Overall cognitive status: Within functional limits for tasks assessed     SENSATION: Pt reports paresthesias to left  knee incision  EDEMA:  Circumferential: left knee 39 cm, right knee 37 cm  MUSCLE LENGTH: Hamstrings: tightness noted  POSTURE: No Significant postural limitations  PALPATION: Pt with noted "puckering" of left knee incision. Pt with tenderness to palpation of areas of raised skin.  Pt reports that she has been doing some scar massage with Vitamin E.  LOWER EXTREMITY ROM:  10/19/2022: Left knee 7-100 A/ROM in sitting  10/17/22 Lt knee flexion 110  deg  10/30/2022 Left knee A/ROM in supine 0-122 degrees  LOWER EXTREMITY MMT:  10/19/2022: Left hip strength of 4/5, Left quad strength of 5-/5, left hamstring of 4/5  FUNCTIONAL TESTS:  Eval:   5 times sit to stand: 13.2 sec  GAIT: Distance walked: >100 ft Assistive device utilized: None Level of assistance: Complete Independence Comments: Antalgic gait with decreased stance time on LLE and decreased left knee flexion.   TODAY'S TREATMENT:                                                                                                                               DATE: 11/04/2022 Nustep level 5 x6 min with PT present to discuss status Seated with 2.5#:  heel/toe raises, marching, LAQ.  BLE 2x10 each Sit to/from stand holding 5# kettlebell 2x10 Ambulation down PT hallway and back with 2.5# weight on bilat ankle x2 laps Side stepping 2x15 ft bilat with 2.5# on bilat ankle Supine with 2.5#:  straight leg raise with quad set 2x10 bilat Supine bridge 2x10 Prone hamstring curl with 2# 2x10 bilat (towel roll under left knee) Supine left knee heel slides and hip abduction with foot on slider 2x10 each Supine hamstring stretch with strap 2x20 sec bilat Game ready: 3 snowflakes, medium compression, 10 min   DATE: 10/30/2022 Recumbent bike level 1 x6 min with PT present to discuss status Seated heel slides with foot on slider x15 bilat Seated with 2#:  heel/toe raises, marching, LAQ.  BLE 2x10 each Sit to/from stand holding 5# kettlebell x10 Supine with 2#:  straight leg raise with quad set 2x10 bilat Supine bridge 2x10 Prone hamstring curl with 2# 2x10 bilat (towel roll under left knee) Leg Press (seat at 7) 70# 2x10 Calf stretch on slanted rocker board 2x20 sec bilat Side and back lunges with unilateral foot on slider x10 reps bilat Step up and down staircase x2 laps with reciprocal pattern and heavy reliance on BUE Game ready: 3 snowflakes, medium compression, 10 min   DATE:  10/26/2022 Recumbent bike level 1 x6 min with PT present to discuss status Seated with 2#:  heel/toe raises, marching, LAQ.  BLE 2x10 each Sit to/from stand holding 5# kettlebell x10 Supine with 2#:  straight leg raise with quad set 2x10 bilat Supine bridge 2x10 Prone hamstring curl with 2# 2x10 bilat (towel roll under right knee) Leg Press 70# 2x10 Calf stretch on slanted rocker board 2x20 sec bilat High march on trampoline with UE support  x1 min Step ups on 6" step with UE support x10 bilat Game ready: 3 snowflakes, medium compression, 10 min    PATIENT EDUCATION:  Education details: scar massage Person educated: Patient Education method: Explanation, Demonstration, and Handouts Education comprehension: verbalized understanding and returned demonstration  HOME EXERCISE PROGRAM: Access Code: [email or web address] URL: [email or web address] Date: 10/30/2022 Prepared by: Shelby Dubin Onie Kasparek  Exercises - Supine Ankle Pumps  - 1 x daily - 7 x weekly - 2 sets - 10 reps - Supine Quadricep Sets  - 1 x daily - 7 x weekly - 2 sets - 10 reps - Active Straight Leg Raise with Quad Set  - 1 x daily - 7 x weekly - 2 sets - 10 reps - Supine Heel Slide  - 1 x daily - 7 x weekly - 2 sets - 10 reps - Seated Long Arc Quad  - 1 x daily - 7 x weekly - 2 sets - 10 reps - Seated Heel Slide  - 1 x daily - 7 x weekly - 2 sets - 10 reps - Standing Marching  - 1 x daily - 7 x weekly - 2 sets - 10 reps - Standing Hip Abduction with Counter Support  - 1 x daily - 7 x weekly - 2 sets - 10 reps - Standing Hip Extension with Counter Support  - 1 x daily - 7 x weekly - 2 sets - 10 reps - Standing Knee Flexion AROM with Chair Support  - 1 x daily - 7 x weekly - 2 sets - 10 reps - Sit to Stand with Arms Crossed  - 1 x daily - 7 x weekly - 2 sets - 10 reps  ASSESSMENT:  CLINICAL IMPRESSION: Ms Cumings presents to skilled PT with continued progress towards overall decreased pain.  Patient able to progress with  increased weight, but did report increased muscle fatigue with exercise.  Patient required some cuing with side stepping to maintain left foot positioning.  Patient to return to Dr Ronnie Derby next week and he will reassess progress at that time.   OBJECTIVE IMPAIRMENTS: difficulty walking, decreased ROM, decreased strength, and pain.   ACTIVITY LIMITATIONS: bending, standing, squatting, and stairs  PARTICIPATION LIMITATIONS: cleaning and community activity  PERSONAL FACTORS: Past/current experiences and 1-2 comorbidities: s/p R TKA in 2022, s/p L TKA on 09/16/2022  are also affecting patient's functional outcome.   REHAB POTENTIAL: Good  CLINICAL DECISION MAKING: Stable/uncomplicated  EVALUATION COMPLEXITY: Low   GOALS: Goals reviewed with patient? Yes  SHORT TERM GOALS: Target date: 11/06/2022 Patient will be independent with initial HEP. Baseline: Goal status: MET  2.  Pt will increase left knee A/ROM to 0 to 105 degrees to allow for pt to complete a full revolution on the recumbent bike. Baseline: 7-100 degrees Goal status: MET on 10/26/2022    LONG TERM GOALS: Target date: 12/11/2022  Pt will be independent with advanced HEP and self progression of HEP following discharge. Baseline:  Goal status: IN PROGRESS  2.  Patient will increase left knee A/ROM to 0 to 120 degrees to allow for easier navigation of stairs with reciprocal pattern. Baseline: 7-100 Goal status: MET on 10/30/2022  3.  Patient will increase generalized left leg strength to at least 4+ to 5-/5 to allow her to perform daily activities with increased ease. Baseline:  Goal status: INITIAL  4.  Patient will increase FOTO to at least 69% to demonstrate improvements in functional mobility. Baseline: 42% Goal status: INITIAL  5.  Patient  will be able report to ambulate for at least 30 minutes outside without any increase in pain or loss of balance. Baseline:  Goal status: INITIAL    PLAN:  PT FREQUENCY:  2x/week  PT DURATION: 8 weeks  PLANNED INTERVENTIONS: Therapeutic exercises, Therapeutic activity, Neuromuscular re-education, Balance training, Gait training, Patient/Family education, Self Care, Joint mobilization, Joint manipulation, Stair training, Aquatic Therapy, Dry Needling, Cryotherapy, Moist heat, scar mobilization, Taping, Vasopneumatic device, Manual therapy, and Re-evaluation  PLAN FOR NEXT SESSION: progress and assess HEP as indicated, ROM, strengthening   Juel Burrow, PT 11/04/22 10:19 AM  [facility] [address] [place], [place] [address] Phone # [phone] Fax [phone]

## 2022-11-09 ENCOUNTER — Encounter: Payer: Self-pay | Admitting: Rehabilitative and Restorative Service Providers"

## 2022-11-09 ENCOUNTER — Ambulatory Visit: Payer: Medicare Other | Admitting: Rehabilitative and Restorative Service Providers"

## 2022-11-09 DIAGNOSIS — R600 Localized edema: Secondary | ICD-10-CM

## 2022-11-09 DIAGNOSIS — M25562 Pain in left knee: Secondary | ICD-10-CM

## 2022-11-09 DIAGNOSIS — M25662 Stiffness of left knee, not elsewhere classified: Secondary | ICD-10-CM

## 2022-11-09 DIAGNOSIS — M6281 Muscle weakness (generalized): Secondary | ICD-10-CM

## 2022-11-09 DIAGNOSIS — R262 Difficulty in walking, not elsewhere classified: Secondary | ICD-10-CM

## 2022-11-09 DIAGNOSIS — Z96651 Presence of right artificial knee joint: Secondary | ICD-10-CM | POA: Diagnosis not present

## 2022-11-09 DIAGNOSIS — Z96652 Presence of left artificial knee joint: Secondary | ICD-10-CM | POA: Diagnosis not present

## 2022-11-09 NOTE — Therapy (Signed)
OUTPATIENT PHYSICAL THERAPY TREATMENT NOTE AND DISCHARGE SUMMARY   Patient Name: Kathleen Peters MRN: [id number] DOB:March 26, 1951, 72 y.o., female Today's Date: 11/09/2022  PCP: Antony Contras, MD  REFERRING PROVIDER: Vickey Huger, MD  END OF SESSION:   PT End of Session - 11/09/22 1020     Visit Number 6    Date for PT Re-Evaluation 12/11/22    Authorization Type [employer]    Progress Note Due on Visit 10    PT Start Time 1015    PT Stop Time 1105    PT Time Calculation (min) 50 min    Activity Tolerance Patient tolerated treatment well    Behavior During Therapy WFL for tasks assessed/performed                 Past Medical History:  Diagnosis Date   Arthritis    Back pain    Cancer ([place])    hx of skin cancer   Graves disease    Hyperthyroidism    Insomnia    Migraines    Past Surgical History:  Procedure Laterality Date   ABDOMINAL HYSTERECTOMY     ELBOW FRACTURE SURGERY     LAPAROSCOPIC APPENDECTOMY N/A 01/29/2021   Procedure: APPENDECTOMY LAPAROSCOPIC;  Surgeon: Clovis Riley, MD;  Location: [facility];  Service: General;  Laterality: N/A;   TONSILLECTOMY     TOTAL KNEE ARTHROPLASTY Right 05/30/2021   Procedure: RIGHT TOTAL KNEE ARTHROPLASTY;  Surgeon: Mcarthur Rossetti, MD;  Location: [facility];  Service: Orthopedics;  Laterality: Right;   TYMPANOSTOMY TUBE PLACEMENT     Patient Active Problem List   Diagnosis Date Noted   Status post total right knee replacement 05/30/2021   Unilateral primary osteoarthritis, right knee 05/29/2021   Unilateral primary osteoarthritis, left knee 04/01/2021   Acute appendicitis 01/28/2021   Hyperthyroidism 04/14/2020   Heterozygous factor V Leiden mutation ([place]) 09/23/2015   Lupus anticoagulant positive 09/23/2015   Varicose veins of both lower extremities 09/23/2015   Thrombophlebitis of superficial veins of right lower extremity 04/24/2014    PCP: Antony Contras, MD  REFERRING PROVIDER: Vickey Huger,  MD  REFERRING DIAG: s/p left tka 1/24  THERAPY DIAG:  Left knee pain, unspecified chronicity  Difficulty in walking, not elsewhere classified  Muscle weakness (generalized)  Stiffness of left knee, not elsewhere classified  Localized edema  Rationale for Evaluation and Treatment: Rehabilitation  ONSET DATE: 09/16/2022  SUBJECTIVE:   SUBJECTIVE STATEMENT: Pt reports that she had noted a big turn around in her pain and feels that she is ready to progress with HEP and starting at the gym.  PERTINENT HISTORY: R TKA October 2022, Graves Disease, Hypothyroidism  PAIN:  Are you having pain? Yes: NPRS scale: 0-1/10 Pain location: left knee Pain description: sharp, incision still very sore Aggravating factors: knee flexion Relieving factors: rest and cold pack  PRECAUTIONS: None  WEIGHT BEARING RESTRICTIONS: No  FALLS:  Has patient fallen in last 6 months? No  LIVING ENVIRONMENT: Lives with: lives alone Lives in: House/apartment Stairs: Yes: External: 3 steps; on left going up Has following equipment at home: Single point cane, Walker - 2 wheeled, and Grab bars  OCCUPATION: Retired  PLOF: Independent and Leisure: walking, golfing  PATIENT GOALS: To be able to walk properly without pain.  NEXT MD VISIT: 10/22/2022 with Dr Ronnie Derby  OBJECTIVE:   DIAGNOSTIC FINDINGS: Imaging following L TKA is unremarkable  PATIENT SURVEYS:  FOTO 42%  (projected 69% by visit 18) FOTO 76%  COGNITION:  Overall cognitive status: Within functional limits for tasks assessed     SENSATION: Pt reports paresthesias to left knee incision  EDEMA:  Eval:  Circumferential: left knee 39 cm, right knee 37 cm 11/09/2022:  Circumferential: left knee 39 cm, right knee 39 cm  MUSCLE LENGTH: Hamstrings: tightness noted  POSTURE: No Significant postural limitations  PALPATION: Pt with noted "puckering" of left knee incision. Pt with tenderness to palpation of areas of raised skin.  Pt reports  that she has been doing some scar massage with Vitamin E.  LOWER EXTREMITY ROM:  10/19/2022: Left knee 7-100 A/ROM in sitting  10/17/22 Lt knee flexion 110 deg  10/30/2022 Left knee A/ROM in supine 0-122 degrees  LOWER EXTREMITY MMT:  10/19/2022: Left hip strength of 4/5, Left quad strength of 5-/5, left hamstring of 4/5  11/09/2022: Left hip and knee strength of at least 5- to 5/5 throughout  FUNCTIONAL TESTS:  Eval:   5 times sit to stand: 13.2 sec  GAIT: Distance walked: >100 ft Assistive device utilized: None Level of assistance: Complete Independence Comments: Antalgic gait with decreased stance time on LLE and decreased left knee flexion.   TODAY'S TREATMENT:                                                                                                                               DATE: 11/09/2022 Nustep level 5 x5 min with PT present to discuss status FOTO 76% Standing at barre with 2.5# on each ankle:  heel raises, marching, hip abduction, hip extension, hamstring curl.  2x10 each bilat Ambulation around PT clinic with 2.5# on bilat ankles Seated toe eversion with yellow tband 2x10 Leg Press (seat at 7) 90# x10, 80# x10 Standing hamstring stretch at step 2x20 sec bilat Recumbent bike level 2 x5 min with PT present to discuss status and plan for HEP after discharge/Silver Sneakers Game ready: 3 snowflakes, medium compression, 10 min   DATE: 11/04/2022 Nustep level 5 x6 min with PT present to discuss status Seated with 2.5#:  heel/toe raises, marching, LAQ.  BLE 2x10 each Sit to/from stand holding 5# kettlebell 2x10 Ambulation down PT hallway and back with 2.5# weight on bilat ankle x2 laps Side stepping 2x15 ft bilat with 2.5# on bilat ankle Supine with 2.5#:  straight leg raise with quad set 2x10 bilat Supine bridge 2x10 Prone hamstring curl with 2# 2x10 bilat (towel roll under left knee) Supine left knee heel slides and hip abduction with foot on slider 2x10  each Supine hamstring stretch with strap 2x20 sec bilat Game ready: 3 snowflakes, medium compression, 10 min   DATE: 10/30/2022 Recumbent bike level 1 x6 min with PT present to discuss status Seated heel slides with foot on slider x15 bilat Seated with 2#:  heel/toe raises, marching, LAQ.  BLE 2x10 each Sit to/from stand holding 5# kettlebell x10 Supine with 2#:  straight leg raise with quad set 2x10 bilat Supine bridge 2x10  Prone hamstring curl with 2# 2x10 bilat (towel roll under left knee) Leg Press (seat at 7) 70# 2x10 Calf stretch on slanted rocker board 2x20 sec bilat Side and back lunges with unilateral foot on slider x10 reps bilat Step up and down staircase x2 laps with reciprocal pattern and heavy reliance on BUE Game ready: 3 snowflakes, medium compression, 10 min     PATIENT EDUCATION:  Education details: scar massage Person educated: Patient Education method: Explanation, Demonstration, and Handouts Education comprehension: verbalized understanding and returned demonstration  HOME EXERCISE PROGRAM: Access Code: [email or web address] URL: [email or web address] Date: 10/30/2022 Prepared by: Shelby Dubin Shann Lewellyn  Exercises - Supine Ankle Pumps  - 1 x daily - 7 x weekly - 2 sets - 10 reps - Supine Quadricep Sets  - 1 x daily - 7 x weekly - 2 sets - 10 reps - Active Straight Leg Raise with Quad Set  - 1 x daily - 7 x weekly - 2 sets - 10 reps - Supine Heel Slide  - 1 x daily - 7 x weekly - 2 sets - 10 reps - Seated Long Arc Quad  - 1 x daily - 7 x weekly - 2 sets - 10 reps - Seated Heel Slide  - 1 x daily - 7 x weekly - 2 sets - 10 reps - Standing Marching  - 1 x daily - 7 x weekly - 2 sets - 10 reps - Standing Hip Abduction with Counter Support  - 1 x daily - 7 x weekly - 2 sets - 10 reps - Standing Hip Extension with Counter Support  - 1 x daily - 7 x weekly - 2 sets - 10 reps - Standing Knee Flexion AROM with Chair Support  - 1 x daily - 7 x weekly - 2 sets - 10 reps -  Sit to Stand with Arms Crossed  - 1 x daily - 7 x weekly - 2 sets - 10 reps  ASSESSMENT:  CLINICAL IMPRESSION: Ms Liechty presents to skilled PT with continued progress towards overall decreased pain and reports that she was able to walk to/from the parking garage at the [facility] without increased pain.  Patient reports that she is able to walk around the store for at least 30 min without increased pain.  Patient has met all goals at this time and has improved LE strength.  Pt has met FOTO goal and has A/ROM of her knees that is [facility].  Patient with increased strength overall and has met her goals with outpatient PT.  Patient recommended to join [employer] gym to progress with continued gains.   OBJECTIVE IMPAIRMENTS: difficulty walking, decreased ROM, decreased strength, and pain.   ACTIVITY LIMITATIONS: bending, standing, squatting, and stairs  PARTICIPATION LIMITATIONS: cleaning and community activity  PERSONAL FACTORS: Past/current experiences and 1-2 comorbidities: s/p R TKA in 2022, s/p L TKA on 09/16/2022  are also affecting patient's functional outcome.   REHAB POTENTIAL: Good  CLINICAL DECISION MAKING: Stable/uncomplicated  EVALUATION COMPLEXITY: Low   GOALS: Goals reviewed with patient? Yes  SHORT TERM GOALS: Target date: 11/06/2022 Patient will be independent with initial HEP. Baseline: Goal status: MET  2.  Pt will increase left knee A/ROM to 0 to 105 degrees to allow for pt to complete a full revolution on the recumbent bike. Baseline: 7-100 degrees Goal status: MET on 10/26/2022    LONG TERM GOALS: Target date: 12/11/2022  Pt will be independent with advanced HEP and self progression of HEP following discharge. Baseline:  Goal status: MET  2.  Patient will increase left knee A/ROM to 0 to 120 degrees to allow for easier navigation of stairs with reciprocal pattern. Baseline: 7-100 Goal status: MET on 10/30/2022  3.  Patient will increase generalized left  leg strength to at least 4+ to 5-/5 to allow her to perform daily activities with increased ease. Baseline:  Goal status: MET  4.  Patient will increase FOTO to at least 69% to demonstrate improvements in functional mobility. Baseline: 42% Goal status: MET  5.  Patient will be able report to ambulate for at least 30 minutes outside without any increase in pain or loss of balance. Baseline:  Goal status: MET    PLAN:  PT FREQUENCY: 2x/week  PT DURATION: 8 weeks  PLANNED INTERVENTIONS: Therapeutic exercises, Therapeutic activity, Neuromuscular re-education, Balance training, Gait training, Patient/Family education, Self Care, Joint mobilization, Joint manipulation, Stair training, Aquatic Therapy, Dry Needling, Cryotherapy, Moist heat, scar mobilization, Taping, Vasopneumatic device, Manual therapy, and Re-evaluation    PHYSICAL THERAPY DISCHARGE SUMMARY   Patient agrees to discharge. Patient goals were met. Patient is being discharged due to meeting the stated rehab goals.     Juel Burrow, PT 11/09/22 12:17 PM  [facility] [address] [place], [place] [address] Phone # [phone] Fax [phone]

## 2022-11-10 DIAGNOSIS — Z96652 Presence of left artificial knee joint: Secondary | ICD-10-CM | POA: Diagnosis not present

## 2022-11-16 ENCOUNTER — Encounter: Payer: Medicare Other | Admitting: Rehabilitative and Restorative Service Providers"

## 2022-11-18 ENCOUNTER — Encounter: Payer: Medicare Other | Admitting: Rehabilitative and Restorative Service Providers"

## 2022-11-24 ENCOUNTER — Encounter: Payer: Medicare Other | Admitting: Rehabilitative and Restorative Service Providers"

## 2022-11-26 ENCOUNTER — Encounter: Payer: Medicare Other | Admitting: Rehabilitative and Restorative Service Providers"

## 2022-12-21 DIAGNOSIS — Z85828 Personal history of other malignant neoplasm of skin: Secondary | ICD-10-CM | POA: Diagnosis not present

## 2022-12-21 DIAGNOSIS — L603 Nail dystrophy: Secondary | ICD-10-CM | POA: Diagnosis not present

## 2022-12-21 DIAGNOSIS — B351 Tinea unguium: Secondary | ICD-10-CM | POA: Diagnosis not present

## 2023-02-08 DIAGNOSIS — E0500 Thyrotoxicosis with diffuse goiter without thyrotoxic crisis or storm: Secondary | ICD-10-CM | POA: Diagnosis not present

## 2023-02-08 DIAGNOSIS — E0590 Thyrotoxicosis, unspecified without thyrotoxic crisis or storm: Secondary | ICD-10-CM | POA: Diagnosis not present

## 2023-03-08 DIAGNOSIS — Z1231 Encounter for screening mammogram for malignant neoplasm of breast: Secondary | ICD-10-CM | POA: Diagnosis not present

## 2023-03-09 DIAGNOSIS — Z96659 Presence of unspecified artificial knee joint: Secondary | ICD-10-CM | POA: Diagnosis not present

## 2023-03-09 DIAGNOSIS — T8484XA Pain due to internal orthopedic prosthetic devices, implants and grafts, initial encounter: Secondary | ICD-10-CM | POA: Diagnosis not present

## 2023-03-09 DIAGNOSIS — T84093A Other mechanical complication of internal left knee prosthesis, initial encounter: Secondary | ICD-10-CM | POA: Diagnosis not present

## 2023-03-29 DIAGNOSIS — Z5181 Encounter for therapeutic drug level monitoring: Secondary | ICD-10-CM | POA: Diagnosis not present

## 2023-03-29 DIAGNOSIS — E0590 Thyrotoxicosis, unspecified without thyrotoxic crisis or storm: Secondary | ICD-10-CM | POA: Diagnosis not present

## 2023-05-03 DIAGNOSIS — E0590 Thyrotoxicosis, unspecified without thyrotoxic crisis or storm: Secondary | ICD-10-CM | POA: Diagnosis not present

## 2023-05-24 DIAGNOSIS — G4700 Insomnia, unspecified: Secondary | ICD-10-CM | POA: Diagnosis not present

## 2023-05-24 DIAGNOSIS — E7800 Pure hypercholesterolemia, unspecified: Secondary | ICD-10-CM | POA: Diagnosis not present

## 2023-05-24 DIAGNOSIS — E1169 Type 2 diabetes mellitus with other specified complication: Secondary | ICD-10-CM | POA: Diagnosis not present

## 2023-05-24 DIAGNOSIS — M25561 Pain in right knee: Secondary | ICD-10-CM | POA: Diagnosis not present

## 2023-05-24 DIAGNOSIS — M8588 Other specified disorders of bone density and structure, other site: Secondary | ICD-10-CM | POA: Diagnosis not present

## 2023-05-24 DIAGNOSIS — E119 Type 2 diabetes mellitus without complications: Secondary | ICD-10-CM | POA: Diagnosis not present

## 2023-05-24 DIAGNOSIS — Z0000 Encounter for general adult medical examination without abnormal findings: Secondary | ICD-10-CM | POA: Diagnosis not present

## 2023-05-24 DIAGNOSIS — K7689 Other specified diseases of liver: Secondary | ICD-10-CM | POA: Diagnosis not present

## 2023-05-24 DIAGNOSIS — M48061 Spinal stenosis, lumbar region without neurogenic claudication: Secondary | ICD-10-CM | POA: Diagnosis not present

## 2023-05-24 DIAGNOSIS — E611 Iron deficiency: Secondary | ICD-10-CM | POA: Diagnosis not present

## 2023-05-24 DIAGNOSIS — Z23 Encounter for immunization: Secondary | ICD-10-CM | POA: Diagnosis not present

## 2023-05-24 DIAGNOSIS — E0590 Thyrotoxicosis, unspecified without thyrotoxic crisis or storm: Secondary | ICD-10-CM | POA: Diagnosis not present

## 2023-05-24 DIAGNOSIS — G43909 Migraine, unspecified, not intractable, without status migrainosus: Secondary | ICD-10-CM | POA: Diagnosis not present

## 2023-06-01 DIAGNOSIS — T8484XA Pain due to internal orthopedic prosthetic devices, implants and grafts, initial encounter: Secondary | ICD-10-CM | POA: Diagnosis not present

## 2023-06-01 DIAGNOSIS — Z96659 Presence of unspecified artificial knee joint: Secondary | ICD-10-CM | POA: Diagnosis not present

## 2023-06-03 DIAGNOSIS — H02055 Trichiasis without entropian left lower eyelid: Secondary | ICD-10-CM | POA: Diagnosis not present

## 2023-06-03 DIAGNOSIS — Z961 Presence of intraocular lens: Secondary | ICD-10-CM | POA: Diagnosis not present

## 2023-06-08 DIAGNOSIS — R944 Abnormal results of kidney function studies: Secondary | ICD-10-CM | POA: Diagnosis not present

## 2023-06-08 DIAGNOSIS — E0590 Thyrotoxicosis, unspecified without thyrotoxic crisis or storm: Secondary | ICD-10-CM | POA: Diagnosis not present

## 2023-06-18 ENCOUNTER — Other Ambulatory Visit: Payer: Self-pay | Admitting: Gastroenterology

## 2023-06-18 DIAGNOSIS — K7689 Other specified diseases of liver: Secondary | ICD-10-CM

## 2023-06-18 DIAGNOSIS — N281 Cyst of kidney, acquired: Secondary | ICD-10-CM

## 2023-06-18 DIAGNOSIS — K869 Disease of pancreas, unspecified: Secondary | ICD-10-CM

## 2023-07-01 ENCOUNTER — Ambulatory Visit
Admission: RE | Admit: 2023-07-01 | Discharge: 2023-07-01 | Disposition: A | Discharge status: Home / Self Care | Payer: Medicare Other | Source: Ambulatory Visit | Attending: Gastroenterology | Admitting: Gastroenterology

## 2023-07-01 DIAGNOSIS — N281 Cyst of kidney, acquired: Secondary | ICD-10-CM | POA: Diagnosis not present

## 2023-07-01 DIAGNOSIS — K7689 Other specified diseases of liver: Secondary | ICD-10-CM | POA: Diagnosis not present

## 2023-07-01 DIAGNOSIS — K869 Disease of pancreas, unspecified: Secondary | ICD-10-CM | POA: Diagnosis not present

## 2023-07-01 MED ORDER — IOPAMIDOL (ISOVUE-300) INJECTION 61%
100.0000 mL | Freq: Once | INTRAVENOUS | Status: AC | PRN
  Administered 2023-07-01: 100 mL via INTRAVENOUS

## 2023-07-15 DIAGNOSIS — E0590 Thyrotoxicosis, unspecified without thyrotoxic crisis or storm: Secondary | ICD-10-CM | POA: Diagnosis not present

## 2023-08-10 NOTE — Progress Notes (Signed)
Sent message, via epic in basket, requesting orders in epic from surgeon.  

## 2023-08-16 NOTE — Patient Instructions (Signed)
SURGICAL WAITING ROOM VISITATION Patients having surgery or a procedure may have no more than 2 support people in the waiting area - these visitors may rotate.    Children under the age of 89 must have an adult with them who is not the patient.  If the patient needs to stay at the hospital during part of their recovery, the visitor guidelines for inpatient rooms apply. Pre-op nurse will coordinate an appropriate time for 1 support person to accompany patient in pre-op.  This support person may not rotate.    Please refer to the [facility] website for the visitor guidelines for Inpatients (after your surgery is over and you are in a regular room).      Your procedure is scheduled on: 08/30/23   Report to [facility] Main Entrance    Report to admitting at : 8:30 AM   Call this number if you have problems the morning of surgery [phone]   Do not eat food :After Midnight.   After Midnight you may have the following liquids until : 8:00 AM DAY OF SURGERY  Water Black Coffee (sugar ok, NO MILK/CREAM OR CREAMERS)  Tea (sugar ok, NO MILK/CREAM OR CREAMERS) regular and decaf                             Plain Jell-O (NO RED)                                           Fruit ices (not with fruit pulp, NO RED)                                     Popsicles (NO RED)                                                                  Juice: apple, WHITE grape, WHITE cranberry Sports drinks like Gatorade (NO RED)          If you have questions, please contact your surgeon's office.  FOLLOW ANY ADDITIONAL PRE OP INSTRUCTIONS YOU RECEIVED FROM YOUR SURGEON'S OFFICE!!   Oral Hygiene is also important to reduce your risk of infection.                                    Remember - BRUSH YOUR TEETH THE MORNING OF SURGERY WITH YOUR REGULAR TOOTHPASTE  DENTURES WILL BE REMOVED PRIOR TO SURGERY PLEASE DO NOT APPLY "Poly grip" OR ADHESIVES!!!   Do NOT smoke after Midnight  Take these  medicines the morning of surgery with A SIP OF WATER: Atenolol Estradiol Methimazole If needed Tylenol and Eletriptan                              You may not have any metal on your body including hair pins, jewelry, and body piercing  Do not wear make-up, lotions, powders, perfumes/cologne, or deodorant  Do not wear nail polish including gel and S&S, artificial/acrylic nails, or any other type of covering on natural nails including finger and toenails. If you have artificial nails, gel coating, etc. that needs to be removed by a nail salon please have this removed prior to surgery or surgery may need to be canceled/ delayed if the surgeon/ anesthesia feels like they are unable to be safely monitored.   Do not shave  48 hours prior to surgery.    Do not bring valuables to the hospital. [facility] IS NOT RESPONSIBLE   FOR VALUABLES.   Contacts, glasses, or bridgework may not be worn into surgery.   Bring small overnight bag day of surgery.   DO NOT BRING YOUR HOME MEDICATIONS TO THE HOSPITAL. PHARMACY WILL DISPENSE MEDICATIONS LISTED ON YOUR MEDICATION LIST TO YOU DURING YOUR ADMISSION IN THE HOSPITAL!    Patients discharged on the day of surgery will not be allowed to drive home.  Someone NEEDS to stay with you for the first 24 hours after anesthesia.   Special Instructions: Bring a copy of your healthcare power of attorney and living will documents         the day of surgery if you haven't scanned them before.              Please read over the following fact sheets you were given: IF YOU HAVE QUESTIONS ABOUT YOUR PRE-OP INSTRUCTIONS PLEASE CALL [phone] [facility]      Pre-operative 5 CHG Bath Instructions   You can play a key role in reducing the risk of infection after surgery. Your skin needs to be as free of germs as possible. You can reduce the number of germs on your skin by washing with CHG (chlorhexidine gluconate) soap before surgery. CHG is an antiseptic soap  that kills germs and continues to kill germs even after washing.   DO NOT use if you have an allergy to chlorhexidine/CHG or antibacterial soaps. If your skin becomes reddened or irritated, stop using the CHG and notify one of our RNs at : [phone].   Please shower with the CHG soap starting 4 days before surgery using the following schedule:     Please keep in mind the following:  DO NOT shave, including legs and underarms, starting the day of your first shower.   You may shave your face at any point before/day of surgery.  Place clean sheets on your bed the day you start using CHG soap. Use a clean washcloth (not used since being washed) for each shower. DO NOT sleep with pets once you start using the CHG.   CHG Shower Instructions:  If you choose to wash your hair and private area, wash first with your normal shampoo/soap.  After you use shampoo/soap, rinse your hair and body thoroughly to remove shampoo/soap residue.  Turn the water OFF and apply about 3 tablespoons (45 ml) of CHG soap to a CLEAN washcloth.  Apply CHG soap ONLY FROM YOUR NECK DOWN TO YOUR TOES (washing for 3-5 minutes)  DO NOT use CHG soap on face, private areas, open wounds, or sores.  Pay special attention to the area where your surgery is being performed.  If you are having back surgery, having someone wash your back for you may be helpful. Wait 2 minutes after CHG soap is applied, then you may rinse off the CHG soap.  Pat dry with a clean towel  Put on clean clothes/pajamas   If you choose to wear lotion, please use ONLY the CHG-compatible lotions on the back of this paper.     Additional instructions for the day of surgery: DO NOT APPLY any lotions, deodorants, cologne, or perfumes.   Put on clean/comfortable clothes.  Brush your teeth.  Ask your nurse before applying any prescription medications to the skin.   CHG Compatible Lotions   Aveeno Moisturizing lotion  Cetaphil Moisturizing Cream   Cetaphil Moisturizing Lotion  Clairol Herbal Essence Moisturizing Lotion, Dry Skin  Clairol Herbal Essence Moisturizing Lotion, Extra Dry Skin  Clairol Herbal Essence Moisturizing Lotion, Normal Skin  Curel Age Defying Therapeutic Moisturizing Lotion with Alpha Hydroxy  Curel Extreme Care Body Lotion  Curel Soothing Hands Moisturizing Hand Lotion  Curel Therapeutic Moisturizing Cream, Fragrance-Free  Curel Therapeutic Moisturizing Lotion, Fragrance-Free  Curel Therapeutic Moisturizing Lotion, Original Formula  Eucerin Daily Replenishing Lotion  Eucerin Dry Skin Therapy Plus Alpha Hydroxy Crme  Eucerin Dry Skin Therapy Plus Alpha Hydroxy Lotion  Eucerin Original Crme  Eucerin Original Lotion  Eucerin Plus Crme Eucerin Plus Lotion  Eucerin TriLipid Replenishing Lotion  Keri Anti-Bacterial Hand Lotion  Keri Deep Conditioning Original Lotion Dry Skin Formula Softly Scented  Keri Deep Conditioning Original Lotion, Fragrance Free Sensitive Skin Formula  Keri Lotion Fast Absorbing Fragrance Free Sensitive Skin Formula  Keri Lotion Fast Absorbing Softly Scented Dry Skin Formula  Keri Original Lotion  Keri Skin Renewal Lotion Keri Silky Smooth Lotion  Keri Silky Smooth Sensitive Skin Lotion  Nivea Body Creamy Conditioning Oil  Nivea Body Extra Enriched Lotion  Nivea Body Original Lotion  Nivea Body Sheer Moisturizing Lotion Nivea Crme  Nivea Skin Firming Lotion  NutraDerm 30 Skin Lotion  NutraDerm Skin Lotion  NutraDerm Therapeutic Skin Cream  NutraDerm Therapeutic Skin Lotion  ProShield Protective Hand Cream  Provon moisturizing lotion   Incentive Spirometer  An incentive spirometer is a tool that can help keep your lungs clear and active. This tool measures how well you are filling your lungs with each breath. Taking long deep breaths may help reverse or decrease the chance of developing breathing (pulmonary) problems (especially infection) following: A long period of time when  you are unable to move or be active. BEFORE THE PROCEDURE  If the spirometer includes an indicator to show your best effort, your nurse or respiratory therapist will set it to a desired goal. If possible, sit up straight or lean slightly forward. Try not to slouch. Hold the incentive spirometer in an upright position. INSTRUCTIONS FOR USE  Sit on the edge of your bed if possible, or sit up as far as you can in bed or on a chair. Hold the incentive spirometer in an upright position. Breathe out normally. Place the mouthpiece in your mouth and seal your lips tightly around it. Breathe in slowly and as deeply as possible, raising the piston or the ball toward the top of the column. Hold your breath for 3-5 seconds or for as long as possible. Allow the piston or ball to fall to the bottom of the column. Remove the mouthpiece from your mouth and breathe out normally. Rest for a few seconds and repeat Steps 1 through 7 at least 10 times every 1-2 hours when you are awake. Take your time and take a few normal breaths between deep breaths. The spirometer may include an indicator to show your best effort. Use the indicator as a goal to work toward during each repetition. After each  set of 10 deep breaths, practice coughing to be sure your lungs are clear. If you have an incision (the cut made at the time of surgery), support your incision when coughing by placing a pillow or rolled up towels firmly against it. Once you are able to get out of bed, walk around indoors and cough well. You may stop using the incentive spirometer when instructed by your caregiver.  RISKS AND COMPLICATIONS Take your time so you do not get dizzy or light-headed. If you are in pain, you may need to take or ask for pain medication before doing incentive spirometry. It is harder to take a deep breath if you are having pain. AFTER USE Rest and breathe slowly and easily. It can be helpful to keep track of a log of your progress.  Your caregiver can provide you with a simple table to help with this. If you are using the spirometer at home, follow these instructions: SEEK MEDICAL CARE IF:  You are having difficultly using the spirometer. You have trouble using the spirometer as often as instructed. Your pain medication is not giving enough relief while using the spirometer. You develop fever of 100.5 F (38.1 C) or higher. SEEK IMMEDIATE MEDICAL CARE IF:  You cough up bloody sputum that had not been present before. You develop fever of 102 F (38.9 C) or greater. You develop worsening pain at or near the incision site. MAKE SURE YOU:  Understand these instructions. Will watch your condition. Will get help right away if you are not doing well or get worse. Document Released: 12/21/2006 Document Revised: 11/02/2011 Document Reviewed: 02/21/2007 Exit[employer] Patient Information 2014 Exit[employer], [place].   ________________________________________________________________________  WHAT IS A BLOOD TRANSFUSION? Blood Transfusion Information  A transfusion is the replacement of blood or some of its parts. Blood is made up of multiple cells which provide different functions. Red blood cells carry oxygen and are used for blood loss replacement. White blood cells fight against infection. Platelets control bleeding. Plasma helps clot blood. Other blood products are available for specialized needs, such as hemophilia or other clotting disorders. BEFORE THE TRANSFUSION  Who gives blood for transfusions?  Healthy volunteers who are fully evaluated to make sure their blood is safe. This is blood bank blood. Transfusion therapy is the safest it has ever been in the practice of medicine. Before blood is taken from a donor, a complete history is taken to make sure that person has no history of diseases nor engages in risky social behavior (examples are intravenous drug use or sexual activity with multiple partners). The donor's travel  history is screened to minimize risk of transmitting infections, such as malaria. The donated blood is tested for signs of infectious diseases, such as HIV and hepatitis. The blood is then tested to be sure it is compatible with you in order to minimize the chance of a transfusion reaction. If you or a relative donates blood, this is often done in anticipation of surgery and is not appropriate for emergency situations. It takes many days to process the donated blood. RISKS AND COMPLICATIONS Although transfusion therapy is very safe and saves many lives, the main dangers of transfusion include:  Getting an infectious disease. Developing a transfusion reaction. This is an allergic reaction to something in the blood you were given. Every precaution is taken to prevent this. The decision to have a blood transfusion has been considered carefully by your caregiver before blood is given. Blood is not given unless the benefits outweigh  the risks. AFTER THE TRANSFUSION Right after receiving a blood transfusion, you will usually feel much better and more energetic. This is especially true if your red blood cells have gotten low (anemic). The transfusion raises the level of the red blood cells which carry oxygen, and this usually causes an energy increase. The nurse administering the transfusion will monitor you carefully for complications. HOME CARE INSTRUCTIONS  No special instructions are needed after a transfusion. You may find your energy is better. Speak with your caregiver about any limitations on activity for underlying diseases you may have. SEEK MEDICAL CARE IF:  Your condition is not improving after your transfusion. You develop redness or irritation at the intravenous (IV) site. SEEK IMMEDIATE MEDICAL CARE IF:  Any of the following symptoms occur over the next 12 hours: Shaking chills. You have a temperature by mouth above 102 F (38.9 C), not controlled by medicine. Chest, back, or muscle  pain. People around you feel you are not acting correctly or are confused. Shortness of breath or difficulty breathing. Dizziness and fainting. You get a rash or develop hives. You have a decrease in urine output. Your urine turns a dark color or changes to pink, red, or brown. Any of the following symptoms occur over the next 10 days: You have a temperature by mouth above 102 F (38.9 C), not controlled by medicine. Shortness of breath. Weakness after normal activity. The white part of the eye turns yellow (jaundice). You have a decrease in the amount of urine or are urinating less often. Your urine turns a dark color or changes to pink, red, or brown. Document Released: 08/07/2000 Document Revised: 11/02/2011 Document Reviewed: 03/26/2008 [facility] Patient Information 2014 [place], [place].  _______________________________________________________________________

## 2023-08-19 ENCOUNTER — Encounter (HOSPITAL_COMMUNITY)
Admission: RE | Admit: 2023-08-19 | Discharge: 2023-08-19 | Disposition: A | Discharge status: Home / Self Care | Payer: Medicare Other | Source: Ambulatory Visit | Attending: Orthopedic Surgery | Admitting: Orthopedic Surgery

## 2023-08-19 ENCOUNTER — Other Ambulatory Visit: Payer: Self-pay

## 2023-08-19 ENCOUNTER — Encounter (HOSPITAL_COMMUNITY): Payer: Self-pay

## 2023-08-19 VITALS — BP 132/79 | HR 64 | Temp 98.3°F | Resp 16 | Ht 64.0 in | Wt 141.2 lb

## 2023-08-19 DIAGNOSIS — Z01812 Encounter for preprocedural laboratory examination: Secondary | ICD-10-CM | POA: Insufficient documentation

## 2023-08-19 DIAGNOSIS — Z01818 Encounter for other preprocedural examination: Secondary | ICD-10-CM

## 2023-08-19 LAB — CBC
HCT: 34.3 % — ABNORMAL LOW (ref 36.0–46.0)
Hemoglobin: 10.8 g/dL — ABNORMAL LOW (ref 12.0–15.0)
MCH: 28.3 pg (ref 26.0–34.0)
MCHC: 31.5 g/dL (ref 30.0–36.0)
MCV: 89.8 fL (ref 80.0–100.0)
Platelets: 258 10*3/uL (ref 150–400)
RBC: 3.82 MIL/uL — ABNORMAL LOW (ref 3.87–5.11)
RDW: 15.3 % (ref 11.5–15.5)
WBC: 4.2 10*3/uL (ref 4.0–10.5)
nRBC: 0 % (ref 0.0–0.2)

## 2023-08-19 LAB — TYPE AND SCREEN
ABO/RH(D): A POS
Antibody Screen: NEGATIVE

## 2023-08-19 LAB — SURGICAL PCR SCREEN
MRSA, PCR: NEGATIVE
Staphylococcus aureus: NEGATIVE

## 2023-08-19 NOTE — Progress Notes (Addendum)
COVID Vaccine Completed:  Yes  Date of COVID positive in last 90 days:  No  PCP - Tally Joe, MD Cardiologist - N/A  Chest x-ray -  N/A EKG -  N/A Stress Test -  N/A ECHO -  N/A Cardiac Cath -  N/A Pacemaker/ICD device last checked: Spinal Cord Stimulator:  N/A  Bowel Prep - N/A  Sleep Study - N/A CPAP -   Fasting Blood Sugar - N/A Checks Blood Sugar _____ times a day  Last dose of GLP1 agonist-  N/A GLP1 instructions:  Hold 7 days before surgery    Last dose of SGLT-2 inhibitors-  N/A SGLT-2 instructions:  Hold 3 days before surgery    Blood Thinner Instructions:   N/A Aspirin Instructions:  N/A Last Dose:  Activity level:  Can go up a flight of stairs and perform activities of daily living without stopping and without symptoms of chest pain or shortness of breath.  Able to exercise without symptoms  Anesthesia review:  N/A  Patient denies shortness of breath, fever, cough and chest pain at PAT appointment  Patient verbalized understanding of instructions that were given to them at the PAT appointment. Patient was also instructed that they will need to review over the PAT instructions again at home before surgery.

## 2023-08-23 ENCOUNTER — Other Ambulatory Visit: Payer: Self-pay | Admitting: Orthopedic Surgery

## 2023-08-23 DIAGNOSIS — G8929 Other chronic pain: Secondary | ICD-10-CM

## 2023-08-27 DIAGNOSIS — E0590 Thyrotoxicosis, unspecified without thyrotoxic crisis or storm: Secondary | ICD-10-CM | POA: Diagnosis not present

## 2023-08-30 ENCOUNTER — Other Ambulatory Visit: Payer: Self-pay

## 2023-08-30 ENCOUNTER — Inpatient Hospital Stay (HOSPITAL_COMMUNITY)
Admission: RE | Admit: 2023-08-30 | Discharge: 2023-08-31 | DRG: 468 | Disposition: A | Discharge status: Home / Self Care | Payer: Medicare Other | Attending: Orthopedic Surgery | Admitting: Orthopedic Surgery

## 2023-08-30 ENCOUNTER — Encounter (HOSPITAL_COMMUNITY)
Admission: RE | Disposition: A | Discharge status: Home / Self Care | Payer: Self-pay | Source: Home / Self Care | Attending: Orthopedic Surgery

## 2023-08-30 ENCOUNTER — Encounter (HOSPITAL_COMMUNITY): Payer: Self-pay | Admitting: Orthopedic Surgery

## 2023-08-30 ENCOUNTER — Inpatient Hospital Stay (HOSPITAL_COMMUNITY): Payer: Medicare Other | Admitting: Anesthesiology

## 2023-08-30 DIAGNOSIS — E0500 Thyrotoxicosis with diffuse goiter without thyrotoxic crisis or storm: Secondary | ICD-10-CM | POA: Diagnosis present

## 2023-08-30 DIAGNOSIS — I2510 Atherosclerotic heart disease of native coronary artery without angina pectoris: Secondary | ICD-10-CM | POA: Diagnosis not present

## 2023-08-30 DIAGNOSIS — T84012A Broken internal right knee prosthesis, initial encounter: Secondary | ICD-10-CM | POA: Diagnosis not present

## 2023-08-30 DIAGNOSIS — G8929 Other chronic pain: Secondary | ICD-10-CM

## 2023-08-30 DIAGNOSIS — Z85828 Personal history of other malignant neoplasm of skin: Secondary | ICD-10-CM

## 2023-08-30 DIAGNOSIS — T84092A Other mechanical complication of internal right knee prosthesis, initial encounter: Secondary | ICD-10-CM

## 2023-08-30 DIAGNOSIS — Y792 Prosthetic and other implants, materials and accessory orthopedic devices associated with adverse incidents: Secondary | ICD-10-CM | POA: Diagnosis present

## 2023-08-30 DIAGNOSIS — G4700 Insomnia, unspecified: Secondary | ICD-10-CM | POA: Diagnosis present

## 2023-08-30 DIAGNOSIS — Y929 Unspecified place or not applicable: Secondary | ICD-10-CM

## 2023-08-30 DIAGNOSIS — Z96659 Presence of unspecified artificial knee joint: Principal | ICD-10-CM

## 2023-08-30 DIAGNOSIS — M25561 Pain in right knee: Secondary | ICD-10-CM | POA: Diagnosis present

## 2023-08-30 DIAGNOSIS — G8918 Other acute postprocedural pain: Secondary | ICD-10-CM | POA: Diagnosis not present

## 2023-08-30 DIAGNOSIS — Z96651 Presence of right artificial knee joint: Secondary | ICD-10-CM

## 2023-08-30 HISTORY — PX: TOTAL KNEE REVISION: SHX996

## 2023-08-30 LAB — CBC WITH DIFFERENTIAL/PLATELET
Abs Immature Granulocytes: 0.01 10*3/uL (ref 0.00–0.07)
Basophils Absolute: 0 10*3/uL (ref 0.0–0.1)
Basophils Relative: 1 %
Eosinophils Absolute: 0 10*3/uL (ref 0.0–0.5)
Eosinophils Relative: 0 %
HCT: 31.4 % — ABNORMAL LOW (ref 36.0–46.0)
Hemoglobin: 10 g/dL — ABNORMAL LOW (ref 12.0–15.0)
Immature Granulocytes: 0 %
Lymphocytes Relative: 23 %
Lymphs Abs: 0.8 10*3/uL (ref 0.7–4.0)
MCH: 28.8 pg (ref 26.0–34.0)
MCHC: 31.8 g/dL (ref 30.0–36.0)
MCV: 90.5 fL (ref 80.0–100.0)
Monocytes Absolute: 0.2 10*3/uL (ref 0.1–1.0)
Monocytes Relative: 7 %
Neutro Abs: 2.5 10*3/uL (ref 1.7–7.7)
Neutrophils Relative %: 69 %
Platelets: 244 10*3/uL (ref 150–400)
RBC: 3.47 MIL/uL — ABNORMAL LOW (ref 3.87–5.11)
RDW: 15.1 % (ref 11.5–15.5)
WBC: 3.6 10*3/uL — ABNORMAL LOW (ref 4.0–10.5)
nRBC: 0 % (ref 0.0–0.2)

## 2023-08-30 LAB — COMPREHENSIVE METABOLIC PANEL
ALT: 11 U/L (ref 0–44)
AST: 17 U/L (ref 15–41)
Albumin: 4 g/dL (ref 3.5–5.0)
Alkaline Phosphatase: 39 U/L (ref 38–126)
Anion gap: 7 (ref 5–15)
BUN: 20 mg/dL (ref 8–23)
CO2: 26 mmol/L (ref 22–32)
Calcium: 8.7 mg/dL — ABNORMAL LOW (ref 8.9–10.3)
Chloride: 102 mmol/L (ref 98–111)
Creatinine, Ser: 1.2 mg/dL — ABNORMAL HIGH (ref 0.44–1.00)
GFR, Estimated: 48 mL/min — ABNORMAL LOW (ref >60)
Glucose, Bld: 81 mg/dL (ref 70–99)
Potassium: 4 mmol/L (ref 3.5–5.1)
Sodium: 135 mmol/L (ref 135–145)
Total Bilirubin: 0.5 mg/dL (ref 0.0–1.2)
Total Protein: 6.8 g/dL (ref 6.5–8.1)

## 2023-08-30 SURGERY — TOTAL KNEE REVISION
Anesthesia: Spinal | Site: Knee | Laterality: Right

## 2023-08-30 MED ORDER — OXYCODONE HCL 5 MG PO TABS: 5.0000 mg | ORAL_TABLET | Freq: Four times a day (QID) | ORAL | 0 refills | Status: AC | PRN

## 2023-08-30 MED ORDER — POVIDONE-IODINE 10 % EX SWAB
2.0000 | Freq: Once | CUTANEOUS | Status: AC
  Administered 2023-08-30: 2 via TOPICAL

## 2023-08-30 MED ORDER — LACTATED RINGERS IV BOLUS: 250.0000 mL | Freq: Once | INTRAVENOUS | Status: DC

## 2023-08-30 MED ORDER — BISACODYL 5 MG PO TBEC: 5.0000 mg | DELAYED_RELEASE_TABLET | Freq: Every day | ORAL | Status: DC | PRN

## 2023-08-30 MED ORDER — ASPIRIN 81 MG PO TBEC: 81.0000 mg | DELAYED_RELEASE_TABLET | Freq: Two times a day (BID) | ORAL | 0 refills | Status: AC

## 2023-08-30 MED ORDER — GLYCOPYRROLATE 0.2 MG/ML IJ SOLN
INTRAMUSCULAR | Status: AC
  Filled 2023-08-30: qty 1

## 2023-08-30 MED ORDER — ONDANSETRON HCL 4 MG/2ML IJ SOLN
INTRAMUSCULAR | Status: AC
  Filled 2023-08-30: qty 2

## 2023-08-30 MED ORDER — PROPOFOL 10 MG/ML IV BOLUS
INTRAVENOUS | Status: DC | PRN
  Administered 2023-08-30: 10 mg via INTRAVENOUS
  Administered 2023-08-30: 15 mg via INTRAVENOUS

## 2023-08-30 MED ORDER — MENTHOL 3 MG MT LOZG: 1.0000 | LOZENGE | OROMUCOSAL | Status: DC | PRN

## 2023-08-30 MED ORDER — ACETAMINOPHEN 500 MG PO TABS
1000.0000 mg | ORAL_TABLET | Freq: Once | ORAL | Status: AC
  Administered 2023-08-30: 1000 mg via ORAL
  Filled 2023-08-30: qty 2

## 2023-08-30 MED ORDER — SODIUM CHLORIDE (PF) 0.9 % IJ SOLN
INTRAMUSCULAR | Status: AC
  Filled 2023-08-30: qty 20

## 2023-08-30 MED ORDER — TRANEXAMIC ACID-NACL 1000-0.7 MG/100ML-% IV SOLN
1000.0000 mg | INTRAVENOUS | Status: AC
  Administered 2023-08-30: 1000 mg via INTRAVENOUS
  Filled 2023-08-30: qty 100

## 2023-08-30 MED ORDER — DOCUSATE SODIUM 100 MG PO CAPS
100.0000 mg | ORAL_CAPSULE | Freq: Two times a day (BID) | ORAL | Status: DC
  Administered 2023-08-30 – 2023-08-31 (×2): 100 mg via ORAL
  Filled 2023-08-30 (×2): qty 1

## 2023-08-30 MED ORDER — SODIUM CHLORIDE 0.9 % IR SOLN
Status: DC | PRN
  Administered 2023-08-30: 1

## 2023-08-30 MED ORDER — BUPIVACAINE-EPINEPHRINE (PF) 0.25% -1:200000 IJ SOLN
INTRAMUSCULAR | Status: DC | PRN
  Administered 2023-08-30: 30 mL

## 2023-08-30 MED ORDER — BUPIVACAINE LIPOSOME 1.3 % IJ SUSP: 20.0000 mL | Freq: Once | INTRAMUSCULAR | Status: DC

## 2023-08-30 MED ORDER — MIDAZOLAM HCL 2 MG/2ML IJ SOLN: 2.0000 mg | INTRAMUSCULAR | Status: DC

## 2023-08-30 MED ORDER — BUPIVACAINE LIPOSOME 1.3 % IJ SUSP
INTRAMUSCULAR | Status: AC
  Filled 2023-08-30: qty 20

## 2023-08-30 MED ORDER — METHOCARBAMOL 500 MG PO TABS
ORAL_TABLET | ORAL | Status: AC
  Administered 2023-08-30: 500 mg via ORAL
  Filled 2023-08-30: qty 1

## 2023-08-30 MED ORDER — ALUM & MAG HYDROXIDE-SIMETH 200-200-20 MG/5ML PO SUSP: 30.0000 mL | ORAL | Status: DC | PRN

## 2023-08-30 MED ORDER — BUPIVACAINE LIPOSOME 1.3 % IJ SUSP
INTRAMUSCULAR | Status: DC | PRN
  Administered 2023-08-30: 20 mL

## 2023-08-30 MED ORDER — BUPIVACAINE-EPINEPHRINE 0.25% -1:200000 IJ SOLN
INTRAMUSCULAR | Status: AC
  Filled 2023-08-30: qty 1

## 2023-08-30 MED ORDER — DEXAMETHASONE SODIUM PHOSPHATE 10 MG/ML IJ SOLN
INTRAMUSCULAR | Status: DC | PRN
  Administered 2023-08-30: 10 mg

## 2023-08-30 MED ORDER — FENTANYL CITRATE PF 50 MCG/ML IJ SOSY
100.0000 ug | PREFILLED_SYRINGE | INTRAMUSCULAR | Status: DC
  Administered 2023-08-30: 50 ug via INTRAVENOUS
  Filled 2023-08-30: qty 2

## 2023-08-30 MED ORDER — DEXAMETHASONE SODIUM PHOSPHATE 10 MG/ML IJ SOLN
INTRAMUSCULAR | Status: AC
  Filled 2023-08-30: qty 1

## 2023-08-30 MED ORDER — GLYCOPYRROLATE 0.2 MG/ML IJ SOLN
INTRAMUSCULAR | Status: DC | PRN
  Administered 2023-08-30: .2 mg via INTRAVENOUS

## 2023-08-30 MED ORDER — OXYCODONE HCL 5 MG PO TABS
5.0000 mg | ORAL_TABLET | ORAL | Status: DC | PRN
Start: 2023-08-30 — End: 2023-08-31
  Administered 2023-08-31 (×2): 5 mg via ORAL
  Filled 2023-08-30 (×2): qty 1

## 2023-08-30 MED ORDER — OXYCODONE HCL 5 MG PO TABS
ORAL_TABLET | ORAL | Status: AC
  Administered 2023-08-30: 5 mg via ORAL
  Filled 2023-08-30: qty 1

## 2023-08-30 MED ORDER — ACETAMINOPHEN 500 MG PO TABS
1000.0000 mg | ORAL_TABLET | Freq: Four times a day (QID) | ORAL | Status: DC
  Administered 2023-08-30 – 2023-08-31 (×3): 1000 mg via ORAL
  Filled 2023-08-30 (×3): qty 2

## 2023-08-30 MED ORDER — METHOCARBAMOL 1000 MG/10ML IJ SOLN: 500.0000 mg | Freq: Four times a day (QID) | INTRAMUSCULAR | Status: DC | PRN

## 2023-08-30 MED ORDER — ELETRIPTAN HYDROBROMIDE 40 MG PO TABS: 40.0000 mg | ORAL_TABLET | ORAL | Status: DC | PRN

## 2023-08-30 MED ORDER — METHOCARBAMOL 500 MG PO TABS: 500.0000 mg | ORAL_TABLET | Freq: Four times a day (QID) | ORAL | 0 refills | Status: AC

## 2023-08-30 MED ORDER — BUPIVACAINE IN DEXTROSE 0.75-8.25 % IT SOLN
INTRATHECAL | Status: DC | PRN
  Administered 2023-08-30: 1.6 mL via INTRATHECAL

## 2023-08-30 MED ORDER — SODIUM CHLORIDE 0.9 % IV SOLN: INTRAVENOUS | Status: DC

## 2023-08-30 MED ORDER — FENTANYL CITRATE PF 50 MCG/ML IJ SOSY: 25.0000 ug | PREFILLED_SYRINGE | INTRAMUSCULAR | Status: DC | PRN

## 2023-08-30 MED ORDER — SODIUM CHLORIDE 0.9% FLUSH
INTRAVENOUS | Status: DC | PRN
  Administered 2023-08-30: 20 mL

## 2023-08-30 MED ORDER — DIPHENHYDRAMINE HCL 12.5 MG/5ML PO ELIX: 12.5000 mg | ORAL_SOLUTION | ORAL | Status: DC | PRN

## 2023-08-30 MED ORDER — ONDANSETRON HCL 4 MG/2ML IJ SOLN
INTRAMUSCULAR | Status: DC | PRN
  Administered 2023-08-30: 4 mg via INTRAVENOUS

## 2023-08-30 MED ORDER — ASPIRIN 81 MG PO CHEW
81.0000 mg | CHEWABLE_TABLET | Freq: Two times a day (BID) | ORAL | Status: DC
  Administered 2023-08-30 – 2023-08-31 (×2): 81 mg via ORAL
  Filled 2023-08-30 (×2): qty 1

## 2023-08-30 MED ORDER — ONDANSETRON HCL 4 MG/2ML IJ SOLN: 4.0000 mg | Freq: Four times a day (QID) | INTRAMUSCULAR | Status: DC | PRN

## 2023-08-30 MED ORDER — DEXAMETHASONE SODIUM PHOSPHATE 10 MG/ML IJ SOLN
8.0000 mg | Freq: Once | INTRAMUSCULAR | Status: AC
  Administered 2023-08-30: 8 mg via INTRAVENOUS

## 2023-08-30 MED ORDER — FLEET ENEMA RE ENEM: 1.0000 | ENEMA | Freq: Once | RECTAL | Status: DC | PRN

## 2023-08-30 MED ORDER — LACTATED RINGERS IV SOLN: INTRAVENOUS | Status: DC

## 2023-08-30 MED ORDER — ONDANSETRON HCL 4 MG/2ML IJ SOLN: 4.0000 mg | Freq: Once | INTRAMUSCULAR | Status: DC | PRN

## 2023-08-30 MED ORDER — ORAL CARE MOUTH RINSE: 15.0000 mL | Freq: Once | OROMUCOSAL | Status: AC

## 2023-08-30 MED ORDER — LACTATED RINGERS IV BOLUS
500.0000 mL | Freq: Once | INTRAVENOUS | Status: DC
Start: 2023-08-30 — End: 2023-08-31

## 2023-08-30 MED ORDER — ROPIVACAINE HCL 5 MG/ML IJ SOLN
INTRAMUSCULAR | Status: DC | PRN
  Administered 2023-08-30: 20 mL via PERINEURAL

## 2023-08-30 MED ORDER — ESTRADIOL 0.5 MG PO TABS
2.0000 mg | ORAL_TABLET | Freq: Every day | ORAL | Status: DC
  Administered 2023-08-31: 2 mg via ORAL
  Filled 2023-08-30: qty 4

## 2023-08-30 MED ORDER — METHOCARBAMOL 500 MG PO TABS
500.0000 mg | ORAL_TABLET | Freq: Four times a day (QID) | ORAL | Status: DC | PRN
  Administered 2023-08-30 – 2023-08-31 (×2): 500 mg via ORAL
  Filled 2023-08-30 (×2): qty 1

## 2023-08-30 MED ORDER — GABAPENTIN 300 MG PO CAPS
300.0000 mg | ORAL_CAPSULE | Freq: Once | ORAL | Status: AC
  Administered 2023-08-30: 300 mg via ORAL
  Filled 2023-08-30: qty 1

## 2023-08-30 MED ORDER — LORAZEPAM 1 MG PO TABS
1.0000 mg | ORAL_TABLET | Freq: Every evening | ORAL | Status: DC | PRN
  Administered 2023-08-30: 1 mg via ORAL
  Filled 2023-08-30: qty 1

## 2023-08-30 MED ORDER — METHIMAZOLE 5 MG PO TABS: 10.0000 mg | ORAL_TABLET | ORAL | Status: DC

## 2023-08-30 MED ORDER — 0.9 % SODIUM CHLORIDE (POUR BTL) OPTIME: TOPICAL | Status: DC | PRN

## 2023-08-30 MED ORDER — HYDROMORPHONE HCL 1 MG/ML IJ SOLN: 0.5000 mg | INTRAMUSCULAR | Status: DC | PRN

## 2023-08-30 MED ORDER — PANTOPRAZOLE SODIUM 40 MG PO TBEC
40.0000 mg | DELAYED_RELEASE_TABLET | Freq: Every day | ORAL | Status: DC
  Administered 2023-08-31: 40 mg via ORAL
  Filled 2023-08-30: qty 1

## 2023-08-30 MED ORDER — PHENYLEPHRINE HCL-NACL 20-0.9 MG/250ML-% IV SOLN
INTRAVENOUS | Status: DC | PRN
  Administered 2023-08-30: 50 ug/min via INTRAVENOUS

## 2023-08-30 MED ORDER — PHENYLEPHRINE HCL (PRESSORS) 10 MG/ML IV SOLN
INTRAVENOUS | Status: AC
  Filled 2023-08-30: qty 1

## 2023-08-30 MED ORDER — ONDANSETRON HCL 4 MG PO TABS: 4.0000 mg | ORAL_TABLET | Freq: Four times a day (QID) | ORAL | Status: DC | PRN

## 2023-08-30 MED ORDER — ATENOLOL 50 MG PO TABS: 25.0000 mg | ORAL_TABLET | ORAL | Status: DC

## 2023-08-30 MED ORDER — PROPOFOL 1000 MG/100ML IV EMUL
INTRAVENOUS | Status: AC
  Filled 2023-08-30: qty 100

## 2023-08-30 MED ORDER — DEXAMETHASONE SODIUM PHOSPHATE 10 MG/ML IJ SOLN
10.0000 mg | Freq: Once | INTRAMUSCULAR | Status: DC
  Filled 2023-08-30: qty 1

## 2023-08-30 MED ORDER — PHENOL 1.4 % MT LIQD: 1.0000 | OROMUCOSAL | Status: DC | PRN

## 2023-08-30 MED ORDER — METOCLOPRAMIDE HCL 5 MG PO TABS: 5.0000 mg | ORAL_TABLET | Freq: Three times a day (TID) | ORAL | Status: DC | PRN

## 2023-08-30 MED ORDER — SENNOSIDES-DOCUSATE SODIUM 8.6-50 MG PO TABS: 1.0000 | ORAL_TABLET | Freq: Every evening | ORAL | Status: DC | PRN

## 2023-08-30 MED ORDER — PROPOFOL 500 MG/50ML IV EMUL
INTRAVENOUS | Status: DC | PRN
  Administered 2023-08-30: 60 ug/kg/min via INTRAVENOUS

## 2023-08-30 MED ORDER — FERROUS SULFATE 325 (65 FE) MG PO TABS
325.0000 mg | ORAL_TABLET | Freq: Three times a day (TID) | ORAL | Status: DC
Start: 2023-08-31 — End: 2023-08-31
  Administered 2023-08-31: 325 mg via ORAL
  Filled 2023-08-30: qty 1

## 2023-08-30 MED ORDER — METOCLOPRAMIDE HCL 5 MG/ML IJ SOLN: 5.0000 mg | Freq: Three times a day (TID) | INTRAMUSCULAR | Status: DC | PRN

## 2023-08-30 MED ORDER — CHLORHEXIDINE GLUCONATE 0.12 % MT SOLN
15.0000 mL | Freq: Once | OROMUCOSAL | Status: AC
  Administered 2023-08-30: 15 mL via OROMUCOSAL

## 2023-08-30 MED ORDER — CEFAZOLIN SODIUM-DEXTROSE 2-4 GM/100ML-% IV SOLN
2.0000 g | INTRAVENOUS | Status: AC
  Administered 2023-08-30: 2 g via INTRAVENOUS
  Filled 2023-08-30: qty 100

## 2023-08-30 SURGICAL SUPPLY — 48 items
BAG COUNTER SPONGE SURGICOUNT (BAG) IMPLANT
BAG ZIPLOCK 12X15 (MISCELLANEOUS) ×1 IMPLANT
BLADE SAGITTAL 13X1.27X60 (BLADE) IMPLANT
BLADE SAW SGTL 81X20 HD (BLADE) IMPLANT
BLADE SAW SGTL 83.5X18.5 (BLADE) IMPLANT
BLADE SURG 15 STRL LF DISP TIS (BLADE) IMPLANT
BLADE SURG SZ10 CARB STEEL (BLADE) ×4 IMPLANT
BNDG ELASTIC 6INX 5YD STR LF (GAUZE/BANDAGES/DRESSINGS) IMPLANT
BNDG ELASTIC 6X15 VLCR STRL LF (GAUZE/BANDAGES/DRESSINGS) ×1 IMPLANT
BOWL SMART MIX CTS (DISPOSABLE) IMPLANT
COVER SURGICAL LIGHT HANDLE (MISCELLANEOUS) ×1 IMPLANT
CUFF TRNQT CYL 34X4.125X (TOURNIQUET CUFF) IMPLANT
DRAPE INCISE IOBAN 66X45 STRL (DRAPES) ×2 IMPLANT
DRAPE U-SHAPE 47X51 STRL (DRAPES) ×1 IMPLANT
DRSG AQUACEL AG ADV 3.5X10 (GAUZE/BANDAGES/DRESSINGS) ×1 IMPLANT
DURAPREP 26ML APPLICATOR (WOUND CARE) ×2 IMPLANT
ELECT REM PT RETURN 15FT ADLT (MISCELLANEOUS) ×1 IMPLANT
GLOVE BIOGEL M 7.0 STRL (GLOVE) IMPLANT
GLOVE BIOGEL PI IND STRL 7.5 (GLOVE) IMPLANT
GLOVE BIOGEL PI IND STRL 8.5 (GLOVE) ×1 IMPLANT
GLOVE SURG LX STRL 7.5 STRW (GLOVE) IMPLANT
GLOVE SURG ORTHO 8.0 STRL STRW (GLOVE) ×2 IMPLANT
GOWN STRL REUS W/ TWL XL LVL3 (GOWN DISPOSABLE) ×2 IMPLANT
HOLDER FOLEY CATH W/STRAP (MISCELLANEOUS) IMPLANT
HOOD PEEL AWAY T7 (MISCELLANEOUS) ×3 IMPLANT
INSERT TIB ATTUNE FB SZ5X6 (Insert) IMPLANT
KIT TURNOVER KIT A (KITS) IMPLANT
MANIFOLD NEPTUNE II (INSTRUMENTS) ×1 IMPLANT
NDL HYPO 22X1.5 SAFETY MO (MISCELLANEOUS) ×1 IMPLANT
NEEDLE HYPO 22X1.5 SAFETY MO (MISCELLANEOUS) ×1 IMPLANT
NS IRRIG 1000ML POUR BTL (IV SOLUTION) ×1 IMPLANT
PACK TOTAL KNEE CUSTOM (KITS) ×1 IMPLANT
PROTECTOR NERVE ULNAR (MISCELLANEOUS) ×1 IMPLANT
SET HNDPC FAN SPRY TIP SCT (DISPOSABLE) ×1 IMPLANT
SPIKE FLUID TRANSFER (MISCELLANEOUS) IMPLANT
STAPLER SKIN PROX WIDE 3.9 (STAPLE) IMPLANT
STRIP CLOSURE SKIN 1/2X4 (GAUZE/BANDAGES/DRESSINGS) ×1 IMPLANT
SUT BONE WAX W31G (SUTURE) ×1 IMPLANT
SUT MNCRL AB 3-0 PS2 18 (SUTURE) ×1 IMPLANT
SUT STRATAFIX 0 PDS 27 VIOLET (SUTURE) ×1
SUT STRATAFIX PDS+ 0 24IN (SUTURE) ×1 IMPLANT
SUT VIC AB 1 CT1 36 (SUTURE) ×1 IMPLANT
SUT VIC AB 2-0 CT1 TAPERPNT 27 (SUTURE) IMPLANT
SUTURE STRATFX 0 PDS 27 VIOLET (SUTURE) ×1 IMPLANT
TRAY FOLEY MTR SLVR 16FR STAT (SET/KITS/TRAYS/PACK) ×1 IMPLANT
TUBE SUCTION HIGH CAP CLEAR NV (SUCTIONS) IMPLANT
WATER STERILE IRR 1000ML POUR (IV SOLUTION) ×1 IMPLANT
WRAP KNEE MAXI GEL POST OP (GAUZE/BANDAGES/DRESSINGS) IMPLANT

## 2023-08-30 NOTE — Anesthesia Procedure Notes (Signed)
 Spinal  Patient location during procedure: OR Start time: 08/30/2023 10:31 AM End time: 08/30/2023 10:33 AM Staffing Performed: resident/CRNA  Anesthesiologist: Corinne Garnette BRAVO, MD Resident/CRNA: Carleton Garnette SAUNDERS, CRNA Performed by: Carleton Garnette SAUNDERS, CRNA Authorized by: Corinne Garnette BRAVO, MD   Preanesthetic Checklist Completed: patient identified, IV checked, site marked, risks and benefits discussed, surgical consent, monitors and equipment checked, pre-op evaluation and timeout performed Spinal Block Patient position: sitting Prep: DuraPrep Patient monitoring: cardiac monitor, heart rate, continuous pulse ox and blood pressure Approach: midline Location: L3-4 Needle Needle type: Pencan  Needle gauge: 24 G Needle length: 10 cm Needle insertion depth: 7 cm Assessment Sensory level: T6 Events: CSF return Additional Notes Timeout performed. Patient in sitting position. L3-4 identified. Cleansed with Duraprep. SAB without difficulty. To supine position.

## 2023-08-30 NOTE — Anesthesia Procedure Notes (Signed)
 Anesthesia Regional Block: Adductor canal block   Pre-Anesthetic Checklist: , timeout performed,  Correct Patient, Correct Site, Correct Laterality,  Correct Procedure, Correct Position, site marked,  Risks and benefits discussed,  Surgical consent,  Pre-op evaluation,  At surgeon's request and post-op pain management  Laterality: Right  Prep: chloraprep       Needles:  Injection technique: Single-shot  Needle Type: Echogenic Needle     Needle Length: 9cm  Needle Gauge: 21     Additional Needles:   Procedures:,,,, ultrasound used (permanent image in chart),,    Narrative:  Start time: 08/30/2023 9:40 AM End time: 08/30/2023 9:50 AM Injection made incrementally with aspirations every 5 mL.  Performed by: Personally  Anesthesiologist: Corinne Garnette BRAVO, MD  Additional Notes: No pain on injection. No increased resistance to injection. Injection made in 5cc increments.  Good needle visualization.  Patient tolerated procedure well.

## 2023-08-30 NOTE — H&P (Signed)
 STUTI SANDIN MRN:  [id number] DOB/SEX:  [birth year]/female  CHIEF COMPLAINT:  Painful right Knee  HISTORY: Patient is a 73 y.o. female presented with a history of pain in the right knee. Onset of symptoms was gradual starting a few years ago with unchanged course since that time. Patient has been treated conservatively with over-the-counter NSAIDs and activity modification. Patient currently rates pain in the knee at 10 out of 10 with activity. There is pain at night.  PAST MEDICAL HISTORY: Patient Active Problem List   Diagnosis Date Noted   Status post total right knee replacement 05/30/2021   Unilateral primary osteoarthritis, right knee 05/29/2021   Unilateral primary osteoarthritis, left knee 04/01/2021   Acute appendicitis 01/28/2021   Hyperthyroidism 04/14/2020   Heterozygous factor V Leiden mutation (HCC) 09/23/2015   Lupus anticoagulant positive 09/23/2015   Varicose veins of both lower extremities 09/23/2015   Thrombophlebitis of superficial veins of right lower extremity 04/24/2014   Past Medical History:  Diagnosis Date   Arthritis    Back pain    Cancer (HCC)    hx of skin cancer   Graves disease    Hyperthyroidism    Insomnia    Migraines    Past Surgical History:  Procedure Laterality Date   ABDOMINAL HYSTERECTOMY     ELBOW FRACTURE SURGERY     LAPAROSCOPIC APPENDECTOMY N/A 01/29/2021   Procedure: APPENDECTOMY LAPAROSCOPIC;  Surgeon: Signe Mitzie LABOR, MD;  Location: [facility];  Service: General;  Laterality: N/A;   TONSILLECTOMY     TOTAL KNEE ARTHROPLASTY Right 05/30/2021   Procedure: RIGHT TOTAL KNEE ARTHROPLASTY;  Surgeon: Vernetta Lonni GRADE, MD;  Location: [facility];  Service: Orthopedics;  Laterality: Right;   TYMPANOSTOMY TUBE PLACEMENT       MEDICATIONS:   No medications prior to admission.    ALLERGIES:  No Known Allergies  REVIEW OF SYSTEMS:  A comprehensive review of systems was negative except for: Musculoskeletal: positive for arthralgias and  bone pain   FAMILY HISTORY:   Family History  Problem Relation Age of Onset   Thyroid  cancer Sister     SOCIAL HISTORY:   Social History   Tobacco Use   Smoking status: Never   Smokeless tobacco: Never  Substance Use Topics   Alcohol use: Yes    Comment: soc     EXAMINATION:  Vital signs in last 24 hours:    There were no vitals taken for this visit.  General Appearance:    Alert, cooperative, no distress, appears stated age  Head:    Normocephalic, without obvious abnormality, atraumatic  Eyes:    PERRL, conjunctiva/corneas clear, EOM's intact, fundi    benign, both eyes  Ears:    Normal TM's and external ear canals, both ears  Nose:   Nares normal, septum midline, mucosa normal, no drainage    or sinus tenderness  Throat:   Lips, mucosa, and tongue normal; teeth and gums normal  Neck:   Supple, symmetrical, trachea midline, no adenopathy;    thyroid :  no enlargement/tenderness/nodules; no carotid   bruit or JVD  Back:     Symmetric, no curvature, ROM normal, no CVA tenderness  Lungs:     Clear to auscultation bilaterally, respirations unlabored  Chest Wall:    No tenderness or deformity   Heart:    Regular rate and rhythm, S1 and S2 normal, no murmur, rub   or gallop  Breast Exam:    No tenderness, masses, or nipple abnormality  Abdomen:  Soft, non-tender, bowel sounds active all four quadrants,    no masses, no organomegaly  Genitalia:    Normal female without lesion, discharge or tenderness  Rectal:    Normal tone, no masses or tenderness;   guaiac negative stool  Extremities:   Extremities normal, atraumatic, no cyanosis or edema  Pulses:   2+ and symmetric all extremities  Skin:   Skin color, texture, turgor normal, no rashes or lesions  Lymph nodes:   Cervical, supraclavicular, and axillary nodes normal  Neurologic:   CNII-XII intact, normal strength, sensation and reflexes    throughout    Musculoskeletal:  ROM 0-100, Ligaments intact, extremely tight  in gap balance Imaging Review Plain radiographs demonstrate TKA of the right knee. The overall alignment is neutral. The bone quality appears to be good for age and reported activity level.  Assessment/Plan: Failed TKA right knee  The patient history, physical examination and imaging studies are consistent with failed total knee of the right knee. The patient has failed conservative treatment.  The clearance notes were reviewed.  After discussion with the patient it was felt that opem synovectomy and poly exchange was indicated. The procedure,  risks, and benefits  were presented and reviewed. The risks including but not limited to aseptic loosening, infection, blood clots, vascular injury, stiffness, patella tracking problems complications among others were discussed. The patient acknowledged the explanation, agreed to proceed with the plan.  Preoperative templating of the joint replacement has been completed, documented, and submitted to the Operating Room personnel in order to optimize intra-operative equipment management.    Patient's anticipated LOS is less than 2 midnights, meeting these requirements: - Lives within 1 hour of care - Has a competent adult at home to recover with post-op recover - NO history of  - Chronic pain requiring opiods  - Diabetes  - Coronary Artery Disease  - Heart failure  - Heart attack  - Stroke  - DVT/VTE  - Cardiac arrhythmia  - Respiratory Failure/COPD  - Renal failure  - Anemia  - Advanced Liver disease     Laurell Dale Simpers 08/30/2023, 7:06 AM

## 2023-08-30 NOTE — Evaluation (Signed)
 Physical Therapy Evaluation Patient Details Name: Kathleen Peters MRN: [id number] DOB: Jun 22, 1951 Today's Date: 08/30/2023  History of Present Illness  73 yo female presents to therapy s/p R TKA revision on 08/30/2023 due to failure of conservative measures. Pt PMH includes but is not limited to: R TKA (2022),  L TKA (2024). hyperthyroidism, heterozygous gactor V leiden mutation, LBP, graves dz, and elbow fx repair.  Clinical Impression   Kathleen Peters is a 73 y.o. female POD 0 s/p R TKA. Patient reports IND with mobility at baseline. Patient is now limited by functional impairments (see PT problem list below). Patient instructed in exercise to facilitate ROM and circulation to manage edema, pt unable to perform quad set, SAQ nor SLR due to slow regression of anesthesia. Pt limited and PT to return later in the day to reassess pt readiness for same day d/c. Patient will benefit from continued skilled PT interventions to address impairments and progress towards PLOF. Acute PT will follow to progress mobility and stair training in preparation for safe discharge home with family support OPPT services scheduled for 1/16.       If plan is discharge home, recommend the following: A little help with walking and/or transfers;A little help with bathing/dressing/bathroom;Assistance with cooking/housework;Assist for transportation;Help with stairs or ramp for entrance   Can travel by private vehicle        Equipment Recommendations None recommended by PT  Recommendations for Other Services       Functional Status Assessment Patient has had a recent decline in their functional status and demonstrates the ability to make significant improvements in function in a reasonable and predictable amount of time.     Precautions / Restrictions Precautions Precautions: Knee;Fall Restrictions Weight Bearing Restrictions Per Provider Order: No      Mobility  Bed Mobility                     Transfers                        Ambulation/Gait                  Stairs            Wheelchair Mobility     Tilt Bed    Modified Rankin (Stroke Patients Only)       Balance                                             Pertinent Vitals/Pain Pain Assessment Pain Assessment: 0-10 Pain Score: 0-No pain Pain Location: R knee Pain Intervention(s): Limited activity within patient's tolerance, Monitored during session, Premedicated before session, Repositioned    Home Living Family/patient expects to be discharged to:: Private residence Living Arrangements: Alone Available Help at Discharge: Family (sister will help at home) Type of Home: House Home Access: Stairs to enter Entrance Stairs-Rails: Left Entrance Stairs-Number of Steps: 3   Home Layout: One level Home Equipment: Agricultural Consultant (2 wheels);Cane - single point      Prior Function Prior Level of Function : Independent/Modified Independent             Mobility Comments: IND with all ADLs, self care tasks and IADLs, no AD       Extremity/Trunk Assessment        Lower Extremity Assessment Lower Extremity  Assessment: RLE deficits/detail RLE Deficits / Details: ankle DF.PF 5/5; SLR PROM absent quad motor control RLE Sensation: decreased light touch    Cervical / Trunk Assessment Cervical / Trunk Assessment: Normal  Communication   Communication Communication: No apparent difficulties  Cognition Arousal: Alert Behavior During Therapy: WFL for tasks assessed/performed Overall Cognitive Status: Within Functional Limits for tasks assessed                                          General Comments      Exercises Total Joint Exercises Ankle Circles/Pumps: AROM, Both, 10 reps Quad Sets: AAROM, Right, 5 reps Short Arc Quad: PROM, Right, 5 reps Heel Slides: AROM, Right, 5 reps Hip ABduction/ADduction: AAROM, Right, 5 reps Straight Leg  Raises: PROM, Right, 5 reps   Assessment/Plan    PT Assessment Patient needs continued PT services  PT Problem List Decreased strength;Decreased range of motion;Decreased activity tolerance;Decreased balance;Decreased mobility;Decreased coordination;Pain       PT Treatment Interventions DME instruction;Gait training;Stair training;Functional mobility training;Therapeutic activities;Therapeutic exercise;Balance training;Neuromuscular re-education;Patient/family education;Modalities    PT Goals (Current goals can be found in the Care Plan section)  Acute Rehab PT Goals Patient Stated Goal: to be able to walk for fitness > 2 miles and return to golf PT Goal Formulation: With patient Time For Goal Achievement: 09/13/23 Potential to Achieve Goals: Good    Frequency 7X/week     Co-evaluation               AM-PAC PT 6 Clicks Mobility  Outcome Measure Help needed turning from your back to your side while in a flat bed without using bedrails?: A Little Help needed moving from lying on your back to sitting on the side of a flat bed without using bedrails?: Total Help needed moving to and from a bed to a chair (including a wheelchair)?: Total Help needed standing up from a chair using your arms (e.g., wheelchair or bedside chair)?: Total Help needed to walk in hospital room?: Total Help needed climbing 3-5 steps with a railing? : Total 6 Click Score: 8    End of Session   Activity Tolerance: Other (comment) (pt limited due to slow regresion of anethesia impacing R LE motor controll and coordination) Patient left: in bed;with call bell/phone within reach;with family/visitor present Nurse Communication: Mobility status;Other (comment) (pt is not ready for same day d/c at this time) PT Visit Diagnosis: Unsteadiness on feet (R26.81);Other abnormalities of gait and mobility (R26.89);Muscle weakness (generalized) (M62.81);Difficulty in walking, not elsewhere classified  (R26.2);Pain Pain - Right/Left: Right Pain - part of body: Knee;Leg    Time: 8556-8491 PT Time Calculation (min) (ACUTE ONLY): 25 min   Charges:   PT Evaluation $PT Eval Low Complexity: 1 Low PT Treatments $Therapeutic Exercise: 8-22 mins PT General Charges $$ ACUTE PT VISIT: 1 Visit         [place], PT Acute Rehab   [place] VEAR Drone 08/30/2023, 5:29 PM

## 2023-08-30 NOTE — Op Note (Signed)
 TOTAL KNEE REPLACEMENT OPERATIVE NOTE:  08/30/2023  11:24 AM  PATIENT:  Kathleen Peters  73 y.o. female  PRE-OPERATIVE DIAGNOSIS:  Right knee failed TKA  T84.018A  POST-OPERATIVE DIAGNOSIS:  Right knee failed TKA T84.018A  PROCEDURE:  Procedure(s): Left Knee Synovectomy and Polyethelyne Exchange  SURGEON:  Surgeon(s): Rubie Kemps, MD  PHYSICIAN ASSISTANT: Almeda Rummer, PA-C  ANESTHESIA:   spinal  SPECIMEN: None  COUNTS:  Correct  TOURNIQUET:   Total Tourniquet Time Documented: Thigh (Right) - 13 minutes Total: Thigh (Right) - 13 minutes   DICTATION:  Indication for procedure:    The patient is a 73 y.o. female who has failed conservative treatment for Right knee failed TKA  T84.018A.  Informed consent was obtained prior to anesthesia. The risks versus benefits of the operation were explain and in a way the patient can, and did, understand.    Description of procedure:     The patient was taken to the operating room and placed under anesthesia.  The patient was positioned in the usual fashion taking care that all body parts were adequately padded and/or protected.  A tourniquet was applied and the leg prepped and draped in the usual sterile fashion.  The extremity was exsanguinated with the esmarch and tourniquet inflated to 250 mmHg.  Pre-operative range of motion was normal. The knee was not balanced appropriately   A homan retractor was place to retract and protect the patella and lateral structures.  A Z-retractor was place medially to protect the medial structures.  All 3 components were debrided and found to be not loose. A large flat osteotome was used to remove to depuy attune fixed bearing 7 mm poly.      A trial 6 mm spacer blocked was found to offer good flexion and extension gap balance after minimal in degree releasing.      I had excellent flexion/extension gap balance, excellent patella tracking.  Flexion was past 90 degrees with a tight quad; extension was  zero.  These components were chosen and the staff opened them to me on the back table while the knee was lavaged copiously.     The soft tissue was infiltrated with 60cc of exparel  1.3% through a 21 gauge needle.   The polyethylene tibial component was snapped into place and the knee placed in extension.  The capsule was infilltrated with a 60cc exparel /marcaine /saline mixture.   the tourniquet was let down.  Hemostasis was obtained.  The arthrotomy was closed using a #1 stratofix running suture.  The deep soft tissues were closed with #0 vicryls and the subcuticular layer closed with #2-0 vicryl.  The skin was reapproximated and closed with staples.  The wound was covered with steristrips, aquacel dressing, and a TED stocking.   The patient was then awakened, extubated, and taken to the recovery room in stable condition.  BLOOD LOSS:  100cc COMPLICATIONS:  None.  PLAN OF CARE: Discharge to home after PACU  PATIENT DISPOSITION:  PACU - hemodynamically stable.   Delay start of Pharmacological VTE agent (>24hrs) due to surgical blood loss or risk of bleeding:  yes  Please fax a copy of this op note to my office at [phone] (please only include page 1 and 2 of the Case Information op note)

## 2023-08-30 NOTE — Progress Notes (Signed)
 Orthopedic Tech Progress Note Patient Details:  Kathleen Peters July 19, 1951 [id number]  Ortho Devices Type of Ortho Device: Bone foam zero knee Ortho Device/Splint Location: RLE Ortho Device/Splint Interventions: Ordered, Application, Adjustment   Post Interventions Patient Tolerated: Well Instructions Provided: Care of device  [name] A Wonda 08/30/2023, 12:40 PM

## 2023-08-30 NOTE — Anesthesia Preprocedure Evaluation (Addendum)
 Anesthesia Evaluation  Patient identified by MRN, date of birth, ID band Patient awake    Reviewed: Allergy & Precautions, NPO status , Patient's Chart, lab work & pertinent test results, reviewed documented beta blocker date and time   Airway Mallampati: III  TM Distance: <3 FB Neck ROM: Full    Dental  (+) Teeth Intact, Dental Advisory Given   Pulmonary neg pulmonary ROS   Pulmonary exam normal breath sounds clear to auscultation       Cardiovascular hypertension, Pt. on home beta blockers (-) angina (-) Past MI Normal cardiovascular exam Rhythm:Regular Rate:Normal     Neuro/Psych  Headaches  negative psych ROS   GI/Hepatic negative GI ROS, Neg liver ROS,,,  Endo/Other   Hyperthyroidism   Renal/GU negative Renal ROS     Musculoskeletal  (+) Arthritis ,  Right knee failed TKA    Abdominal   Peds  Hematology  (+) Blood dyscrasia, anemia Heterozygous factor V Leiden mutation Plt 244k   Anesthesia Other Findings Day of surgery medications reviewed with the patient.  Reproductive/Obstetrics                             Anesthesia Physical Anesthesia Plan  ASA: 2  Anesthesia Plan: Spinal   Post-op Pain Management: Tylenol  PO (pre-op)*, Regional block* and Gabapentin  PO (pre-op)*   Induction: Intravenous  PONV Risk Score and Plan: 2 and TIVA, Treatment may vary due to age or medical condition, Dexamethasone  and Ondansetron   Airway Management Planned: Natural Airway and Simple Face Mask  Additional Equipment:   Intra-op Plan:   Post-operative Plan:   Informed Consent: I have reviewed the patients History and Physical, chart, labs and discussed the procedure including the risks, benefits and alternatives for the proposed anesthesia with the patient or authorized representative who has indicated his/her understanding and acceptance.     Dental advisory given  Plan Discussed with:  CRNA, Anesthesiologist and Surgeon  Anesthesia Plan Comments: (Discussed risks and benefits of and differences between spinal and general. Discussed risks of spinal including headache, backache, failure, bleeding, infection, and nerve damage. Patient consents to spinal. Questions answered. Coagulation studies and platelet count acceptable.)       Anesthesia Quick Evaluation

## 2023-08-30 NOTE — Progress Notes (Signed)
 Physical Therapy Treatment Patient Details Name: Kathleen Peters MRN: [id number] DOB: July 09, 1951 Today's Date: 08/30/2023   History of Present Illness 73 yo female presents to therapy s/p R TKA revision on 08/30/2023 due to failure of conservative measures. Pt PMH includes but is not limited to: R TKA (2022),  L TKA (2024). hyperthyroidism, heterozygous gactor V leiden mutation, LBP, graves dz, and elbow fx repair.    PT Comments   Pt admitted with above diagnosis.  Pt currently with functional limitations due to the deficits listed below (see PT Problem List). Pt in bed when PT returned about 1 hr later to reassess R LE motor control and coordination, pt unable to complete SQC, SLR or quad set and attributed to slow regression of anesthesia. Pt is motivated to go home and requested to get OOB. PT provided education on need for R quad contraction for safety and stability and pt indicated she wanted to see it for herself. Pt required CGA for supine to sit, min A for sit to stand  from EOB and max A for safety, stability and fall risk prevention with weight shift to R with B UE support at RW. Pt required min A for sit to supine for R LE. Pt ed provided on need for more time for the body to process anesthesia and currently not safe to d/c home. PT to return one additional time to determine pt readiness for same day d/c. Pt will benefit from acute skilled PT to increase their independence and safety with mobility to allow discharge.      If plan is discharge home, recommend the following: A little help with walking and/or transfers;A little help with bathing/dressing/bathroom;Assistance with cooking/housework;Assist for transportation;Help with stairs or ramp for entrance   Can travel by private vehicle        Equipment Recommendations  None recommended by PT    Recommendations for Other Services       Precautions / Restrictions Precautions Precautions: Knee;Fall Restrictions Weight Bearing  Restrictions Per Provider Order: No     Mobility  Bed Mobility Overal bed mobility: Needs Assistance Bed Mobility: Supine to Sit, Sit to Supine     Supine to sit: Contact guard, HOB elevated Sit to supine: Min assist, HOB elevated   General bed mobility comments: pt required cues for supine to sit and min A for R LE for sit to supine    Transfers Overall transfer level: Needs assistance Equipment used: Rolling walker (2 wheels) Transfers: Sit to/from Stand Sit to Stand: Min assist           General transfer comment: pt required min A for sit to stand, however pt unable to accept weight on R LE due to R LE instabiltiy due to slow regression of anethesia and unable to progress to SPT nor gait tasks with pt weight shifting to the R and required max A for fall prevention    Ambulation/Gait               General Gait Details: NT for pt and staff safety at this time   Stairs             Wheelchair Mobility     Tilt Bed    Modified Rankin (Stroke Patients Only)       Balance  Cognition Arousal: Alert Behavior During Therapy: WFL for tasks assessed/performed Overall Cognitive Status: Within Functional Limits for tasks assessed                                          Exercises Total Joint Exercises Ankle Circles/Pumps: AROM, Both, 10 reps Quad Sets: AAROM, Right, 5 reps Short Arc Quad: PROM, Right, 5 reps Heel Slides: AROM, Right, 5 reps Hip ABduction/ADduction: AAROM, Right, 5 reps Straight Leg Raises: PROM, Right, 5 reps    General Comments        Pertinent Vitals/Pain Pain Assessment Pain Assessment: 0-10 Pain Score: 3  Pain Location: R knee Pain Descriptors / Indicators: Aching, Constant, Discomfort, Dull Pain Intervention(s): Limited activity within patient's tolerance, Monitored during session, Premedicated before session, Repositioned, Ice applied     Home Living Family/patient expects to be discharged to:: Private residence Living Arrangements: Alone Available Help at Discharge: Family (sister will help at home) Type of Home: House Home Access: Stairs to enter Entrance Stairs-Rails: Left Entrance Stairs-Number of Steps: 3   Home Layout: One level Home Equipment: Agricultural Consultant (2 wheels);Cane - single point      Prior Function            PT Goals (current goals can now be found in the care plan section) Acute Rehab PT Goals Patient Stated Goal: to be able to walk for fitness > 2 miles and return to golf PT Goal Formulation: With patient Time For Goal Achievement: 09/13/23 Potential to Achieve Goals: Good Progress towards PT goals: Not progressing toward goals - comment    Frequency    7X/week      PT Plan      Co-evaluation              AM-PAC PT 6 Clicks Mobility   Outcome Measure  Help needed turning from your back to your side while in a flat bed without using bedrails?: A Little Help needed moving from lying on your back to sitting on the side of a flat bed without using bedrails?: Total Help needed moving to and from a bed to a chair (including a wheelchair)?: Total Help needed standing up from a chair using your arms (e.g., wheelchair or bedside chair)?: Total Help needed to walk in hospital room?: Total Help needed climbing 3-5 steps with a railing? : Total 6 Click Score: 8    End of Session   Activity Tolerance: Other (comment) (pt limited due to slow regresion of anethesia impacing R LE motor controll and coordination) Patient left: in bed;with call bell/phone within reach;with family/visitor present Nurse Communication: Mobility status;Other (comment) (pt is not ready for same day d/c at this time) PT Visit Diagnosis: Unsteadiness on feet (R26.81);Other abnormalities of gait and mobility (R26.89);Muscle weakness (generalized) (M62.81);Difficulty in walking, not elsewhere classified  (R26.2);Pain Pain - Right/Left: Right Pain - part of body: Knee;Leg     Time: 8383-8369 PT Time Calculation (min) (ACUTE ONLY): 14 min  Charges:    $Therapeutic Exercise: 8-22 mins $Therapeutic Activity: 8-22 mins PT General Charges $$ ACUTE PT VISIT: 1 Visit                     [place], PT Acute Rehab    [place] VEAR Drone 08/30/2023, 5:37 PM

## 2023-08-30 NOTE — Progress Notes (Signed)
 Physical Therapy Treatment Patient Details Name: Kathleen Peters MRN: [id number] DOB: May 26, 1951 Today's Date: 08/30/2023   History of Present Illness 73 yo female presents to therapy s/p R TKA revision on 08/30/2023 due to failure of conservative measures. Pt PMH includes but is not limited to: R TKA (2022),  L TKA (2024). hyperthyroidism, heterozygous gactor V leiden mutation, LBP, graves dz, and elbow fx repair.    PT Comments  Pt admitted with above diagnosis.  Pt currently with functional limitations due to the deficits listed below (see PT Problem List). Pt in bed when PT returned 1.5 hrs to reassess pt for same day d/c. No change in pt R LE/quad motor control or coordination and pt unsafe at this time to transition home. Pt and family ed provided and nursing staff made aware and to communicate to Dr. Rubie. Pt will benefit from acute skilled PT to increase their independence and safety with mobility to allow discharge.      If plan is discharge home, recommend the following: A little help with walking and/or transfers;A little help with bathing/dressing/bathroom;Assistance with cooking/housework;Assist for transportation;Help with stairs or ramp for entrance   Can travel by private vehicle        Equipment Recommendations  None recommended by PT    Recommendations for Other Services       Precautions / Restrictions Precautions Precautions: Knee;Fall Restrictions Weight Bearing Restrictions Per Provider Order: No     Mobility  Bed Mobility Overal bed mobility: Needs Assistance Bed Mobility: Supine to Sit, Sit to Supine     Supine to sit: Contact guard, HOB elevated Sit to supine: Min assist, HOB elevated   General bed mobility comments: pt required cues for supine to sit and min A for R LE for sit to supine    Transfers Overall transfer level: Needs assistance Equipment used: Rolling walker (2 wheels) Transfers: Sit to/from Stand Sit to Stand: Min assist            General transfer comment: pt required min A for sit to stand, however pt unable to accept weight on R LE due to R LE instabiltiy due to slow regression of anethesia and unable to progress to SPT nor gait tasks with pt weight shifting to the R and required max A for fall prevention    Ambulation/Gait               General Gait Details: NT for pt and staff safety at this time   Stairs             Wheelchair Mobility     Tilt Bed    Modified Rankin (Stroke Patients Only)       Balance                                            Cognition Arousal: Alert Behavior During Therapy: WFL for tasks assessed/performed Overall Cognitive Status: Within Functional Limits for tasks assessed                                          Exercises Total Joint Exercises Ankle Circles/Pumps: AROM, Both, 10 reps Quad Sets: AAROM, Right, 5 reps Short Arc Quad: PROM, Right, 5 reps Heel Slides: AROM, Right, 5 reps Hip ABduction/ADduction: AAROM, Right,  5 reps Straight Leg Raises: PROM, Right, 5 reps    General Comments        Pertinent Vitals/Pain Pain Assessment Pain Assessment: 0-10 Pain Score: 2  Pain Location: R knee Pain Descriptors / Indicators: Aching, Constant, Discomfort, Dull Pain Intervention(s): Limited activity within patient's tolerance, Monitored during session, Premedicated before session, Repositioned (pt declined ice, CP within reach)    Home Living Family/patient expects to be discharged to:: Private residence Living Arrangements: Alone Available Help at Discharge: Family (sister will help at home) Type of Home: House Home Access: Stairs to enter Entrance Stairs-Rails: Left Entrance Stairs-Number of Steps: 3   Home Layout: One level Home Equipment: Agricultural Consultant (2 wheels);Cane - single point      Prior Function            PT Goals (current goals can now be found in the care plan section) Acute Rehab PT  Goals Patient Stated Goal: to be able to walk for fitness > 2 miles and return to golf PT Goal Formulation: With patient Time For Goal Achievement: 09/13/23 Potential to Achieve Goals: Good Progress towards PT goals: Progressing toward goals    Frequency    7X/week      PT Plan      Co-evaluation              AM-PAC PT 6 Clicks Mobility   Outcome Measure  Help needed turning from your back to your side while in a flat bed without using bedrails?: A Little Help needed moving from lying on your back to sitting on the side of a flat bed without using bedrails?: Total Help needed moving to and from a bed to a chair (including a wheelchair)?: Total Help needed standing up from a chair using your arms (e.g., wheelchair or bedside chair)?: Total Help needed to walk in hospital room?: Total Help needed climbing 3-5 steps with a railing? : Total 6 Click Score: 8    End of Session   Activity Tolerance: Other (comment) (pt limited due to slow regresion of anethesia impacing R LE motor controll and coordination) Patient left: in bed;with call bell/phone within reach;with family/visitor present Nurse Communication: Mobility status;Other (comment) (pt is not ready for same day d/c at this time and anticipate pt will transition to 3rd floor for overnight observation) PT Visit Diagnosis: Unsteadiness on feet (R26.81);Other abnormalities of gait and mobility (R26.89);Muscle weakness (generalized) (M62.81);Difficulty in walking, not elsewhere classified (R26.2);Pain Pain - Right/Left: Right Pain - part of body: Knee;Leg     Time: 1800-1809 PT Time Calculation (min) (ACUTE ONLY): 9 min  Charges:    $Therapeutic Exercise: 8-22 mins $Therapeutic Activity: 8-22 mins PT General Charges $$ ACUTE PT VISIT: 1 Visit                     [place], PT Acute Rehab    Kathleen Peters 08/30/2023, 6:24 PM

## 2023-08-30 NOTE — Transfer of Care (Signed)
 Immediate Anesthesia Transfer of Care Note  Patient: Kathleen Peters  Procedure(s) Performed: Left Knee Synovectomy and Polyethelyne Exchange (Right: Knee)  Patient Location: PACU  Anesthesia Type:MAC and Spinal  Level of Consciousness: awake, alert , and oriented  Airway & Oxygen Therapy: Patient Spontanous Breathing  Post-op Assessment: Report given to RN  Post vital signs: Reviewed and stable  Last Vitals:  Vitals Value Taken Time  BP 102/58 08/30/23 1153  Temp    Pulse 76 08/30/23 1154  Resp 15 08/30/23 1154  SpO2 98 % 08/30/23 1154  Vitals shown include unfiled device data.  Last Pain:  Vitals:   08/30/23 0852  TempSrc:   PainSc: 0-No pain         Complications: No notable events documented.

## 2023-08-31 ENCOUNTER — Other Ambulatory Visit: Payer: Self-pay

## 2023-08-31 ENCOUNTER — Encounter (HOSPITAL_COMMUNITY): Payer: Self-pay | Admitting: Orthopedic Surgery

## 2023-08-31 MED ORDER — ONDANSETRON 4 MG PO TBDP: 4.0000 mg | ORAL_TABLET | Freq: Three times a day (TID) | ORAL | 0 refills | Status: AC | PRN

## 2023-08-31 NOTE — Care Management Obs Status (Signed)
 MEDICARE OBSERVATION STATUS NOTIFICATION   Patient Details  Name: Kathleen Peters MRN: [id number] Date of Birth: 1951/07/01   Medicare Observation Status Notification Given:  Yes    Amada Jupiter, LCSW 08/31/2023, 1:11 PM

## 2023-08-31 NOTE — Plan of Care (Signed)
  Problem: Education: Goal: Knowledge of General Education information will improve Description: Including pain rating scale, medication(s)/side effects and non-pharmacologic comfort measures Outcome: Adequate for Discharge   Problem: Health Behavior/Discharge Planning: Goal: Ability to manage health-related needs will improve Outcome: Adequate for Discharge   Problem: Clinical Measurements: Goal: Ability to maintain clinical measurements within normal limits will improve Outcome: Adequate for Discharge Goal: Will remain free from infection Outcome: Adequate for Discharge Goal: Diagnostic test results will improve Outcome: Adequate for Discharge Goal: Respiratory complications will improve Outcome: Adequate for Discharge Goal: Cardiovascular complication will be avoided Outcome: Adequate for Discharge   Problem: Activity: Goal: Risk for activity intolerance will decrease Outcome: Adequate for Discharge   Problem: Coping: Goal: Level of anxiety will decrease Outcome: Adequate for Discharge   Problem: Elimination: Goal: Will not experience complications related to bowel motility Outcome: Adequate for Discharge   Problem: Pain Management: Goal: General experience of comfort will improve Outcome: Adequate for Discharge   Problem: Safety: Goal: Ability to remain free from injury will improve Outcome: Adequate for Discharge   Problem: Skin Integrity: Goal: Risk for impaired skin integrity will decrease Outcome: Adequate for Discharge   Problem: Education: Goal: Knowledge of the prescribed therapeutic regimen will improve Outcome: Adequate for Discharge   Problem: Activity: Goal: Ability to avoid complications of mobility impairment will improve Outcome: Adequate for Discharge Goal: Range of joint motion will improve Outcome: Adequate for Discharge   Problem: Clinical Measurements: Goal: Postoperative complications will be avoided or minimized Outcome: Adequate for  Discharge   Problem: Pain Management: Goal: Pain level will decrease with appropriate interventions Outcome: Adequate for Discharge   Problem: Skin Integrity: Goal: Will show signs of wound healing Outcome: Adequate for Discharge

## 2023-08-31 NOTE — Plan of Care (Signed)
  Problem: Education: Goal: Knowledge of General Education information will improve Description: Including pain rating scale, medication(s)/side effects and non-pharmacologic comfort measures Outcome: Progressing   Problem: Health Behavior/Discharge Planning: Goal: Ability to manage health-related needs will improve Outcome: Progressing   Problem: Clinical Measurements: Goal: Ability to maintain clinical measurements within normal limits will improve Outcome: Progressing Goal: Will remain free from infection Outcome: Progressing Goal: Diagnostic test results will improve Outcome: Progressing Goal: Respiratory complications will improve Outcome: Progressing Goal: Cardiovascular complication will be avoided Outcome: Progressing   Problem: Activity: Goal: Risk for activity intolerance will decrease Outcome: Progressing   Problem: Nutrition: Goal: Adequate nutrition will be maintained Outcome: Completed/Met   Problem: Coping: Goal: Level of anxiety will decrease Outcome: Progressing   Problem: Elimination: Goal: Will not experience complications related to bowel motility Outcome: Progressing Goal: Will not experience complications related to urinary retention Outcome: Completed/Met   Problem: Pain Management: Goal: General experience of comfort will improve Outcome: Progressing   Problem: Safety: Goal: Ability to remain free from injury will improve Outcome: Progressing   Problem: Skin Integrity: Goal: Risk for impaired skin integrity will decrease Outcome: Adequate for Discharge   Problem: Education: Goal: Knowledge of the prescribed therapeutic regimen will improve Outcome: Progressing Goal: Individualized Educational Video(s) Outcome: Completed/Met   Problem: Activity: Goal: Ability to avoid complications of mobility impairment will improve Outcome: Progressing Goal: Range of joint motion will improve Outcome: Adequate for Discharge   Problem: Clinical  Measurements: Goal: Postoperative complications will be avoided or minimized Outcome: Progressing   Problem: Pain Management: Goal: Pain level will decrease with appropriate interventions Outcome: Progressing   Problem: Skin Integrity: Goal: Will show signs of wound healing Outcome: Progressing

## 2023-08-31 NOTE — Progress Notes (Signed)
 Physical Therapy Treatment Patient Details Name: Kathleen Peters MRN: [id number] DOB: Jun 14, 1951 Today's Date: 08/31/2023   History of Present Illness 73 yo female presents to therapy s/p R TKA revision on 08/30/2023 due to failure of conservative measures. Pt PMH includes but is not limited to: R TKA (2022),  L TKA (2024). hyperthyroidism, heterozygous gactor V leiden mutation, LBP, graves dz, and elbow fx repair.    PT Comments  Kathleen Peters is a 73 y.o. female POD 0 s/p R TKA. Patient reports IND with mobility at baseline. Patient is now limited by functional impairments (see PT problem list below) and is mod I for bed mobility and CGA for transfers. Patient was able to ambulate 100 feet x 2  with RW and CGA progressing to close S level of assist. Patient instructed in exercise to facilitate ROM and circulation to manage edema reviewed and HO provided. Pt instruction provided on step navigation, fall risk prevention, use of CP/ice, pain management and goals pt demonstrated verbal understanding. Sister arrived at end of tx session. Pt is ready for d/c home with family support from PT standpoint. If pt remains in hospital will benefit from continued skilled PT interventions to address impairments and progress towards PLOF.    If plan is discharge home, recommend the following: A little help with walking and/or transfers;A little help with bathing/dressing/bathroom;Assistance with cooking/housework;Assist for transportation;Help with stairs or ramp for entrance   Can travel by private vehicle        Equipment Recommendations  None recommended by PT    Recommendations for Other Services       Precautions / Restrictions Precautions Precautions: Knee;Fall Restrictions Weight Bearing Restrictions Per Provider Order: Yes Other Position/Activity Restrictions: R LE WBAT     Mobility  Bed Mobility Overal bed mobility: Modified Independent Bed Mobility: Supine to Sit     Supine to sit: HOB  elevated, Modified independent (Device/Increase time)          Transfers Overall transfer level: Needs assistance Equipment used: Rolling walker (2 wheels) Transfers: Sit to/from Stand Sit to Stand: Contact guard assist           General transfer comment: min cues, CGA for bed and commode transfer    Ambulation/Gait Ambulation/Gait assistance: Contact guard assist, Supervision Gait Distance (Feet): 100 Feet Assistive device: Rolling walker (2 wheels) Gait Pattern/deviations: Step-to pattern, Antalgic Gait velocity: decreased     General Gait Details: slight L toe in, step almost through pattern and min cues for safety, initally mild R LE instability noted and pt required CGA and able to progress to close S and instructed on use of B UE support at RW to offload R LE in stance phase   Stairs Stairs: Yes Stairs assistance: Contact guard assist Stair Management: Two rails Number of Stairs: 3 General stair comments: min cues for technique safety and sequencing, descending 7 inch step mild R Knee instability x 1   Wheelchair Mobility     Tilt Bed    Modified Rankin (Stroke Patients Only)       Balance Overall balance assessment: Mild deficits observed, not formally tested                                          Cognition Arousal: Alert Behavior During Therapy: WFL for tasks assessed/performed Overall Cognitive Status: Within Functional Limits for tasks assessed  Exercises Total Joint Exercises Ankle Circles/Pumps: AROM, Both, 10 reps Quad Sets: Right, AROM, 10 reps Short Arc Quad: Right, AROM, 10 reps Heel Slides: AROM, Right, 10 reps Hip ABduction/ADduction: Right, AROM, 10 reps Straight Leg Raises: Right, AROM, 10 reps Knee Flexion: AROM, Right, 10 reps, Seated    General Comments        Pertinent Vitals/Pain Pain Assessment Pain Assessment: 0-10 Pain Score: 4  Pain  Location: R knee Pain Descriptors / Indicators: Aching, Constant, Discomfort, Dull, Operative site guarding Pain Intervention(s): Limited activity within patient's tolerance, Monitored during session, Premedicated before session, Repositioned, Ice applied    Home Living                          Prior Function            PT Goals (current goals can now be found in the care plan section) Acute Rehab PT Goals Patient Stated Goal: to be able to walk for fitness > 2 miles and return to golf PT Goal Formulation: With patient Time For Goal Achievement: 09/13/23 Potential to Achieve Goals: Good Progress towards PT goals: Progressing toward goals    Frequency    7X/week      PT Plan      Co-evaluation              AM-PAC PT 6 Clicks Mobility   Outcome Measure  Help needed turning from your back to your side while in a flat bed without using bedrails?: None Help needed moving from lying on your back to sitting on the side of a flat bed without using bedrails?: None Help needed moving to and from a bed to a chair (including a wheelchair)?: A Little Help needed standing up from a chair using your arms (e.g., wheelchair or bedside chair)?: A Little Help needed to walk in hospital room?: A Little Help needed climbing 3-5 steps with a railing? : A Little 6 Click Score: 20    End of Session   Activity Tolerance: Patient tolerated treatment well;No increased pain Patient left: with call bell/phone within reach;with family/visitor present;in chair Nurse Communication: Mobility status;Other (comment) (pt readiness for d/c from PT standpoint) PT Visit Diagnosis: Unsteadiness on feet (R26.81);Other abnormalities of gait and mobility (R26.89);Muscle weakness (generalized) (M62.81);Difficulty in walking, not elsewhere classified (R26.2);Pain Pain - Right/Left: Right Pain - part of body: Knee;Leg     Time: 8987-8958 PT Time Calculation (min) (ACUTE ONLY): 29  min  Charges:    $Gait Training: 8-22 mins $Therapeutic Exercise: 8-22 mins PT General Charges $$ ACUTE PT VISIT: 1 Visit                     [place], PT Acute Rehab    [place] VEAR Drone 08/31/2023, 10:50 AM

## 2023-08-31 NOTE — Care Management Important Message (Signed)
 Important Message  Patient Details IM Letter given. Name: Kathleen Peters MRN: [id number] Date of Birth: Mar 18, 1951   Important Message Given:  Yes - Medicare IM     Caren Macadam 08/31/2023, 8:35 AM

## 2023-08-31 NOTE — Discharge Summary (Signed)
 SPORTS MEDICINE & JOINT REPLACEMENT   Garnette Raman, MD   Laurell Simpers, PA-C [address] [facility], [place], [place]  [address]                             [phone]  PATIENT ID: Kathleen Peters        MRN:  [id number]          DOB/AGE: 1950/09/28 / 73 y.o.    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  After appropriate laboratory studies were obtained  they were taken to the operating room on 08/30/2023 and underwent Procedure(s): Left Knee Synovectomy and Polyethelyne Exchange.   They were given perioperative antibiotics:  Anti-infectives (From admission, onward)    Start     Dose/Rate Route Frequency Ordered Stop   08/30/23 0830  ceFAZolin  (ANCEF ) IVPB 2g/100 mL premix        2 g 200 mL/hr over 30 Minutes Intravenous On call to O.R. 08/30/23 0827 08/30/23 1034     .  Patient given tranexamic acid  IV or topical and exparel  intra-operatively.  Tolerated the procedure well.    POD# 1: Vital signs were stable.  Patient denied Chest pain, shortness of breath, or calf pain.  Patient was started on Aspirin  twice daily at 8am.  Consults to PT, OT, and care management were made.  The patient was weight bearing as tolerated.  CPM  was placed on the operative leg 0-90 degrees for 6-8 hours a day. When out of the CPM, patient was placed in the foam block to achieve full extension. Incentive spirometry was taught.  Dressing was changed.       POD #2, Continued  PT for ambulation and exercise program.  IV saline locked.  O2 discontinued.    The remainder of the hospital course was dedicated to ambulation and strengthening.   The patient was discharged on 1 Day Post-Op in  Good condition.  Blood products given:none  DIAGNOSTIC STUDIES: Recent vital signs: Patient Vitals for the past 24 hrs:  BP Temp Temp src Pulse Resp SpO2 Height Weight  08/31/23 1014 105/64 98 F (36.7 C) Oral 65 18 100 % -- --  08/31/23 0534 100/61 98.1 F (36.7 C) Oral (!) 58 18 96 % -- --  08/31/23 0120 (!) 102/56 97.7 F (36.5 C) Oral (!) 54 17 96 % -- --  08/30/23 2155 104/61 98.4 F (36.9 C) Oral (!) 55 18 96 % -- --  08/30/23 2035 -- -- -- -- -- -- 5' 4 (1.626 m) 64 kg  08/30/23 1855 126/74 (!) 97.4 F (36.3 C) Oral (!) 52 18 100 % -- --  08/30/23 1838 122/64 -- -- (!) 56 18 98 % -- --  08/30/23 1729 (!) 113/58 -- -- 72 -- 100 % -- --  08/30/23 1707 (!) 105/58 -- -- -- -- 94 % -- --  08/30/23 1400 118/68 -- -- (!) 50 14 96 % -- --  08/30/23 1323 110/71 -- -- (!) 50 -- 97 % -- --  08/30/23 1251 -- (!) 97.5 F (36.4 C) Temporal -- 16 96 % -- --  08/30/23 1245 121/65 -- -- (!) 56 12 98 % -- --  08/30/23 1242 -- (!) 97.5 F (36.4 C) -- -- -- -- -- --       Recent laboratory studies: Recent Labs    08/30/23 0900  WBC 3.6*  HGB 10.0*  HCT 31.4*  PLT 244   Recent Labs    08/30/23 0900  NA 135  K 4.0  CL 102  CO2 26  BUN 20  CREATININE 1.20*  GLUCOSE 81  CALCIUM 8.7*   No results found for: INR, PROTIME   Recent Radiographic Studies :  No results found.  DISCHARGE INSTRUCTIONS: Discharge Instructions     Call MD / Call 911   Complete by: As directed    If you experience chest pain or shortness of breath, CALL  911 and be transported to the hospital emergency room.  If you develope a fever above 101 F, pus (white drainage) or increased drainage or redness at the wound, or calf pain, call your surgeon's office.   Constipation Prevention   Complete by: As directed    Drink plenty of fluids.  Prune juice may be helpful.  You may use a stool softener, such as Colace (over the counter) 100 mg twice a day.  Use MiraLax  (over the counter) for constipation as needed.   Diet - low sodium heart healthy   Complete by: As directed    Increase activity slowly as tolerated   Complete by: As directed    Post-operative opioid taper instructions:   Complete by: As directed    POST-OPERATIVE OPIOID TAPER INSTRUCTIONS: It is important to wean off of your opioid medication as soon as possible. If you do not need pain medication after your surgery it is ok to stop day one. Opioids include: Codeine, Hydrocodone (Norco, Vicodin), Oxycodone (Percocet, oxycontin ) and hydromorphone  amongst others.  Long term and even short term use of opiods can cause: Increased pain response Dependence Constipation Depression Respiratory depression And more.  Withdrawal symptoms can include Flu like symptoms Nausea, vomiting And more Techniques to manage these symptoms Hydrate well Eat regular healthy meals Stay active Use relaxation techniques(deep breathing, meditating, yoga) Do Not substitute Alcohol to help with tapering If you have been on opioids for less than two weeks and do not have pain than it is ok to stop all together.  Plan to wean off of opioids This plan should start within one week post op of your joint replacement. Maintain the same interval or time between taking each dose and first decrease the dose.  Cut the total daily intake of opioids by one tablet each day Next start to increase the time between doses. The last dose that should be eliminated is the evening dose.          DISCHARGE MEDICATIONS:    Allergies as of 08/31/2023   No Known Allergies      Medication List  STOP taking these medications    aspirin  81 MG chewable tablet Replaced by: aspirin  EC 81 MG tablet   gabapentin  300 MG capsule Commonly known as: NEURONTIN    HYDROcodone -acetaminophen  5-325 MG tablet Commonly known as: NORCO/VICODIN   ibuprofen  800 MG tablet Commonly known as: ADVIL    lidocaine  5 % Commonly known as: Lidoderm    methylPREDNISolone  4 MG tablet Commonly known as: Medrol    predniSONE  50 MG tablet Commonly known as: DELTASONE    traMADol  50 MG tablet Commonly known as: ULTRAM        TAKE these medications    acetaminophen  650 MG CR tablet Commonly known as: TYLENOL  Take 650 mg by mouth every 8 (eight) hours as needed for pain.   aspirin  EC 81 MG tablet Take 1 tablet (81 mg total) by mouth 2 (two) times daily. Swallow whole. Replaces: aspirin  81 MG chewable tablet   atenolol  25 MG tablet Commonly known as: TENORMIN  Take 25 mg by mouth every other day.   eletriptan  40 MG tablet Commonly known as: RELPAX  Take 40 mg by mouth as needed for migraine.   estradiol  2 MG tablet Commonly known as: ESTRACE  Take 2 mg by mouth daily.   Fish Oil 1000 MG Caps Take 1,000 mg by mouth daily.   GLUCOSAMINE-CHONDROITIN PO Take 2 tablets by mouth 2 (two) times a week.   LORazepam  1 MG tablet Commonly known as: ATIVAN  Take 1 mg by mouth at bedtime as needed for sleep.   methimazole  10 MG tablet Commonly known as: TAPAZOLE  Take 1 tablet (10 mg total) by mouth daily. What changed: when to take this   methocarbamol  500 MG tablet Commonly known as: ROBAXIN  Take 1-2 tablets (500-1,000 mg total) by mouth 4 (four) times daily. What changed:  how much to take when to take this reasons to take this   Multi-Vitamins Tabs Take 1 tablet by mouth daily.   naproxen sodium 220 MG tablet Commonly known as: ALEVE Take 220 mg by mouth daily as needed (PAIN).   ondansetron  4 MG  disintegrating tablet Commonly known as: ZOFRAN -ODT Take 1 tablet (4 mg total) by mouth every 8 (eight) hours as needed for nausea or vomiting.   oxyCODONE  5 MG immediate release tablet Commonly known as: Oxy IR/ROXICODONE  Take 1-2 tablets (5-10 mg total) by mouth every 6 (six) hours as needed.   Vitamin D  50 MCG (2000 UT) tablet Take 2,000 Units by mouth daily.               Durable Medical Equipment  (From admission, onward)           Start     Ordered   08/30/23 1859  DME 3 n 1  Once        08/30/23 1858   08/30/23 1859  DME Bedside commode  Once       Question:  Patient needs a bedside commode to treat with the following condition  Answer:  S/P total knee replacement   08/30/23 1858   08/30/23 1211  DME Walker rolling  Once       Question:  Patient needs a walker to treat with the following condition  Answer:  S/P total knee replacement   08/30/23 1210            FOLLOW UP VISIT:    DISPOSITION: HOME VS. SNF  Dental Antibiotics:  In most cases prophylactic antibiotics for Dental procdeures after total joint surgery are not necessary.  Exceptions are as follows:  1. History of prior  total joint infection  2. Severely immunocompromised (Organ Transplant, cancer chemotherapy, Rheumatoid biologic meds such as Humera)  3. Poorly controlled diabetes (A1C &gt; 8.0, blood glucose over 200)  If you have one of these conditions, contact your surgeon for an antibiotic prescription, prior to your dental procedure.   CONDITION:  Good   Laurell Dale Simpers 08/31/2023, 12:33 PM

## 2023-08-31 NOTE — Anesthesia Postprocedure Evaluation (Signed)
 Anesthesia Post Note  Patient: Kathleen Peters  Procedure(s) Performed: Left Knee Synovectomy and Polyethelyne Exchange (Right: Knee)     Patient location during evaluation: PACU Anesthesia Type: Spinal Level of consciousness: awake, awake and alert and oriented Pain management: pain level controlled Vital Signs Assessment: post-procedure vital signs reviewed and stable Respiratory status: spontaneous breathing, nonlabored ventilation and respiratory function stable Cardiovascular status: blood pressure returned to baseline and stable Postop Assessment: no headache, no backache, spinal receding and no apparent nausea or vomiting Anesthetic complications: no   No notable events documented.  Last Vitals:  Vitals:   08/31/23 0120 08/31/23 0534  BP: (!) 102/56 100/61  Pulse: (!) 54 (!) 58  Resp: 17 18  Temp: 36.5 C 36.7 C  SpO2: 96% 96%    Last Pain:  Vitals:   08/31/23 0815  TempSrc:   PainSc: 3                  Garnette FORBES [place]

## 2023-08-31 NOTE — TOC Transition Note (Signed)
 Transition of Care [facility]) - Discharge Note   Patient Details  Name: Kathleen Peters MRN: [id number] Date of Birth: Feb 10, 1951  [facility] of [facility] [place]) CM/SW Contact:  NORMAN ASPEN, LCSW Phone Number: 08/31/2023, 10:42 AM   Clinical Narrative:     Met with pt who confirms she has needed DME in the home.  OPPT already arranged with [facility] PT.  No further TOC needs.   Final next level of care: OP Rehab Barriers to Discharge: No Barriers Identified   Patient Goals and CMS Choice Patient states their goals for this hospitalization and ongoing recovery are:: return home          Discharge Placement                       Discharge Plan and Services Additional resources added to the After Visit Summary for                  DME Arranged: N/A DME Agency: NA                  Social Drivers of Health (SDOH) Interventions SDOH Screenings   Food Insecurity: No Food Insecurity (08/30/2023)  Housing: Low Risk  (08/30/2023)  Transportation Needs: No Transportation Needs (08/30/2023)  Utilities: Not At Risk (08/30/2023)  Social Connections: Moderately Isolated (08/30/2023)  Tobacco Use: Low Risk  (08/30/2023)     Readmission Risk Interventions    08/31/2023   10:41 AM  Readmission Risk Prevention Plan  Post Dischage Appt Complete  Medication Screening Complete  Transportation Screening Complete

## 2023-09-09 DIAGNOSIS — Z96651 Presence of right artificial knee joint: Secondary | ICD-10-CM | POA: Diagnosis not present

## 2023-09-09 DIAGNOSIS — M25561 Pain in right knee: Secondary | ICD-10-CM | POA: Diagnosis not present

## 2023-09-14 DIAGNOSIS — M25561 Pain in right knee: Secondary | ICD-10-CM | POA: Diagnosis not present

## 2023-09-16 DIAGNOSIS — M25561 Pain in right knee: Secondary | ICD-10-CM | POA: Diagnosis not present

## 2023-09-21 DIAGNOSIS — M25561 Pain in right knee: Secondary | ICD-10-CM | POA: Diagnosis not present

## 2023-09-24 DIAGNOSIS — M25561 Pain in right knee: Secondary | ICD-10-CM | POA: Diagnosis not present

## 2023-09-28 DIAGNOSIS — M25561 Pain in right knee: Secondary | ICD-10-CM | POA: Diagnosis not present

## 2023-09-30 DIAGNOSIS — M25561 Pain in right knee: Secondary | ICD-10-CM | POA: Diagnosis not present

## 2023-10-01 DIAGNOSIS — E0590 Thyrotoxicosis, unspecified without thyrotoxic crisis or storm: Secondary | ICD-10-CM | POA: Diagnosis not present

## 2023-10-05 DIAGNOSIS — Z85828 Personal history of other malignant neoplasm of skin: Secondary | ICD-10-CM | POA: Diagnosis not present

## 2023-10-05 DIAGNOSIS — L905 Scar conditions and fibrosis of skin: Secondary | ICD-10-CM | POA: Diagnosis not present

## 2023-10-05 DIAGNOSIS — D2239 Melanocytic nevi of other parts of face: Secondary | ICD-10-CM | POA: Diagnosis not present

## 2023-10-05 DIAGNOSIS — D225 Melanocytic nevi of trunk: Secondary | ICD-10-CM | POA: Diagnosis not present

## 2023-10-05 DIAGNOSIS — L814 Other melanin hyperpigmentation: Secondary | ICD-10-CM | POA: Diagnosis not present

## 2023-10-05 DIAGNOSIS — L821 Other seborrheic keratosis: Secondary | ICD-10-CM | POA: Diagnosis not present

## 2023-10-05 DIAGNOSIS — L570 Actinic keratosis: Secondary | ICD-10-CM | POA: Diagnosis not present

## 2023-10-06 DIAGNOSIS — M25561 Pain in right knee: Secondary | ICD-10-CM | POA: Diagnosis not present

## 2023-10-08 DIAGNOSIS — M25561 Pain in right knee: Secondary | ICD-10-CM | POA: Diagnosis not present

## 2023-10-19 DIAGNOSIS — M25561 Pain in right knee: Secondary | ICD-10-CM | POA: Diagnosis not present

## 2024-01-03 DIAGNOSIS — E0590 Thyrotoxicosis, unspecified without thyrotoxic crisis or storm: Secondary | ICD-10-CM | POA: Diagnosis not present

## 2024-01-31 DIAGNOSIS — E0590 Thyrotoxicosis, unspecified without thyrotoxic crisis or storm: Secondary | ICD-10-CM | POA: Diagnosis not present

## 2024-03-07 DIAGNOSIS — Z808 Family history of malignant neoplasm of other organs or systems: Secondary | ICD-10-CM | POA: Diagnosis not present

## 2024-03-07 DIAGNOSIS — E039 Hypothyroidism, unspecified: Secondary | ICD-10-CM | POA: Diagnosis not present

## 2024-03-07 DIAGNOSIS — E0500 Thyrotoxicosis with diffuse goiter without thyrotoxic crisis or storm: Secondary | ICD-10-CM | POA: Diagnosis not present

## 2024-03-09 DIAGNOSIS — Z96651 Presence of right artificial knee joint: Secondary | ICD-10-CM | POA: Diagnosis not present

## 2024-03-13 DIAGNOSIS — Z13820 Encounter for screening for osteoporosis: Secondary | ICD-10-CM | POA: Diagnosis not present

## 2024-03-13 DIAGNOSIS — Z6824 Body mass index (BMI) 24.0-24.9, adult: Secondary | ICD-10-CM | POA: Diagnosis not present

## 2024-03-13 DIAGNOSIS — E0590 Thyrotoxicosis, unspecified without thyrotoxic crisis or storm: Secondary | ICD-10-CM | POA: Diagnosis not present

## 2024-03-13 DIAGNOSIS — M8588 Other specified disorders of bone density and structure, other site: Secondary | ICD-10-CM | POA: Diagnosis not present

## 2024-03-13 DIAGNOSIS — Z1231 Encounter for screening mammogram for malignant neoplasm of breast: Secondary | ICD-10-CM | POA: Diagnosis not present

## 2024-05-19 DIAGNOSIS — Z96651 Presence of right artificial knee joint: Secondary | ICD-10-CM | POA: Diagnosis not present

## 2024-06-20 DIAGNOSIS — M25562 Pain in left knee: Secondary | ICD-10-CM | POA: Diagnosis not present

## 2024-06-20 DIAGNOSIS — H02055 Trichiasis without entropian left lower eyelid: Secondary | ICD-10-CM | POA: Diagnosis not present

## 2024-06-20 DIAGNOSIS — Z961 Presence of intraocular lens: Secondary | ICD-10-CM | POA: Diagnosis not present

## 2024-06-20 DIAGNOSIS — M25561 Pain in right knee: Secondary | ICD-10-CM | POA: Diagnosis not present
